# Patient Record
Sex: Male | Born: 1980 | Race: White | Hispanic: No | Marital: Married | Smoking: Current every day smoker
Health system: Southern US, Community
[De-identification: ages and names within clinical notes are randomized; demographics above are authoritative.]

## PROBLEM LIST (undated history)

## (undated) DIAGNOSIS — F1410 Cocaine abuse, uncomplicated: Secondary | ICD-10-CM

## (undated) DIAGNOSIS — Z8614 Personal history of Methicillin resistant Staphylococcus aureus infection: Secondary | ICD-10-CM

## (undated) DIAGNOSIS — K219 Gastro-esophageal reflux disease without esophagitis: Secondary | ICD-10-CM

## (undated) DIAGNOSIS — I10 Essential (primary) hypertension: Secondary | ICD-10-CM

## (undated) DIAGNOSIS — F1010 Alcohol abuse, uncomplicated: Secondary | ICD-10-CM

## (undated) DIAGNOSIS — Z7189 Other specified counseling: Secondary | ICD-10-CM

## (undated) HISTORY — DX: Gastro-esophageal reflux disease without esophagitis: K21.9

## (undated) HISTORY — PX: BAND HEMORRHOIDECTOMY: SHX1213

## (undated) HISTORY — PX: APPENDECTOMY: SHX54

## (undated) HISTORY — PX: OTHER SURGICAL HISTORY: SHX169

## (undated) HISTORY — PX: CARPAL TUNNEL RELEASE: SHX101

---

## 2014-07-20 ENCOUNTER — Emergency Department: Payer: Self-pay | Admitting: Emergency Medicine

## 2014-07-20 LAB — CBC WITH DIFFERENTIAL/PLATELET
Basophil #: 0.1 10*3/uL (ref 0.0–0.1)
Basophil %: 0.5 %
EOS PCT: 2.1 %
Eosinophil #: 0.2 10*3/uL (ref 0.0–0.7)
HCT: 42.8 % (ref 40.0–52.0)
HGB: 13.7 g/dL (ref 13.0–18.0)
LYMPHS PCT: 14.8 %
Lymphocyte #: 1.7 10*3/uL (ref 1.0–3.6)
MCH: 27.2 pg (ref 26.0–34.0)
MCHC: 32 g/dL (ref 32.0–36.0)
MCV: 85 fL (ref 80–100)
Monocyte #: 0.5 x10 3/mm (ref 0.2–1.0)
Monocyte %: 4.7 %
Neutrophil #: 8.8 10*3/uL — ABNORMAL HIGH (ref 1.4–6.5)
Neutrophil %: 77.9 %
Platelet: 184 10*3/uL (ref 150–440)
RBC: 5.03 10*6/uL (ref 4.40–5.90)
RDW: 15.1 % — ABNORMAL HIGH (ref 11.5–14.5)
WBC: 11.2 10*3/uL — ABNORMAL HIGH (ref 3.8–10.6)

## 2014-07-20 LAB — COMPREHENSIVE METABOLIC PANEL
ALBUMIN: 3.5 g/dL (ref 3.4–5.0)
ANION GAP: 10 (ref 7–16)
Alkaline Phosphatase: 88 U/L
BUN: 16 mg/dL (ref 7–18)
Bilirubin,Total: 1 mg/dL (ref 0.2–1.0)
Calcium, Total: 8.6 mg/dL (ref 8.5–10.1)
Chloride: 104 mmol/L (ref 98–107)
Co2: 24 mmol/L (ref 21–32)
Creatinine: 1 mg/dL (ref 0.60–1.30)
EGFR (Non-African Amer.): >60
Glucose: 96 mg/dL (ref 65–99)
OSMOLALITY: 277 (ref 275–301)
Potassium: 3.5 mmol/L (ref 3.5–5.1)
SGOT(AST): 38 U/L — ABNORMAL HIGH (ref 15–37)
SGPT (ALT): 40 U/L
Sodium: 138 mmol/L (ref 136–145)
Total Protein: 6.9 g/dL (ref 6.4–8.2)

## 2014-07-20 LAB — LIPASE, BLOOD: Lipase: 87 U/L (ref 73–393)

## 2014-07-20 LAB — URINALYSIS, COMPLETE
BILIRUBIN, UR: NEGATIVE
BLOOD: NEGATIVE
Bacteria: NONE SEEN
Glucose,UR: NEGATIVE mg/dL (ref 0–75)
Leukocyte Esterase: NEGATIVE
Nitrite: NEGATIVE
PH: 7 (ref 4.5–8.0)
Protein: 30
RBC,UR: 3 /HPF (ref 0–5)
Specific Gravity: 1.023 (ref 1.003–1.030)
WBC UR: <1 /HPF (ref 0–5)

## 2015-03-25 ENCOUNTER — Encounter: Payer: Self-pay | Admitting: Emergency Medicine

## 2015-03-25 ENCOUNTER — Emergency Department
Admission: EM | Admit: 2015-03-25 | Discharge: 2015-03-25 | Disposition: A | Discharge status: Home / Self Care | Payer: Medicaid Other | Attending: Emergency Medicine | Admitting: Emergency Medicine

## 2015-03-25 DIAGNOSIS — F10930 Alcohol use, unspecified with withdrawal, uncomplicated: Secondary | ICD-10-CM

## 2015-03-25 DIAGNOSIS — F10230 Alcohol dependence with withdrawal, uncomplicated: Secondary | ICD-10-CM | POA: Diagnosis present

## 2015-03-25 DIAGNOSIS — F1210 Cannabis abuse, uncomplicated: Secondary | ICD-10-CM | POA: Diagnosis not present

## 2015-03-25 DIAGNOSIS — F419 Anxiety disorder, unspecified: Secondary | ICD-10-CM | POA: Insufficient documentation

## 2015-03-25 DIAGNOSIS — Z720 Tobacco use: Secondary | ICD-10-CM | POA: Insufficient documentation

## 2015-03-25 HISTORY — DX: Alcohol abuse, uncomplicated: F10.10

## 2015-03-25 LAB — CBC WITH DIFFERENTIAL/PLATELET
Basophils Absolute: 0.1 10*3/uL (ref 0–0.1)
Basophils Relative: 1 %
Eosinophils Absolute: 0 10*3/uL (ref 0–0.7)
Eosinophils Relative: 0 %
HCT: 43.9 % (ref 40.0–52.0)
Hemoglobin: 15 g/dL (ref 13.0–18.0)
LYMPHS ABS: 1.8 10*3/uL (ref 1.0–3.6)
LYMPHS PCT: 15 %
MCH: 29.9 pg (ref 26.0–34.0)
MCHC: 34.3 g/dL (ref 32.0–36.0)
MCV: 87.2 fL (ref 80.0–100.0)
Monocytes Absolute: 0.7 10*3/uL (ref 0.2–1.0)
Monocytes Relative: 6 %
NEUTROS PCT: 78 %
Neutro Abs: 9.5 10*3/uL — ABNORMAL HIGH (ref 1.4–6.5)
Platelets: 232 10*3/uL (ref 150–440)
RBC: 5.03 MIL/uL (ref 4.40–5.90)
RDW: 16.1 % — ABNORMAL HIGH (ref 11.5–14.5)
WBC: 12.1 10*3/uL — AB (ref 3.8–10.6)

## 2015-03-25 LAB — COMPREHENSIVE METABOLIC PANEL
ALT: 39 U/L (ref 17–63)
ANION GAP: 9 (ref 5–15)
AST: 29 U/L (ref 15–41)
Albumin: 4.1 g/dL (ref 3.5–5.0)
Alkaline Phosphatase: 58 U/L (ref 38–126)
BUN: 32 mg/dL — ABNORMAL HIGH (ref 6–20)
CHLORIDE: 104 mmol/L (ref 101–111)
CO2: 26 mmol/L (ref 22–32)
Calcium: 8.9 mg/dL (ref 8.9–10.3)
Creatinine, Ser: 0.92 mg/dL (ref 0.61–1.24)
GFR calc non Af Amer: >60 mL/min (ref >60)
Glucose, Bld: 99 mg/dL (ref 65–99)
Potassium: 3.8 mmol/L (ref 3.5–5.1)
SODIUM: 139 mmol/L (ref 135–145)
Total Bilirubin: 1.2 mg/dL (ref 0.3–1.2)
Total Protein: 6.8 g/dL (ref 6.5–8.1)

## 2015-03-25 LAB — URINE DRUG SCREEN, QUALITATIVE (ARMC ONLY)
Amphetamines, Ur Screen: NOT DETECTED
Barbiturates, Ur Screen: NOT DETECTED
Benzodiazepine, Ur Scrn: NOT DETECTED
CANNABINOID 50 NG, UR ~~LOC~~: POSITIVE — AB
COCAINE METABOLITE, UR ~~LOC~~: NOT DETECTED
MDMA (ECSTASY) UR SCREEN: NOT DETECTED
Methadone Scn, Ur: NOT DETECTED
OPIATE, UR SCREEN: NOT DETECTED
PHENCYCLIDINE (PCP) UR S: NOT DETECTED
Tricyclic, Ur Screen: NOT DETECTED

## 2015-03-25 LAB — ETHANOL: Alcohol, Ethyl (B): <5 mg/dL (ref <5)

## 2015-03-25 MED ORDER — LORAZEPAM 2 MG/ML IJ SOLN: 0.0000 mg | Freq: Two times a day (BID) | INTRAMUSCULAR | Status: DC

## 2015-03-25 MED ORDER — LORAZEPAM 2 MG PO TABS
0.0000 mg | ORAL_TABLET | Freq: Four times a day (QID) | ORAL | Status: DC
  Filled 2015-03-25: qty 1

## 2015-03-25 MED ORDER — LORAZEPAM 2 MG/ML IJ SOLN: 0.0000 mg | Freq: Four times a day (QID) | INTRAMUSCULAR | Status: DC

## 2015-03-25 MED ORDER — VITAMIN B-1 100 MG PO TABS
100.0000 mg | ORAL_TABLET | Freq: Every day | ORAL | Status: DC
  Administered 2015-03-25: 100 mg via ORAL
  Filled 2015-03-25 (×2): qty 1

## 2015-03-25 MED ORDER — LORAZEPAM 2 MG PO TABS: 0.0000 mg | ORAL_TABLET | Freq: Two times a day (BID) | ORAL | Status: DC

## 2015-03-25 MED ORDER — ONDANSETRON 4 MG PO TBDP
4.0000 mg | ORAL_TABLET | Freq: Once | ORAL | Status: AC
  Administered 2015-03-25: 4 mg via ORAL
  Filled 2015-03-25: qty 1

## 2015-03-25 MED ORDER — THIAMINE HCL 100 MG/ML IJ SOLN: 100.0000 mg | Freq: Every day | INTRAMUSCULAR | Status: DC

## 2015-03-25 MED ORDER — LORAZEPAM 2 MG/ML IJ SOLN
1.0000 mg | Freq: Once | INTRAMUSCULAR | Status: AC
  Administered 2015-03-25: 1 mg via INTRAMUSCULAR
  Filled 2015-03-25: qty 1

## 2015-03-25 NOTE — ED Notes (Signed)
States history of ETOH abuse, states feels like he is ETOH withdrawel, states last drink 3 hours ago

## 2015-03-25 NOTE — Discharge Instructions (Signed)
Alcohol Withdrawal °Alcohol withdrawal happens when you normally drink alcohol a lot and suddenly stop drinking. Alcohol withdrawal symptoms can be mild to very bad. Mild withdrawal symptoms can include feeling sick to your stomach (nauseous), headache, or feeling irritable. Bad withdrawal symptoms can include shakiness, being very nervous (anxious), and not thinking clearly.  °HOME CARE °· Join an alcohol support group. °· Stay away from people or situations that make you want to drink. °· Eat a healthy diet. Eat a lot of fresh fruits, vegetables, and lean meats. °GET HELP RIGHT AWAY IF:  °· You become confused. You start to see and hear things that are not really there. °· You feel your heart beating very fast. °· You throw up (vomit) blood or cannot stop throwing up. This may be bright red or look like black coffee grounds. °· You have blood in your poop (stool). This may be bright red, maroon colored, or black and tarry. °· You are lightheaded or pass out (faint). °· You develop a fever. °MAKE SURE YOU:  °· Understand these instructions. °· Will watch your condition. °· Will get help right away if you are not doing well or get worse. °Document Released: 12/11/2007 Document Revised: 09/16/2011 Document Reviewed: 12/11/2007 °ExitCare® Patient Information ©2015 [employer]. This information is not intended to replace advice given to you by your health care provider. Make sure you discuss any questions you have with your health care provider. ° °

## 2015-03-25 NOTE — ED Provider Notes (Signed)
[facility] Emergency Department Provider Note  Time seen: 3:53 PM  I have reviewed the triage vital signs and the nursing notes.   HISTORY  Chief Complaint Anxiety    HPI Guy Rodriguez is a 34 y.o. male with a past medical history of alcohol abuse who presents the emergency room department requesting alcohol detox. According to the patient he drinks one fifth of vodka per day. His last drink was earlier this morning. Patient states he feels very shaky, tremulous, anxious, nauseated. Denies any history of seizures or complicated withdrawal. Patient states he has never tried inpatient detox but he states his alcoholism has put strains on his family relationships and he is hoping to detox off alcohol. He admits cannabinoids use, but denies any other substances. No medical complaints currently besides nausea. Denies abdominal or chest pain.    Past Medical History  Diagnosis Date  . ETOH abuse     There are no active problems to display for this patient.   Past Surgical History  Procedure Laterality Date  . Appendectomy      No current outpatient prescriptions on file.  Allergies Review of patient's allergies indicates no known allergies.  No family history on file.  Social History Social History  Substance Use Topics  . Smoking status: Current Every Day Smoker -- 0.50 packs/day    Types: Cigarettes  . Smokeless tobacco: None  . Alcohol Use: Yes    Review of Systems Constitutional: Negative for fever. Cardiovascular: Negative for chest pain. Respiratory: Negative for shortness of breath. Gastrointestinal: Negative for abdominal pain. Positive for nausea. Genitourinary: Negative for dysuria. Neurological: Negative for headache 10-point ROS otherwise negative.  ____________________________________________   PHYSICAL EXAM:  VITAL SIGNS: ED Triage Vitals  Enc Vitals Group     BP 03/25/15 1404 139/107 mmHg     Pulse Rate 03/25/15 1404  86     Resp 03/25/15 1404 20     Temp 03/25/15 1404 97.8 F (36.6 C)     Temp Source 03/25/15 1404 Oral     SpO2 03/25/15 1404 99 %     Weight 03/25/15 1404 195 lb (88.451 kg)     Height 03/25/15 1404  (1.854 m)     Head Cir --      Peak Flow --      Pain Score 03/25/15 1405 0     Pain Loc --      Pain Edu? --      Excl. in GC? --     Constitutional: Alert and oriented. Does appear anxious. Eyes: Normal exam ENT   Head: Normocephalic and atraumatic Cardiovascular: Normal rate, regular rhythm. No murmur Respiratory: Normal respiratory effort without tachypnea nor retractions. Breath sounds are clear  Gastrointestinal: Soft and nontender. No distention.   Musculoskeletal: Nontender with normal range of motion in all extremities. Neurologic:  Normal speech and language. No gross focal neurologic deficits  Skin:  Skin is warm, dry and intact.  Psychiatric: Moderately anxious appearing.     INITIAL IMPRESSION / ASSESSMENT AND PLAN / ED COURSE  Pertinent labs & imaging results that were available during my care of the patient were reviewed by me and considered in my medical decision making (see chart for details).  Patient presents for alcohol detox last withdrawal. States nausea but denies any other medical complaints. We will treat nausea, place on CIWA protocol and monitor closely in the emergency department. I have consult did behavioral health to evaluate for possible detox. Labs are  largely within normal limits.  Behavioral health has arranged for the patient to go to [facility] for detox. Patient is agreeable. Labs are largely within normal limits. Alcohol is negative. We'll discharge the patient to [facility] continued detox and treatment.  ____________________________________________   FINAL CLINICAL IMPRESSION(S) / ED DIAGNOSES  Alcohol withdrawal   Minna Antis, MD 03/25/15 2030

## 2015-03-25 NOTE — BH Assessment (Signed)
Assessment Note  Guy Rodriguez is an 34 y.o. male. Patient reports coming into the ED requesting alcohol detox.  Patient currently denies suicidal/homicidal ideations, hallucinations, and other self-injurious behaviors.  Patient reports drinking alcohol daily, fifth to a liter of vodka or 3-4 Locos, started at age 71 but this daily use for the past 3 years, last used 9/16. Patient reports smoking marijuana, couple times a month, 1 blunt, since the age of 75, and last used 2 days ago.  The current withdrawal symptoms are shakes, sweats, nausea, vomiting, dizzie, numbness, anxious, diarrhea, and restlessness.  Patient denies history of dt's and seizures.    This Clinical research associate consulted with Dr. Lenard Lance it was recommended to refer for inpatient detox.  This Clinical research associate faxed referral to [facility] for review and spoke with Alinda Money, Charity fundraiser.    Axis I: Alcohol use, severe Axis II: Deferred Axis III:  Past Medical History  Diagnosis Date  . ETOH abuse    Axis IV: economic problems, housing problems, occupational problems, other psychosocial or environmental problems, problems related to social environment, problems with access to health care services and problems with primary support group Axis V: 51-60 moderate symptoms  Past Medical History:  Past Medical History  Diagnosis Date  . ETOH abuse     Past Surgical History  Procedure Laterality Date  . Appendectomy      Family History: No family history on file.  Social History:  reports that he has been smoking Cigarettes.  He has been smoking about 0.50 packs per day. He does not have any smokeless tobacco history on file. He reports that he drinks alcohol. His drug history is not on file.  Additional Social History:  Alcohol / Drug Use Pain Medications: See MARs Prescriptions: See MARs Over the Counter: See MARs History of alcohol / drug use?: Yes Longest period of sobriety (when/how long): couple weeks Negative Consequences of Use: Financial, Legal,  Personal relationships, Work / School Withdrawal Symptoms: Diarrhea, Sweats, Fever / Chills, Tingling, Tremors, Irritability, Nausea / Vomiting, Weakness, Patient aware of relationship between substance abuse and physical/medical complications Substance #1 Name of Substance 1: Alcohol 1 - Age of First Use: 12 1 - Amount (size/oz): fifth or liter 1 - Frequency: daily 1 - Duration: 2-3 years 1 - Last Use / Amount: 9/16 Substance #2 Name of Substance 2: Marijuana 2 - Age of First Use: 12 2 - Amount (size/oz): blunt 2 - Frequency: 2 or more a month 2 - Duration: on/off since 12 2 - Last Use / Amount: 2 days ago  CIWA: CIWA-Ar BP: 128/87 mmHg Pulse Rate: 80 Nausea and Vomiting: 3 Tactile Disturbances: moderate itching, pins and needles, burning or numbness Tremor: three Auditory Disturbances: not present Paroxysmal Sweats: barely perceptible sweating, palms moist Visual Disturbances: very mild sensitivity Anxiety: mildly anxious Headache, Fullness in Head: very mild Agitation: normal activity Orientation and Clouding of Sensorium: cannot do serial additions or is uncertain about date CIWA-Ar Total: 14 COWS:    Allergies: No Known Allergies  Home Medications:  (Not in a hospital admission)  OB/GYN Status:  No LMP for male patient.  General Assessment Data Location of Assessment: [facility] ED [facility] Assessment: In system Is this a Tele or Face-to-Face Assessment?: Face-to-Face Is this an Initial Assessment or a Re-assessment for this encounter?: Initial Assessment Marital status: Married [place] name: na Is patient pregnant?: No Pregnancy Status: No Living Arrangements: Other (Comment) (homeless) Can pt return to current living arrangement?: Yes Admission Status: Voluntary Is patient capable of signing  voluntary admission?: Yes Referral Source: Self/Family/Friend Insurance type: medicaid  Medical Screening Exam [facility] Walk-in ONLY) Medical Exam completed: Yes  Crisis Care  Plan Living Arrangements: Other (Comment) (homeless) Name of Psychiatrist: none Name of Therapist: none  Education Status Is patient currently in school?: No Current Grade: na Highest grade of school patient has completed: na Name of school: na Contact person: na  Risk to self with the past 6 months Suicidal Ideation: No-Not Currently/Within Last 6 Months Has patient been a risk to self within the past 6 months prior to admission? : No Suicidal Intent: No-Not Currently/Within Last 6 Months Has patient had any suicidal intent within the past 6 months prior to admission? : No Is patient at risk for suicide?: No Suicidal Plan?: No-Not Currently/Within Last 6 Months Has patient had any suicidal plan within the past 6 months prior to admission? : No Access to Means: No What has been your use of drugs/alcohol within the last 12 months?: alcohol, marijuana Previous Attempts/Gestures: No How many times?: 0 Intentional Self Injurious Behavior: None Family Suicide History: No Recent stressful life event(s): Conflict (Comment), Loss (Comment), Job Loss, Financial Problems, Legal Issues, Other (Comment) Persecutory voices/beliefs?: No Depression Symptoms: Loss of interest in usual pleasures, Guilt, Feeling worthless/self pity Substance abuse history and/or treatment for substance abuse?: Yes  Risk to Others within the past 6 months Homicidal Ideation: No-Not Currently/Within Last 6 Months Does patient have any lifetime risk of violence toward others beyond the six months prior to admission? : No Thoughts of Harm to Others: No-Not Currently Present/Within Last 6 Months Current Homicidal Intent: No-Not Currently/Within Last 6 Months Current Homicidal Plan: No-Not Currently/Within Last 6 Months Access to Homicidal Means: No Identified Victim: na History of harm to others?: No Assessment of Violence: None Noted Violent Behavior Description: na Does patient have access to weapons?:  No Criminal Charges Pending?: No Does patient have a court date: No Is patient on probation?: No  Psychosis Hallucinations: None noted Delusions: None noted  Mental Status Report Appearance/Hygiene: In hospital gown Eye Contact: Fair Motor Activity: Freedom of movement Speech: Slow, Slurred Level of Consciousness: Alert Mood: Pleasant Affect: Appropriate to circumstance Anxiety Level: Minimal Thought Processes: Relevant Judgement: Unimpaired Orientation: Person, Place, Time, Situation Obsessive Compulsive Thoughts/Behaviors: None  Cognitive Functioning Concentration: Fair Memory: Recent Intact, Remote Intact IQ: Average Insight: Fair Impulse Control: Fair Appetite: Poor Weight Loss: 0 Weight Gain: 0 Sleep: No Change Vegetative Symptoms: None  ADLScreening [facility] Assessment Services) Patient's cognitive ability adequate to safely complete daily activities?: Yes Patient able to express need for assistance with ADLs?: Yes Independently performs ADLs?: Yes (appropriate for developmental age)  Prior Inpatient Therapy Prior Inpatient Therapy: No Prior Therapy Dates: na  Prior Outpatient Therapy Prior Outpatient Therapy: No Does patient have an ACCT team?: No Does patient have Intensive In-House Services?  : No Does patient have [facility] services? : No Does patient have P4CC services?: No  ADL Screening (condition at time of admission) Patient's cognitive ability adequate to safely complete daily activities?: Yes Patient able to express need for assistance with ADLs?: Yes Independently performs ADLs?: Yes (appropriate for developmental age)       Abuse/Neglect Assessment (Assessment to be complete while patient is alone) Physical Abuse: Denies Verbal Abuse: Denies Sexual Abuse: Denies Exploitation of patient/patient's resources: Denies Self-Neglect: Denies Values / Beliefs Cultural Requests During Hospitalization: None Spiritual Requests During  Hospitalization: None Consults Spiritual Care Consult Needed: No Social Work Consult Needed: No Merchant navy officer (For Healthcare) Does patient  have an advance directive?: No Would patient like information on creating an advanced directive?: No - patient declined information    Additional Information 1:1 In Past 12 Months?: No CIRT Risk: No Elopement Risk: No Does patient have medical clearance?: Yes     Disposition:  Disposition Initial Assessment Completed for this Encounter: Yes Disposition of Patient: Inpatient treatment program Type of inpatient treatment program: Adult  On Site Evaluation by:   Reviewed with Physician:    Maryelizabeth Rowan A 03/25/2015 6:41 PM

## 2015-03-25 NOTE — Progress Notes (Signed)
This Clinical research associate spoke with Alinda Money, RN who has confirmed the patient has been accepted and will be transported as soon as possible.  The nursing staff was informed that [facility] will contact when transportation is confirmed.     Maryelizabeth Rowan, MSW, [facility] Triage Specialist [phone]

## 2015-08-05 ENCOUNTER — Encounter: Payer: Self-pay | Admitting: Emergency Medicine

## 2015-08-05 ENCOUNTER — Ambulatory Visit
Admission: EM | Admit: 2015-08-05 | Discharge: 2015-08-05 | Disposition: A | Discharge status: Home / Self Care | Payer: Medicaid Other | Attending: Family Medicine | Admitting: Family Medicine

## 2015-08-05 ENCOUNTER — Ambulatory Visit: Payer: Medicaid Other

## 2015-08-05 DIAGNOSIS — Z7982 Long term (current) use of aspirin: Secondary | ICD-10-CM | POA: Insufficient documentation

## 2015-08-05 DIAGNOSIS — F17210 Nicotine dependence, cigarettes, uncomplicated: Secondary | ICD-10-CM | POA: Insufficient documentation

## 2015-08-05 DIAGNOSIS — M940 Chondrocostal junction syndrome [Tietze]: Secondary | ICD-10-CM | POA: Insufficient documentation

## 2015-08-05 DIAGNOSIS — R079 Chest pain, unspecified: Secondary | ICD-10-CM | POA: Insufficient documentation

## 2015-08-05 DIAGNOSIS — I10 Essential (primary) hypertension: Secondary | ICD-10-CM | POA: Diagnosis not present

## 2015-08-05 DIAGNOSIS — R0789 Other chest pain: Secondary | ICD-10-CM | POA: Diagnosis not present

## 2015-08-05 HISTORY — DX: Other specified counseling: Z71.89

## 2015-08-05 HISTORY — DX: Essential (primary) hypertension: I10

## 2015-08-05 LAB — TROPONIN I: Troponin I: <0.03 ng/mL (ref <0.031)

## 2015-08-05 LAB — COMPREHENSIVE METABOLIC PANEL
ALK PHOS: 69 U/L (ref 38–126)
ALT: 35 U/L (ref 17–63)
ANION GAP: 11 (ref 5–15)
AST: 30 U/L (ref 15–41)
Albumin: 4.6 g/dL (ref 3.5–5.0)
BILIRUBIN TOTAL: 1.9 mg/dL — AB (ref 0.3–1.2)
BUN: 20 mg/dL (ref 6–20)
CALCIUM: 9.2 mg/dL (ref 8.9–10.3)
CO2: 24 mmol/L (ref 22–32)
CREATININE: 0.99 mg/dL (ref 0.61–1.24)
Chloride: 99 mmol/L — ABNORMAL LOW (ref 101–111)
GFR calc non Af Amer: >60 mL/min (ref >60)
Glucose, Bld: 112 mg/dL — ABNORMAL HIGH (ref 65–99)
Potassium: 4.1 mmol/L (ref 3.5–5.1)
Sodium: 134 mmol/L — ABNORMAL LOW (ref 135–145)
TOTAL PROTEIN: 7.9 g/dL (ref 6.5–8.1)

## 2015-08-05 LAB — CBC WITH DIFFERENTIAL/PLATELET
Basophils Absolute: 0.1 10*3/uL (ref 0–0.1)
Basophils Relative: 1 %
EOS ABS: 0.2 10*3/uL (ref 0–0.7)
Eosinophils Relative: 3 %
HEMATOCRIT: 46.7 % (ref 40.0–52.0)
HEMOGLOBIN: 16.3 g/dL (ref 13.0–18.0)
LYMPHS ABS: 1.9 10*3/uL (ref 1.0–3.6)
LYMPHS PCT: 31 %
MCH: 31.5 pg (ref 26.0–34.0)
MCHC: 34.9 g/dL (ref 32.0–36.0)
MCV: 90.4 fL (ref 80.0–100.0)
MONOS PCT: 10 %
Monocytes Absolute: 0.6 10*3/uL (ref 0.2–1.0)
NEUTROS ABS: 3.3 10*3/uL (ref 1.4–6.5)
NEUTROS PCT: 55 %
Platelets: 208 10*3/uL (ref 150–440)
RBC: 5.17 MIL/uL (ref 4.40–5.90)
RDW: 13.9 % (ref 11.5–14.5)
WBC: 6.1 10*3/uL (ref 3.8–10.6)

## 2015-08-05 LAB — CK: Total CK: 102 U/L (ref 49–397)

## 2015-08-05 LAB — CKMB (ARMC ONLY): CK, MB: 1.3 ng/mL (ref 0.5–5.0)

## 2015-08-05 MED ORDER — MELOXICAM 15 MG PO TABS: 15.0000 mg | ORAL_TABLET | Freq: Every day | ORAL | Status: DC

## 2015-08-05 MED ORDER — KETOROLAC TROMETHAMINE 60 MG/2ML IM SOLN
60.0000 mg | Freq: Once | INTRAMUSCULAR | Status: AC
  Administered 2015-08-05: 60 mg via INTRAMUSCULAR

## 2015-08-05 NOTE — ED Notes (Signed)
Rib pain, shoulder pain, back pain all on left side for 7 days. Worst in pass 3 days

## 2015-08-05 NOTE — ED Provider Notes (Signed)
CSN: [id number]     Arrival date & time 08/05/15  1152 History   First MD Initiated Contact with Patient 08/05/15 1256     Nurses notes were reviewed. Chief Complaint  Patient presents with  . Chest Pain  . Shoulder Pain   Patient reports having extreme chest pain. Chest pain but on the left side left lower ribs. He reports pain becomes worse when he takes a deep breath. It was the pain has been there since Monday is taking better fitting things getting worse. He denies any injury to his left side of the left ribs but reports the pain is more intense. He is diet any type of injury. He is aware of and denies any type of extreme activity because he is out of work at this time. He does smoke and was warned he needs to stop smoking. He is receiving started Chantix and his wife was concerned that that may have been causing his pain on the left side. I have explained to them this in usual for the Chantix only affect a slight amount of area and not the lungs chest. He denies any type of URI. No history of asthma or previous lung problem.  He does have strong family history of heart disease but denies any history of hypertension. One of his parents had her first MI in the late 28s.        (Consider location/radiation/quality/duration/timing/severity/associated sxs/prior Treatment) Patient is a 35 y.o. male presenting with chest pain and shoulder pain. The history is provided by the patient and the spouse. No language interpreter was used.  Chest Pain Pain location:  L chest and L lateral chest Pain quality: sharp, shooting and stabbing   Pain quality: not radiating   Pain radiates to:  Does not radiate Pain radiates to the back: no   Pain severity:  Severe Duration:  6 days Timing:  Constant Progression:  Worsening Chronicity:  New Context: breathing, movement and raising an arm   Relieved by:  Nothing Worsened by:  Certain positions Ineffective treatments:  Certain positions Associated  symptoms: back pain   Associated symptoms: no cough, no diaphoresis, no dizziness, no dysphagia, no fatigue, no fever, no heartburn, no lower extremity edema and no shortness of breath   Risk factors: male sex and smoking   Risk factors: no prior DVT/PE   Shoulder Pain Associated symptoms: back pain   Associated symptoms: no fatigue and no fever     Past Medical History  Diagnosis Date  . ETOH abuse   . Hypertension   . History of participation in smoking cessation counseling    Past Surgical History  Procedure Laterality Date  . Appendectomy     No family history on file. Social History  Substance Use Topics  . Smoking status: Current Every Day Smoker -- 0.50 packs/day    Types: Cigarettes  . Smokeless tobacco: None  . Alcohol Use: Yes    Review of Systems  Constitutional: Negative for fever, diaphoresis and fatigue.  HENT: Negative for trouble swallowing.   Respiratory: Negative for cough and shortness of breath.   Cardiovascular: Positive for chest pain.  Gastrointestinal: Negative for heartburn.  Musculoskeletal: Positive for back pain.  Neurological: Negative for dizziness.    Allergies  Review of patient's allergies indicates no known allergies.  Home Medications   Prior to Admission medications   Medication Sig Start Date End Date Taking? Authorizing Provider  amoxicillin (AMOXIL) 500 MG tablet Take 500 mg by mouth 2 (two)  times daily.   Yes Historical Provider, MD  gabapentin (NEURONTIN) 600 MG tablet Take 600 mg by mouth 3 (three) times daily.   Yes Historical Provider, MD  lisinopril (PRINIVIL,ZESTRIL) 10 MG tablet Take 10 mg by mouth daily.   Yes Historical Provider, MD  varenicline (CHANTIX PAK) 0.5 MG X 11 & 1 MG X 42 tablet Take by mouth 2 (two) times daily. Take one 0.5 mg tablet by mouth once daily for 3 days, then increase to one 0.5 mg tablet twice daily for 4 days, then increase to one 1 mg tablet twice daily.   Yes Historical Provider, MD  aspirin  EC 81 MG tablet Take 81 mg by mouth daily.    Historical Provider, MD   Meds Ordered and Administered this Visit  Medications - No data to display  BP 145/92 mmHg  Pulse 80  Temp(Src) 98.6 F (37 C) (Tympanic)  Resp 18  Ht  (1.854 m)  Wt 210 lb (95.255 kg)  BMI 27.71 kg/m2  SpO2 97% No data found.   Physical Exam  Constitutional: He is oriented to person, place, and time. He appears well-developed and well-nourished.  HENT:  Head: Normocephalic and atraumatic.  Eyes: Conjunctivae are normal. Pupils are equal, round, and reactive to light.  Neck: Normal range of motion. Neck supple.  Cardiovascular: Normal rate, regular rhythm and normal heart sounds.   Pulmonary/Chest: Effort normal and breath sounds normal. No respiratory distress. He exhibits tenderness and bony tenderness.    Patient with marked tenderness over the left chest wall especially lower left ribs and increase tenderness as we approach the axillary area  Abdominal: Soft. Normal appearance and bowel sounds are normal. There is no hepatosplenomegaly. There is tenderness. There is no CVA tenderness. No hernia.    Tenderness over the abdominal area is really over the left lower ribs.  Musculoskeletal: Normal range of motion. He exhibits tenderness. He exhibits no edema.  Neurological: He is alert and oriented to person, place, and time.  Skin: Skin is warm and dry.  Psychiatric: He has a normal mood and affect.  Vitals reviewed.   ED Course  Procedures (including critical care time)  Labs Review Labs Reviewed  COMPREHENSIVE METABOLIC PANEL - Abnormal; Notable for the following:    Sodium 134 (*)    Chloride 99 (*)    Glucose, Bld 112 (*)    Total Bilirubin 1.9 (*)    All other components within normal limits  TROPONIN I  CBC WITH DIFFERENTIAL/PLATELET  CK  CKMB([facility] ONLY)    Imaging Review Dg Ribs Unilateral W/chest Left  08/05/2015  CLINICAL DATA:  Left lower rib pain for 1 week. No known  trauma. Current smoker. EXAM: LEFT RIBS AND CHEST - 3+ VIEW COMPARISON:  None. FINDINGS: Normal cardiac silhouette and mediastinal contours. No focal parenchymal opacities. No pleural effusion or pneumothorax. No evidence of edema. No acute osseous abnormalities with special attention paid to the area demarcated by the radiopaque BB. IMPRESSION: 1.  No acute cardiopulmonary disease. 2. No displaced rib fractures with special attention paid to the area demarcated by the radiopaque BB. Electronically Signed   By: Simonne Come M.D.   On: 08/05/2015 13:56     Visual Acuity Review  Right Eye Distance:   Left Eye Distance:   Bilateral Distance:    Right Eye Near:   Left Eye Near:    Bilateral Near:         MDM   1. Costochondritis, acute  2. Acute chest wall pain      Results for orders placed or performed during the hospital encounter of 08/05/15  Troponin I  Result Value Ref Range   Troponin I <0.03 <0.031 ng/mL  CBC with Differential  Result Value Ref Range   WBC 6.1 3.8 - 10.6 K/uL   RBC 5.17 4.40 - 5.90 MIL/uL   Hemoglobin 16.3 13.0 - 18.0 g/dL   HCT 16.1 09.6 - 04.5 %   MCV 90.4 80.0 - 100.0 fL   MCH 31.5 26.0 - 34.0 pg   MCHC 34.9 32.0 - 36.0 g/dL   RDW 40.9 81.1 - 91.4 %   Platelets 208 150 - 440 K/uL   Neutrophils Relative % 55 %   Neutro Abs 3.3 1.4 - 6.5 K/uL   Lymphocytes Relative 31 %   Lymphs Abs 1.9 1.0 - 3.6 K/uL   Monocytes Relative 10 %   Monocytes Absolute 0.6 0.2 - 1.0 K/uL   Eosinophils Relative 3 %   Eosinophils Absolute 0.2 0 - 0.7 K/uL   Basophils Relative 1 %   Basophils Absolute 0.1 0 - 0.1 K/uL  Comprehensive metabolic panel  Result Value Ref Range   Sodium 134 (L) 135 - 145 mmol/L   Potassium 4.1 3.5 - 5.1 mmol/L   Chloride 99 (L) 101 - 111 mmol/L   CO2 24 22 - 32 mmol/L   Glucose, Bld 112 (H) 65 - 99 mg/dL   BUN 20 6 - 20 mg/dL   Creatinine, Ser 7.82 0.61 - 1.24 mg/dL   Calcium 9.2 8.9 - 95.6 mg/dL   Total Protein 7.9 6.5 - 8.1 g/dL    Albumin 4.6 3.5 - 5.0 g/dL   AST 30 15 - 41 U/L   ALT 35 17 - 63 U/L   Alkaline Phosphatase 69 38 - 126 U/L   Total Bilirubin 1.9 (H) 0.3 - 1.2 mg/dL   GFR calc non Af Amer >60 >60 mL/min   GFR calc Af Amer >60 >60 mL/min   Anion gap 11 5 - 15  CK  Result Value Ref Range   Total CK 102 49 - 397 U/L  CKMB([facility] only)  Result Value Ref Range   CK, MB 1.3 0.5 - 5.0 ng/mL   Patient given 6 mg of Toradol IM. Chest pain appears to be chest wall pain with tenderness of the left ribs. At this time place him on Mobic 15 mg. We'll also obtain cardiac enzymes on him. Cardiac enzymes chest x-ray negative Him to go home on the Mobic 15 mg daily. PCP of choice in 2 weeks not better. May need to get stress test in the near future chest pain continues     Note: This dictation was prepared with Dragon dictation along with smaller phrase technology. Any transcriptional errors that result from this process are unintentional.  [name], MD 08/05/15 1459

## 2015-08-05 NOTE — Discharge Instructions (Signed)
Chest Wall Pain °Chest wall pain is pain in or around the bones and muscles of your chest. Sometimes, an injury causes this pain. Sometimes, the cause may not be known. This pain may take several weeks or longer to get better. °HOME CARE °Pay attention to any changes in your symptoms. Take these actions to help with your pain: °· Rest as told by your doctor. °· Avoid activities that cause pain. Try not to use your chest, belly (abdominal), or side muscles to lift heavy things. °· If directed, apply ice to the painful area: °¨ Put ice in a plastic bag. °¨ Place a towel between your skin and the bag. °¨ Leave the ice on for 20 minutes, 2-3 times per day. °· Take over-the-counter and prescription medicines only as told by your doctor. °· Do not use tobacco products, including cigarettes, chewing tobacco, and e-cigarettes. If you need help quitting, ask your doctor. °· Keep all follow-up visits as told by your doctor. This is important. °GET HELP IF: °· You have a fever. °· Your chest pain gets worse. °· You have new symptoms. °GET HELP RIGHT AWAY IF: °· You feel sick to your stomach (nauseous) or you throw up (vomit). °· You feel sweaty or light-headed. °· You have a cough with phlegm (sputum) or you cough up blood. °· You are short of breath. °  °This information is not intended to replace advice given to you by your health care provider. Make sure you discuss any questions you have with your health care provider. °  °Document Released: 12/11/2007 Document Revised: 03/15/2015 Document Reviewed: 09/19/2014 °Elsevier Interactive Patient Education ©2016 [employer] ° °Costochondritis °Costochondritis is a condition in which the tissue (cartilage) that connects your ribs with your breastbone (sternum) becomes irritated. It causes pain in the chest and rib area. It usually goes away on its own over time. °HOME CARE °· Avoid activities that wear you out. °· Do not strain your ribs. Avoid activities that use  your: °¨ Chest. °¨ Belly. °¨ Side muscles. °· Put ice on the area for the first 2 days after the pain starts. °¨ Put ice in a plastic bag. °¨ Place a towel between your skin and the bag. °¨ Leave the ice on for 20 minutes, 2-3 times a day. °· Only take medicine as told by your doctor. °GET HELP IF: °· You have redness or puffiness (swelling) in the rib area. °· Your pain does not go away with rest or medicine. °GET HELP RIGHT AWAY IF:  °· Your pain gets worse. °· You are very uncomfortable. °· You have trouble breathing. °· You cough up blood. °· You start sweating or throwing up (vomiting). °· You have a fever or lasting symptoms for more than 2-3 days. °· You have a fever and your symptoms suddenly get worse. °MAKE SURE YOU:  °· Understand these instructions. °· Will watch your condition. °· Will get help right away if you are not doing well or get worse. °  °This information is not intended to replace advice given to you by your health care provider. Make sure you discuss any questions you have with your health care provider. °  °Document Released: 12/11/2007 Document Revised: 02/24/2013 Document Reviewed: 01/26/2013 °Elsevier Interactive Patient Education ©2016 [employer] ° °

## 2015-10-12 ENCOUNTER — Ambulatory Visit: Payer: Medicaid Other

## 2015-10-12 ENCOUNTER — Encounter: Payer: Self-pay | Admitting: Emergency Medicine

## 2015-10-12 ENCOUNTER — Ambulatory Visit
Admission: EM | Admit: 2015-10-12 | Discharge: 2015-10-12 | Disposition: A | Discharge status: Home / Self Care | Payer: Medicaid Other | Attending: Family Medicine | Admitting: Family Medicine

## 2015-10-12 DIAGNOSIS — R0781 Pleurodynia: Secondary | ICD-10-CM | POA: Diagnosis not present

## 2015-10-12 DIAGNOSIS — F17210 Nicotine dependence, cigarettes, uncomplicated: Secondary | ICD-10-CM | POA: Insufficient documentation

## 2015-10-12 DIAGNOSIS — M940 Chondrocostal junction syndrome [Tietze]: Secondary | ICD-10-CM | POA: Diagnosis not present

## 2015-10-12 DIAGNOSIS — Z79899 Other long term (current) drug therapy: Secondary | ICD-10-CM | POA: Insufficient documentation

## 2015-10-12 DIAGNOSIS — W010XXA Fall on same level from slipping, tripping and stumbling without subsequent striking against object, initial encounter: Secondary | ICD-10-CM | POA: Diagnosis not present

## 2015-10-12 DIAGNOSIS — Z7982 Long term (current) use of aspirin: Secondary | ICD-10-CM | POA: Diagnosis not present

## 2015-10-12 DIAGNOSIS — F1010 Alcohol abuse, uncomplicated: Secondary | ICD-10-CM | POA: Diagnosis not present

## 2015-10-12 DIAGNOSIS — Z9889 Other specified postprocedural states: Secondary | ICD-10-CM | POA: Insufficient documentation

## 2015-10-12 DIAGNOSIS — I10 Essential (primary) hypertension: Secondary | ICD-10-CM | POA: Insufficient documentation

## 2015-10-12 DIAGNOSIS — S20212A Contusion of left front wall of thorax, initial encounter: Secondary | ICD-10-CM | POA: Insufficient documentation

## 2015-10-12 MED ORDER — KETOROLAC TROMETHAMINE 60 MG/2ML IM SOLN
60.0000 mg | Freq: Once | INTRAMUSCULAR | Status: AC
  Administered 2015-10-12: 60 mg via INTRAMUSCULAR

## 2015-10-12 MED ORDER — HYDROCODONE-ACETAMINOPHEN 5-325 MG PO TABS: 1.0000 | ORAL_TABLET | Freq: Four times a day (QID) | ORAL | Status: DC | PRN

## 2015-10-12 NOTE — Discharge Instructions (Signed)
Rib Contusion A rib contusion is a deep bruise on your rib area. Contusions are the result of a blunt trauma that causes bleeding and injury to the tissues under the skin. A rib contusion may involve bruising of the ribs and of the skin and muscles in the area. The skin overlying the contusion may turn blue, purple, or yellow. Minor injuries will give you a painless contusion, but more severe contusions may stay painful and swollen for a few weeks. CAUSES  A contusion is usually caused by a blow, trauma, or direct force to an area of the body. This often occurs while playing contact sports. SYMPTOMS  Swelling and redness of the injured area.  Discoloration of the injured area.  Tenderness and soreness of the injured area.  Pain with or without movement. DIAGNOSIS  The diagnosis can be made by taking a medical history and performing a physical exam. An X-ray, CT scan, or MRI may be needed to determine if there were any associated injuries, such as broken bones (fractures) or internal injuries. TREATMENT  Often, the best treatment for a rib contusion is rest. Icing or applying cold compresses to the injured area may help reduce swelling and inflammation. Deep breathing exercises may be recommended to reduce the risk of partial lung collapse and pneumonia. Over-the-counter or prescription medicines may also be recommended for pain control. HOME CARE INSTRUCTIONS   Apply ice to the injured area:  Put ice in a plastic bag.  Place a towel between your skin and the bag.  Leave the ice on for 20 minutes, 2-3 times per day.  Take medicines only as directed by your health care provider.  Rest the injured area. Avoid strenuous activity and any activities or movements that cause pain. Be careful during activities and avoid bumping the injured area.  Perform deep-breathing exercises as directed by your health care provider.  Do not lift anything that is heavier than 5 lb (2.3 kg) until your  health care provider approves.  Do not use any tobacco products, including cigarettes, chewing tobacco, or electronic cigarettes. If you need help quitting, ask your health care provider. SEEK MEDICAL CARE IF:   You have increased bruising or swelling.  You have pain that is not controlled with treatment.  You have a fever. SEEK IMMEDIATE MEDICAL CARE IF:   You have difficulty breathing or shortness of breath.  You develop a continual cough, or you cough up thick or bloody sputum.  You feel sick to your stomach (nauseous), you throw up (vomit), or you have abdominal pain.   This information is not intended to replace advice given to you by your health care provider. Make sure you discuss any questions you have with your health care provider.   Document Released: 03/19/2001 Document Revised: 07/15/2014 Document Reviewed: 04/05/2014 Elsevier Interactive Patient Education 2016 [employer] Inc[employer]  

## 2015-10-12 NOTE — ED Provider Notes (Signed)
CSN: [id number]     Arrival date & time 10/12/15  1015 History   First MD Initiated Contact with Patient 10/12/15 1214     Chief Complaint  Patient presents with  . Rib Pain   . Fall   (Consider location/radiation/quality/duration/timing/severity/associated sxs/prior Treatment) HPI  This is a 35 year old male who presents with bilateral rib pain after slipping on his wet deck yesterday. He was ascending the stairs when he slipped and fell against the railing . He has a large bruise extending on his lower ribs on the left side over into the right anterior ribs. He denies any shortness of breath or coughing blood or sputum. States any motion is very painful and deep breathing is also painful. He was last seen here at the clinic with a costochondritis on the left was nontraumatic.      Past Medical History  Diagnosis Date  . ETOH abuse   . Hypertension   . History of participation in smoking cessation counseling    Past Surgical History  Procedure Laterality Date  . Appendectomy    . Clavicale surgery Left    History reviewed. No pertinent family history. Social History  Substance Use Topics  . Smoking status: Current Every Day Smoker -- 0.50 packs/day    Types: Cigarettes  . Smokeless tobacco: None  . Alcohol Use: Yes    Review of Systems  Constitutional: Positive for activity change. Negative for fever, chills and fatigue.  Respiratory: Negative for cough and shortness of breath.   Cardiovascular: Positive for chest pain.  Skin: Positive for color change.  All other systems reviewed and are negative.   Allergies  Review of patient's allergies indicates no known allergies.  Home Medications   Prior to Admission medications   Medication Sig Start Date End Date Taking? Authorizing Provider  escitalopram (LEXAPRO) 10 MG tablet Take 10 mg by mouth daily.   Yes Historical Provider, MD  ranitidine (ZANTAC) 300 MG tablet Take 300 mg by mouth 2 (two) times daily.   Yes  Historical Provider, MD  amoxicillin (AMOXIL) 500 MG tablet Take 500 mg by mouth 2 (two) times daily.    Historical Provider, MD  aspirin EC 81 MG tablet Take 81 mg by mouth daily.    Historical Provider, MD  gabapentin (NEURONTIN) 600 MG tablet Take 600 mg by mouth 3 (three) times daily.    Historical Provider, MD  HYDROcodone-acetaminophen (NORCO/VICODIN) 5-325 MG tablet Take 1-2 tablets by mouth every 6 (six) hours as needed. 10/12/15   Lutricia Feil, PA-C  lisinopril (PRINIVIL,ZESTRIL) 10 MG tablet Take 10 mg by mouth daily.    Historical Provider, MD  meloxicam (MOBIC) 15 MG tablet Take 1 tablet (15 mg total) by mouth daily. 08/05/15   [name], MD  varenicline (CHANTIX PAK) 0.5 MG X 11 & 1 MG X 42 tablet Take by mouth 2 (two) times daily. Take one 0.5 mg tablet by mouth once daily for 3 days, then increase to one 0.5 mg tablet twice daily for 4 days, then increase to one 1 mg tablet twice daily.    Historical Provider, MD   Meds Ordered and Administered this Visit   Medications  ketorolac (TORADOL) injection 60 mg (60 mg Intramuscular Given 10/12/15 1233)    BP 141/98 mmHg  Pulse 84  Temp(Src) 98 F (36.7 C) (Tympanic)  Resp 16  Ht  (1.854 m)  Wt 215 lb (97.523 kg)  BMI 28.37 kg/m2  SpO2 97% No data found.  Physical Exam  Constitutional: He is oriented to person, place, and time. He appears well-developed and well-nourished. No distress.  Is a distinct smell of alcohol as I enter the room.  HENT:  Head: Normocephalic and atraumatic.  Eyes: Conjunctivae are normal. Pupils are equal, round, and reactive to light.  Neck: Normal range of motion. Neck supple.  Pulmonary/Chest: Effort normal and breath sounds normal. No respiratory distress. He has no wheezes. He has no rales.  Abdominal: Soft. Bowel sounds are normal. He exhibits no distension and no mass. There is no tenderness. There is no rebound and no guarding.  Musculoskeletal: He exhibits tenderness.  Admission of  the anterior ribs shows a large bruise/ecchymotic area over the lower ribs on the left extending over to the right side. There is tenderness over this area. There is no palpable skin spleen or liver. Is no tenderness to palpation of the spleen or liver negative McMurray sign is present. Abdomen is soft and nontender no guarding or rebound present.  Neurological: He is alert and oriented to person, place, and time.  Skin: Skin is warm and dry. He is not diaphoretic.  Psychiatric: He has a normal mood and affect. His behavior is normal. Judgment and thought content normal.  Nursing note and vitals reviewed.   ED Course  Procedures (including critical care time)  Labs Review Labs Reviewed - No data to display  Imaging Review Dg Ribs Bilateral W/chest  10/12/2015  CLINICAL DATA:  Rib pain after fall yesterday.  Initial encounter. EXAM: BILATERAL RIBS AND CHEST - 4+ VIEW COMPARISON:  08/05/2015 FINDINGS: No fracture or other bone lesions are seen involving the ribs. There is no evidence of pneumothorax or pleural effusion. Both lungs are clear. Heart size and mediastinal contours are within normal limits. IMPRESSION: Negative. Electronically Signed   By: Marnee SpringJonathon  Watts M.D.   On: 10/12/2015 12:13     Visual Acuity Review  Right Eye Distance:   Left Eye Distance:   Bilateral Distance:    Right Eye Near:   Left Eye Near:    Bilateral Near:         MDM   1. Rib contusion, left, initial encounter   2. Rib pain   3. Contusion of rib on left side, initial encounter    New Prescriptions   HYDROCODONE-ACETAMINOPHEN (NORCO/VICODIN) 5-325 MG TABLET    Take 1-2 tablets by mouth every 6 (six) hours as needed.  Plan: 1. Test/x-ray results and diagnosis reviewed with patient 2. rx as per orders; risks, benefits, potential side effects reviewed with patient 3. Recommend supportive treatment with Deep breathing and frequent coughing hourly while awake. I've asked him to use ibuprofen on a  regular basis and reserve the Vicodin for more severe pain or at nighttime so he can get more rest. From that sitting in a recliner may allow him to sleep more comfortably for the first few days. He may use ice on the area as necessary I usually 20 minutes out of every 2 hours. If he has a difficulty breathing or if he notices any abdominal tenderness or tightness or any changes he should be seen in the emergency department. He follow-up with his primary care physician in the next week or 2 . 4. F/u prn if symptoms worsen or don't improve      Lutricia FeilWilliam P Trevion Hoben, PA-C 10/12/15 1251

## 2015-10-12 NOTE — ED Notes (Signed)
Patient c/o bilateral rib pain after slipping on his wet deck yesterday.  Patient states that he hit the left side of his rib cage.  Patient states that the left side of his rib cage hurts worse than the right.  Patient denies any other pain or injuries.

## 2015-11-11 ENCOUNTER — Ambulatory Visit
Admission: EM | Admit: 2015-11-11 | Discharge: 2015-11-11 | Disposition: A | Discharge status: Home / Self Care | Payer: Medicaid Other | Attending: Emergency Medicine | Admitting: Emergency Medicine

## 2015-11-11 ENCOUNTER — Encounter: Payer: Self-pay | Admitting: Gynecology

## 2015-11-11 DIAGNOSIS — F1010 Alcohol abuse, uncomplicated: Secondary | ICD-10-CM | POA: Insufficient documentation

## 2015-11-11 DIAGNOSIS — A09 Infectious gastroenteritis and colitis, unspecified: Secondary | ICD-10-CM | POA: Diagnosis not present

## 2015-11-11 DIAGNOSIS — Z7982 Long term (current) use of aspirin: Secondary | ICD-10-CM | POA: Diagnosis not present

## 2015-11-11 DIAGNOSIS — Z79899 Other long term (current) drug therapy: Secondary | ICD-10-CM | POA: Diagnosis not present

## 2015-11-11 DIAGNOSIS — F17210 Nicotine dependence, cigarettes, uncomplicated: Secondary | ICD-10-CM | POA: Diagnosis not present

## 2015-11-11 DIAGNOSIS — K529 Noninfective gastroenteritis and colitis, unspecified: Secondary | ICD-10-CM | POA: Diagnosis not present

## 2015-11-11 DIAGNOSIS — I10 Essential (primary) hypertension: Secondary | ICD-10-CM | POA: Diagnosis not present

## 2015-11-11 DIAGNOSIS — R197 Diarrhea, unspecified: Secondary | ICD-10-CM | POA: Diagnosis present

## 2015-11-11 LAB — CBC WITH DIFFERENTIAL/PLATELET
Basophils Absolute: 0.1 10*3/uL (ref 0–0.1)
Basophils Relative: 1 %
Eosinophils Absolute: 0.2 10*3/uL (ref 0–0.7)
Eosinophils Relative: 1 %
HCT: 51.1 % (ref 40.0–52.0)
Hemoglobin: 17.2 g/dL (ref 13.0–18.0)
Lymphocytes Relative: 23 %
Lymphs Abs: 2.5 10*3/uL (ref 1.0–3.6)
MCH: 31.3 pg (ref 26.0–34.0)
MCHC: 33.6 g/dL (ref 32.0–36.0)
MCV: 93.1 fL (ref 80.0–100.0)
Monocytes Absolute: 0.7 10*3/uL (ref 0.2–1.0)
Monocytes Relative: 6 %
Neutro Abs: 7.6 10*3/uL — ABNORMAL HIGH (ref 1.4–6.5)
Neutrophils Relative %: 69 %
Platelets: 239 10*3/uL (ref 150–440)
RBC: 5.49 MIL/uL (ref 4.40–5.90)
RDW: 14 % (ref 11.5–14.5)
WBC: 11.1 10*3/uL — ABNORMAL HIGH (ref 3.8–10.6)

## 2015-11-11 LAB — COMPREHENSIVE METABOLIC PANEL
ALT: 33 U/L (ref 17–63)
AST: 31 U/L (ref 15–41)
Albumin: 3.9 g/dL (ref 3.5–5.0)
Alkaline Phosphatase: 56 U/L (ref 38–126)
Anion gap: 7 (ref 5–15)
BUN: 19 mg/dL (ref 6–20)
CO2: 24 mmol/L (ref 22–32)
Calcium: 8.5 mg/dL — ABNORMAL LOW (ref 8.9–10.3)
Chloride: 106 mmol/L (ref 101–111)
Creatinine, Ser: 1.02 mg/dL (ref 0.61–1.24)
GFR calc Af Amer: >60 mL/min (ref >60)
GFR calc non Af Amer: >60 mL/min (ref >60)
Glucose, Bld: 99 mg/dL (ref 65–99)
Potassium: 3.8 mmol/L (ref 3.5–5.1)
Sodium: 137 mmol/L (ref 135–145)
Total Bilirubin: 1.4 mg/dL — ABNORMAL HIGH (ref 0.3–1.2)
Total Protein: 6 g/dL — ABNORMAL LOW (ref 6.5–8.1)

## 2015-11-11 LAB — RAPID INFLUENZA A&B ANTIGENS: Influenza A (ARMC): NEGATIVE

## 2015-11-11 LAB — RAPID INFLUENZA A&B ANTIGENS (ARMC ONLY): INFLUENZA B (ARMC): NEGATIVE

## 2015-11-11 MED ORDER — ONDANSETRON 8 MG PO TBDP
8.0000 mg | ORAL_TABLET | Freq: Once | ORAL | Status: AC
  Administered 2015-11-11: 8 mg via ORAL

## 2015-11-11 MED ORDER — ONDANSETRON 8 MG PO TBDP: 8.0000 mg | ORAL_TABLET | Freq: Two times a day (BID) | ORAL | Status: DC

## 2015-11-11 NOTE — ED Provider Notes (Signed)
CSN: [id number]     Arrival date & time 11/11/15  1134 History   First MD Initiated Contact with Patient 11/11/15 1311     Chief Complaint  Patient presents with  . Generalized Body Aches  . Diarrhea   (Consider location/radiation/quality/duration/timing/severity/associated sxs/prior Treatment) HPI  35 year old male who presents with onset of diarrhea which is watery with no blood or mucus vomiting. He states that his had for 5 episodes of vomitus. He denies any abdominal pain. He had some [employer] yesterday as well as a chicken breast that he had at work. He works at [employer]. He states that he has felt clammy and has had achiness all over. He has had no fever or chills and is afebrile today at 98.1. Also is 91, respirations 18, blood pressure 141/89, 98%  S02 on room air.     Past Medical History  Diagnosis Date  . ETOH abuse   . Hypertension   . History of participation in smoking cessation counseling    Past Surgical History  Procedure Laterality Date  . Appendectomy    . Clavicale surgery Left    No family history on file. Social History  Substance Use Topics  . Smoking status: Current Every Day Smoker -- 0.50 packs/day    Types: Cigarettes  . Smokeless tobacco: None  . Alcohol Use: Yes    Review of Systems  Constitutional: Positive for activity change, appetite change and fatigue. Negative for fever and chills.  Gastrointestinal: Positive for nausea, vomiting and diarrhea. Negative for abdominal pain, constipation, blood in stool and abdominal distention.  All other systems reviewed and are negative.   Allergies  Review of patient's allergies indicates no known allergies.  Home Medications   Prior to Admission medications   Medication Sig Start Date End Date Taking? Authorizing Provider  aspirin EC 81 MG tablet Take 81 mg by mouth daily.   Yes Historical Provider, MD  escitalopram (LEXAPRO) 10 MG tablet Take 10 mg by mouth daily.   Yes Historical Provider,  MD  gabapentin (NEURONTIN) 600 MG tablet Take 600 mg by mouth 3 (three) times daily.   Yes Historical Provider, MD  lisinopril (PRINIVIL,ZESTRIL) 10 MG tablet Take 10 mg by mouth daily.   Yes Historical Provider, MD  ranitidine (ZANTAC) 300 MG tablet Take 300 mg by mouth 2 (two) times daily.   Yes Historical Provider, MD  amoxicillin (AMOXIL) 500 MG tablet Take 500 mg by mouth 2 (two) times daily.    Historical Provider, MD  HYDROcodone-acetaminophen (NORCO/VICODIN) 5-325 MG tablet Take 1-2 tablets by mouth every 6 (six) hours as needed. 10/12/15   Lutricia Feil, PA-C  meloxicam (MOBIC) 15 MG tablet Take 1 tablet (15 mg total) by mouth daily. 08/05/15   [name], MD  ondansetron (ZOFRAN ODT) 8 MG disintegrating tablet Take 1 tablet (8 mg total) by mouth 2 (two) times daily. 11/11/15   Lutricia Feil, PA-C  varenicline (CHANTIX PAK) 0.5 MG X 11 & 1 MG X 42 tablet Take by mouth 2 (two) times daily. Take one 0.5 mg tablet by mouth once daily for 3 days, then increase to one 0.5 mg tablet twice daily for 4 days, then increase to one 1 mg tablet twice daily.    Historical Provider, MD   Meds Ordered and Administered this Visit   Medications  ondansetron (ZOFRAN-ODT) disintegrating tablet 8 mg (8 mg Oral Given 11/11/15 1332)    BP 141/89 mmHg  Pulse 91  Temp(Src) 98.1 F (36.7  C) (Oral)  Resp 18  Ht 6\' 1"  (1.854 m)  Wt 215 lb (97.523 kg)  BMI 28.37 kg/m2  SpO2 98% No data found.   Physical Exam  Constitutional: He is oriented to person, place, and time. He appears well-developed and well-nourished. No distress.  HENT:  Head: Normocephalic and atraumatic.  Eyes: Conjunctivae are normal. Pupils are equal, round, and reactive to light.  Neck: Normal range of motion. Neck supple.  Pulmonary/Chest: Effort normal and breath sounds normal. No respiratory distress. He has no wheezes. He has no rales.  Abdominal: Soft. Bowel sounds are normal. He exhibits no distension. There is no tenderness.  There is no rebound and no guarding.  Musculoskeletal: Normal range of motion. He exhibits no edema or tenderness.  Lymphadenopathy:    He has no cervical adenopathy.  Neurological: He is alert and oriented to person, place, and time.  Skin: Skin is warm. He is not diaphoretic.  Patient is slightly clammy.  Psychiatric: He has a normal mood and affect. His behavior is normal. Judgment and thought content normal.  Nursing note and vitals reviewed.   ED Course  Procedures (including critical care time)  Labs Review Labs Reviewed  CBC WITH DIFFERENTIAL/PLATELET - Abnormal; Notable for the following:    WBC 11.1 (*)    Neutro Abs 7.6 (*)    All other components within normal limits  COMPREHENSIVE METABOLIC PANEL - Abnormal; Notable for the following:    Calcium 8.5 (*)    Total Protein 6.0 (*)    Total Bilirubin 1.4 (*)    All other components within normal limits  RAPID INFLUENZA A&B ANTIGENS ([facility] ONLY)    Imaging Review No results found.   Visual Acuity Review  Right Eye Distance:   Left Eye Distance:   Bilateral Distance:    Right Eye Near:   Left Eye Near:    Bilateral Near:     Medications  ondansetron (ZOFRAN-ODT) disintegrating tablet 8 mg (8 mg Oral Given 11/11/15 1332)      MDM   1. Gastroenteritis, infectious, presumed    Discharge Medication List as of 11/11/2015  2:10 PM    START taking these medications   Details  ondansetron (ZOFRAN ODT) 8 MG disintegrating tablet Take 1 tablet (8 mg total) by mouth 2 (two) times daily., Starting 11/11/2015, Until Discontinued, Normal      Plan: 1. Test/x-ray results and diagnosis reviewed with patient 2. rx as per orders; risks, benefits, potential side effects reviewed with patient 3. Recommend supportive treatment with Rest/ fluids. When he does resume eating he should start with a brat diet and progress as tolerated. He was given Zofran here which improved his nausea enabling him to tolerate fluids after fluid  challenge.  If he has any further problems he should return here if he is not improving in several days. 4. F/u prn if symptoms worsen or don't improve      Lutricia FeilWilliam P [place] Cookson, PA-C 11/11/15 1815  Lutricia FeilWilliam P [place] Tandy, PA-C 11/11/15 1815

## 2015-11-11 NOTE — Discharge Instructions (Signed)
Diarrhea Diarrhea is frequent loose and watery bowel movements. It can cause you to feel weak and dehydrated. Dehydration can cause you to become tired and thirsty, have a dry mouth, and have decreased urination that often is dark yellow. Diarrhea is a sign of another problem, most often an infection that will not last long. In most cases, diarrhea typically lasts 2-3 days. However, it can last longer if it is a sign of something more serious. It is important to treat your diarrhea as directed by your caregiver to lessen or prevent future episodes of diarrhea. CAUSES  Some common causes include:  Gastrointestinal infections caused by viruses, bacteria, or parasites.  Food poisoning or food allergies.  Certain medicines, such as antibiotics, chemotherapy, and laxatives.  Artificial sweeteners and fructose.  Digestive disorders. HOME CARE INSTRUCTIONS  Ensure adequate fluid intake (hydration): Have 1 cup (8 oz) of fluid for each diarrhea episode. Avoid fluids that contain simple sugars or sports drinks, fruit juices, whole milk products, and sodas. Your urine should be clear or pale yellow if you are drinking enough fluids. Hydrate with an oral rehydration solution that you can purchase at pharmacies, retail stores, and online. You can prepare an oral rehydration solution at home by mixing the following ingredients together:   - tsp table salt.   tsp baking soda.   tsp salt substitute containing potassium chloride.  1  tablespoons sugar.  1 L (34 oz) of water.  Certain foods and beverages may increase the speed at which food moves through the gastrointestinal (GI) tract. These foods and beverages should be avoided and include:  Caffeinated and alcoholic beverages.  High-fiber foods, such as raw fruits and vegetables, nuts, seeds, and whole grain breads and cereals.  Foods and beverages sweetened with sugar alcohols, such as xylitol, sorbitol, and mannitol.  Some foods may be well  tolerated and may help thicken stool including:  Starchy foods, such as rice, toast, pasta, low-sugar cereal, oatmeal, grits, baked potatoes, crackers, and bagels.  Bananas.  Applesauce.  Add probiotic-rich foods to help increase healthy bacteria in the GI tract, such as yogurt and fermented milk products.  Wash your hands well after each diarrhea episode.  Only take over-the-counter or prescription medicines as directed by your caregiver.  Take a warm bath to relieve any burning or pain from frequent diarrhea episodes. SEEK IMMEDIATE MEDICAL CARE IF:   You are unable to keep fluids down.  You have persistent vomiting.  You have blood in your stool, or your stools are black and tarry.  You do not urinate in 6-8 hours, or there is only a small amount of very dark urine.  You have abdominal pain that increases or localizes.  You have weakness, dizziness, confusion, or light-headedness.  You have a severe headache.  Your diarrhea gets worse or does not get better.  You have a fever or persistent symptoms for more than 2-3 days.  You have a fever and your symptoms suddenly get worse. MAKE SURE YOU:   Understand these instructions.  Will watch your condition.  Will get help right away if you are not doing well or get worse.   This information is not intended to replace advice given to you by your health care provider. Make sure you discuss any questions you have with your health care provider.   Document Released: 06/14/2002 Document Revised: 07/15/2014 Document Reviewed: 03/01/2012 Elsevier Interactive Patient Education 2016 Elsevier Inc.   Food Choices to Help Relieve Diarrhea, Adult When you have   diarrhea, the foods you eat and your eating habits are very important. Choosing the right foods and drinks can help relieve diarrhea. Also, because diarrhea can last up to 7 days, you need to replace lost fluids and electrolytes (such as sodium, potassium, and chloride) in  order to help prevent dehydration.  WHAT GENERAL GUIDELINES DO I NEED TO FOLLOW?  Slowly drink 1 cup (8 oz) of fluid for each episode of diarrhea. If you are getting enough fluid, your urine will be clear or pale yellow.  Eat starchy foods. Some good choices include white rice, white toast, pasta, low-fiber cereal, baked potatoes (without the skin), saltine crackers, and bagels.  Avoid large servings of any cooked vegetables.  Limit fruit to two servings per day. A serving is  cup or 1 small piece.  Choose foods with less than 2 g of fiber per serving.  Limit fats to less than 8 tsp (38 g) per day.  Avoid fried foods.  Eat foods that have probiotics in them. Probiotics can be found in certain dairy products.  Avoid foods and beverages that may increase the speed at which food moves through the stomach and intestines (gastrointestinal tract). Things to avoid include:  High-fiber foods, such as dried fruit, raw fruits and vegetables, nuts, seeds, and whole grain foods.  Spicy foods and high-fat foods.  Foods and beverages sweetened with high-fructose corn syrup, honey, or sugar alcohols such as xylitol, sorbitol, and mannitol. WHAT FOODS ARE RECOMMENDED? Grains White rice. White, French, or pita breads (fresh or toasted), including plain rolls, buns, or bagels. White pasta. Saltine, soda, or graham crackers. Pretzels. Low-fiber cereal. Cooked cereals made with water (such as cornmeal, farina, or cream cereals). Plain muffins. Matzo. Melba toast. Zwieback.  Vegetables Potatoes (without the skin). Strained tomato and vegetable juices. Most well-cooked and canned vegetables without seeds. Tender lettuce. Fruits Cooked or canned applesauce, apricots, cherries, fruit cocktail, grapefruit, peaches, pears, or plums. Fresh bananas, apples without skin, cherries, grapes, cantaloupe, grapefruit, peaches, oranges, or plums.  Meat and Other Protein Products Baked or boiled chicken. Eggs. Tofu.  Fish. Seafood. Smooth peanut butter. Ground or well-cooked tender beef, ham, veal, lamb, pork, or poultry.  Dairy Plain yogurt, kefir, and unsweetened liquid yogurt. Lactose-free milk, buttermilk, or soy milk. Plain hard cheese. Beverages Sport drinks. Clear broths. Diluted fruit juices (except prune). Regular, caffeine-free sodas such as ginger ale. Water. Decaffeinated teas. Oral rehydration solutions. Sugar-free beverages not sweetened with sugar alcohols. Other Bouillon, broth, or soups made from recommended foods.  The items listed above may not be a complete list of recommended foods or beverages. Contact your dietitian for more options. WHAT FOODS ARE NOT RECOMMENDED? Grains Whole grain, whole wheat, bran, or rye breads, rolls, pastas, crackers, and cereals. Wild or brown rice. Cereals that contain more than 2 g of fiber per serving. Corn tortillas or taco shells. Cooked or dry oatmeal. Granola. Popcorn. Vegetables Raw vegetables. Cabbage, broccoli, Brussels sprouts, artichokes, baked beans, beet greens, corn, kale, legumes, peas, sweet potatoes, and yams. Potato skins. Cooked spinach and cabbage. Fruits Dried fruit, including raisins and dates. Raw fruits. Stewed or dried prunes. Fresh apples with skin, apricots, mangoes, pears, raspberries, and strawberries.  Meat and Other Protein Products Chunky peanut butter. Nuts and seeds. Beans and lentils. Bacon.  Dairy High-fat cheeses. Milk, chocolate milk, and beverages made with milk, such as milk shakes. Cream. Ice cream. Sweets and Desserts Sweet rolls, doughnuts, and sweet breads. Pancakes and waffles. Fats and Oils Butter. Cream sauces.   Margarine. Salad oils. Plain salad dressings. Olives. Avocados.  Beverages Caffeinated beverages (such as coffee, tea, soda, or energy drinks). Alcoholic beverages. Fruit juices with pulp. Prune juice. Soft drinks sweetened with high-fructose corn syrup or sugar alcohols. Other Coconut. Hot sauce.  Chili powder. Mayonnaise. Gravy. Cream-based or milk-based soups.  The items listed above may not be a complete list of foods and beverages to avoid. Contact your dietitian for more information. WHAT SHOULD I DO IF I BECOME DEHYDRATED? Diarrhea can sometimes lead to dehydration. Signs of dehydration include dark urine and dry mouth and skin. If you think you are dehydrated, you should rehydrate with an oral rehydration solution. These solutions can be purchased at pharmacies, retail stores, or online.  Drink -1 cup (120-240 mL) of oral rehydration solution each time you have an episode of diarrhea. If drinking this amount makes your diarrhea worse, try drinking smaller amounts more often. For example, drink 1-3 tsp (5-15 mL) every 5-10 minutes.  A general rule for staying hydrated is to drink 1-2 L of fluid per day. Talk to your health care provider about the specific amount you should be drinking each day. Drink enough fluids to keep your urine clear or pale yellow.   This information is not intended to replace advice given to you by your health care provider. Make sure you discuss any questions you have with your health care provider.   Document Released: 09/14/2003 Document Revised: 07/15/2014 Document Reviewed: 05/17/2013 Elsevier Interactive Patient Education 2016 Elsevier Inc.   

## 2015-11-11 NOTE — ED Notes (Signed)
Per patient body ache / diarrhea x last pm.

## 2016-01-22 ENCOUNTER — Emergency Department
Admission: EM | Admit: 2016-01-22 | Discharge: 2016-01-22 | Disposition: A | Discharge status: Home / Self Care | Payer: Medicaid Other | Attending: Emergency Medicine | Admitting: Emergency Medicine

## 2016-01-22 ENCOUNTER — Emergency Department: Payer: Medicaid Other

## 2016-01-22 ENCOUNTER — Encounter: Payer: Self-pay | Admitting: Emergency Medicine

## 2016-01-22 DIAGNOSIS — F17210 Nicotine dependence, cigarettes, uncomplicated: Secondary | ICD-10-CM | POA: Insufficient documentation

## 2016-01-22 DIAGNOSIS — S39012A Strain of muscle, fascia and tendon of lower back, initial encounter: Secondary | ICD-10-CM | POA: Diagnosis not present

## 2016-01-22 DIAGNOSIS — Z792 Long term (current) use of antibiotics: Secondary | ICD-10-CM | POA: Insufficient documentation

## 2016-01-22 DIAGNOSIS — M545 Low back pain: Secondary | ICD-10-CM | POA: Diagnosis present

## 2016-01-22 DIAGNOSIS — Y929 Unspecified place or not applicable: Secondary | ICD-10-CM | POA: Diagnosis not present

## 2016-01-22 DIAGNOSIS — I10 Essential (primary) hypertension: Secondary | ICD-10-CM | POA: Diagnosis not present

## 2016-01-22 DIAGNOSIS — Z79899 Other long term (current) drug therapy: Secondary | ICD-10-CM | POA: Insufficient documentation

## 2016-01-22 DIAGNOSIS — X500XXA Overexertion from strenuous movement or load, initial encounter: Secondary | ICD-10-CM | POA: Diagnosis not present

## 2016-01-22 DIAGNOSIS — Y9389 Activity, other specified: Secondary | ICD-10-CM | POA: Diagnosis not present

## 2016-01-22 DIAGNOSIS — Z7982 Long term (current) use of aspirin: Secondary | ICD-10-CM | POA: Diagnosis not present

## 2016-01-22 DIAGNOSIS — Y990 Civilian activity done for income or pay: Secondary | ICD-10-CM | POA: Insufficient documentation

## 2016-01-22 MED ORDER — METHOCARBAMOL 500 MG PO TABS: 500.0000 mg | ORAL_TABLET | Freq: Four times a day (QID) | ORAL | Status: DC | PRN

## 2016-01-22 MED ORDER — MELOXICAM 15 MG PO TABS: 15.0000 mg | ORAL_TABLET | Freq: Every day | ORAL | Status: DC

## 2016-01-22 MED ORDER — KETOROLAC TROMETHAMINE 60 MG/2ML IM SOLN
60.0000 mg | Freq: Once | INTRAMUSCULAR | Status: AC
  Administered 2016-01-22: 60 mg via INTRAMUSCULAR
  Filled 2016-01-22: qty 2

## 2016-01-22 NOTE — Discharge Instructions (Signed)
Back Injury Prevention Back injuries can be very painful. They can also be difficult to heal. After having one back injury, you are more likely to injure your back again. It is important to learn how to avoid injuring or re-injuring your back. The following tips can help you to prevent a back injury. WHAT SHOULD I KNOW ABOUT PHYSICAL FITNESS?  Exercise for 30 minutes per day on most days of the week or as directed by your health care provider. Make sure to:  Do aerobic exercises, such as walking, jogging, biking, or swimming.  Do exercises that increase balance and strength, such as tai chi and yoga. These can decrease your risk of falling and injuring your back.  Do stretching exercises to help with flexibility.  Try to develop strong abdominal muscles. Your abdominal muscles provide a lot of the support that is needed by your back.  Maintain a healthy weight. This helps to decrease your risk of a back injury. WHAT SHOULD I KNOW ABOUT MY DIET?  Talk with your health care provider about your overall diet. Take supplements and vitamins only as directed by your health care provider.  Talk with your health care provider about how much calcium and vitamin D you need each day. These nutrients help to prevent weakening of the bones (osteoporosis). Osteoporosis can cause broken (fractured) bones, which lead to back pain.  Include good sources of calcium in your diet, such as dairy products, green leafy vegetables, and products that have had calcium added to them (fortified).  Include good sources of vitamin D in your diet, such as milk and foods that are fortified with vitamin D. WHAT SHOULD I KNOW ABOUT MY POSTURE?  Sit up straight and stand up straight. Avoid leaning forward when you sit or hunching over when you stand.  Choose chairs that have good low-back (lumbar) support.  If you work at a desk, sit close to it so you do not need to lean over. Keep your chin tucked in. Keep your neck  drawn back, and keep your elbows bent at a right angle. Your arms should look like the letter "L."  Sit high and close to the steering wheel when you drive. Add a lumbar support to your car seat, if needed.  Avoid sitting or standing in one position for very long. Take breaks to get up, stretch, and walk around at least one time every hour. Take breaks every hour if you are driving for long periods of time.  Sleep on your side with your knees slightly bent, or sleep on your back with a pillow under your knees. Do not lie on the front of your body to sleep. WHAT SHOULD I KNOW ABOUT LIFTING, TWISTING, AND REACHING? Lifting and Heavy Lifting  Avoid heavy lifting, especially repetitive heavy lifting. If you must do heavy lifting:  Stretch before lifting.  Work slowly.  Rest between lifts.  Use a tool such as a cart or a dolly to move objects if one is available.  Make several small trips instead of carrying one heavy load.  Ask for help when you need it, especially when moving big objects.  Follow these steps when lifting:  Stand with your feet shoulder-width apart.  Get as close to the object as you can. Do not try to pick up a heavy object that is far from your body.  Use handles or lifting straps if they are available.  Bend at your knees. Squat down, but keep your heels off the floor.  Keep your shoulders pulled back, your chin tucked in, and your back straight.  Lift the object slowly while you tighten the muscles in your legs, abdomen, and buttocks. Keep the object as close to the center of your body as possible.  Follow these steps when putting down a heavy load:  Stand with your feet shoulder-width apart.  Lower the object slowly while you tighten the muscles in your legs, abdomen, and buttocks. Keep the object as close to the center of your body as possible.  Keep your shoulders pulled back, your chin tucked in, and your back straight.  Bend at your knees. Squat  down, but keep your heels off the floor.  Use handles or lifting straps if they are available. Twisting and Reaching  Avoid lifting heavy objects above your waist.  Do not twist at your waist while you are lifting or carrying a load. If you need to turn, move your feet.  Do not bend over without bending at your knees.  Avoid reaching over your head, across a table, or for an object on a high surface. WHAT ARE SOME OTHER TIPS?  Avoid wet floors and icy ground. Keep sidewalks clear of ice to prevent falls.  Do not sleep on a mattress that is too soft or too hard.  Keep items that are used frequently within easy reach.  Put heavier objects on shelves at waist level, and put lighter objects on lower or higher shelves.  Find ways to decrease your stress, such as exercise, massage, or relaxation techniques. Stress can build up in your muscles. Tense muscles are more vulnerable to injury.  Talk with your health care provider if you feel anxious or depressed. These conditions can make back pain worse.  Wear flat heel shoes with cushioned soles.  Avoid sudden movements.  Use both shoulder straps when carrying a backpack.  Do not use any tobacco products, including cigarettes, chewing tobacco, or electronic cigarettes. If you need help quitting, ask your health care provider.   This information is not intended to replace advice given to you by your health care provider. Make sure you discuss any questions you have with your health care provider.   Document Released: 08/01/2004 Document Revised: 11/08/2014 Document Reviewed: 06/28/2014 Elsevier Interactive Patient Education [employer].

## 2016-01-22 NOTE — ED Notes (Signed)
See triage note   States he developed mid to lower back pain about 2 weeks ago. States pain is radiating into right leg  Ambulates well to treatment room

## 2016-01-22 NOTE — ED Notes (Signed)
Pt presents with low back pain for two weeks. States he lifts heavy things at work.

## 2016-01-22 NOTE — ED Provider Notes (Signed)
[facility] Emergency Department Provider Note   ____________________________________________  Time seen: Approximately 2:24 PM  I have reviewed the triage vital signs and the nursing notes.   HISTORY  Chief Complaint Back Pain    HPI Guy Rodriguez is a 35 y.o. male who presents to the ED for evaluation of back pain x2 weeks. Patient states that he originally hurt his back approx. Two weeks ago. 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 Patient states that the pain is constant and does not get better or worse with standing, sitting, or lying down. Pt states that his gait at time seems unsteady because of the amount of pain he is in while walking. He is having moderate numbness/tingling into his right lower extremity. He denies saddle anesthesia. He has tried anit-inflammatory medications and a slew of over the counter remedies without relief. Pain is currently 9/10.   Past Medical History  Diagnosis Date  . ETOH abuse   . Hypertension   . History of participation in smoking cessation counseling     There are no active problems to display for this patient.   Past Surgical History  Procedure Laterality Date  . Appendectomy    . Clavicale surgery Left     Current Outpatient Rx  Name  Route  Sig  Dispense  Refill  . amoxicillin (AMOXIL) 500 MG tablet   Oral   Take 500 mg by mouth 2 (two) times daily.         Marland Kitchen aspirin EC 81 MG tablet   Oral   Take 81 mg by mouth daily.         Marland Kitchen escitalopram (LEXAPRO) 10 MG tablet   Oral   Take 10 mg by mouth daily.         Marland Kitchen gabapentin (NEURONTIN) 600 MG tablet   Oral   Take 600 mg by mouth 3 (three) times daily.         Marland Kitchen HYDROcodone-acetaminophen (NORCO/VICODIN) 5-325 MG tablet   Oral   Take 1-2 tablets by mouth every 6  (six) hours as needed.   15 tablet   0   . lisinopril (PRINIVIL,ZESTRIL) 10 MG tablet   Oral   Take 10 mg by mouth daily.         . meloxicam (MOBIC) 15 MG tablet   Oral   Take 1 tablet (15 mg total) by mouth daily.   30 tablet   0   . methocarbamol (ROBAXIN) 500 MG tablet   Oral   Take 1 tablet (500 mg total) by mouth every 6 (six) hours as needed for muscle spasms.   30 tablet   0   . ondansetron (ZOFRAN ODT) 8 MG disintegrating tablet   Oral   Take 1 tablet (8 mg total) by mouth 2 (two) times daily.   6 tablet   0   . ranitidine (ZANTAC) 300 MG tablet   Oral   Take 300 mg by mouth 2 (two) times daily.         . varenicline (CHANTIX PAK) 0.5 MG X 11 & 1 MG X 42 tablet   Oral   Take by mouth 2 (two) times daily. Take one 0.5 mg tablet by mouth once daily for 3 days, then increase to one 0.5 mg tablet  twice daily for 4 days, then increase to one 1 mg tablet twice daily.           Allergies Review of patient's allergies indicates no known allergies.  No family history on file.  Social History Social History  Substance Use Topics  . Smoking status: Current Every Day Smoker -- 0.50 packs/day    Types: Cigarettes  . Smokeless tobacco: None  . Alcohol Use: Yes    Review of Systems Constitutional: No fever/chills  Cardiovascular: Denies chest pain. Respiratory: Denies shortness of breath. Gastrointestinal: No abdominal pain.  No nausea, no vomiting.  No diarrhea.  No constipation. Genitourinary: Negative for dysuria. Musculoskeletal: Back pain throughout the lumbar and thoracic areas. He notes numbness to right leg. No bruising or redness Skin: Negative for rash. Neurological: Negative for headaches, focal weakness or numbness.  10-point ROS otherwise negative.  ____________________________________________   PHYSICAL EXAM:  VITAL SIGNS: ED Triage Vitals  Enc Vitals Group     BP 01/22/16 1254 142/71 mmHg     Pulse Rate 01/22/16 1254 79     Resp  --      Temp 01/22/16 1254 98.3 F (36.8 C)     Temp Source 01/22/16 1254 Oral     SpO2 01/22/16 1254 95 %     Weight 01/22/16 1254 207 lb (93.895 kg)     Height 01/22/16 1254 6\' 1"  (1.854 m)     Head Cir --      Peak Flow --      Pain Score 01/22/16 1259 9     Pain Loc --      Pain Edu? --      Excl. in GC? --     Constitutional: Alert and oriented. Well appearing and in no acute distress. Head: Atraumatic. Nose: No congestion/rhinnorhea. Mouth/Throat: Mucous membranes are moist.  Oropharynx non-erythematous. Neck: No stridor.   Cardiovascular: Normal rate, regular rhythm. Grossly normal heart sounds.  Good peripheral circulation. Respiratory: Normal respiratory effort.  No retractions. Lungs CTAB. Gastrointestinal: Soft and nontender. No distention. No abdominal bruits. No CVA tenderness. Musculoskeletal: No lower extremity tenderness nor edema.  No joint effusions. Patient has moderate pain upon palpation to lumbar and thoracic vertebrae. Ambulation triggered significant pain Neurologic:  Normal speech and language. No gross focal neurologic deficits are appreciated. No gait instability. Skin:  Skin is warm, dry and intact. No rash noted. Psychiatric: Mood and affect are normal. Speech and behavior are normal.  ____________________________________________   LABS (all labs ordered are listed, but only abnormal results are displayed)  Labs Reviewed - No data to display ____________________________________________  EKG   ____________________________________________  RADIOLOGY  No fracture seen. Mild degenerative changes at L5-S1. ____________________________________________   PROCEDURES  Procedure(s) performed: None  Procedures  Critical Care performed: No  ____________________________________________   INITIAL IMPRESSION / ASSESSMENT AND PLAN / ED COURSE  Pertinent labs & imaging results that were available during my care of the patient were reviewed by me  and considered in my medical decision making (see chart for details).  Patient does not have any osseous changes seen on lumbar and thoracic vertebrae x-ray. Pt diagnosed with strain to lumbar Paraspinous muscles. He is prescribed Meloxicam and Methocarbamol to help with pain and muscle spasms. Patient has been instructed to follow up with orthopedics if symptoms worsen.  ____________________________________________   FINAL CLINICAL IMPRESSION(S) / ED DIAGNOSES  Final diagnoses:  Strain of lumbar paraspinal muscle, initial encounter      NEW MEDICATIONS STARTED DURING THIS  VISIT:  Discharge Medication List as of 01/22/2016  2:37 PM    START taking these medications   Details  methocarbamol (ROBAXIN) 500 MG tablet Take 1 tablet (500 mg total) by mouth every 6 (six) hours as needed for muscle spasms., Starting 01/22/2016, Until Discontinued, Print         Note:  This document was prepared using Dragon voice recognition software and may include unintentional dictation errors.   Evangeline DakinCharles M Rabecka Brendel, PA-C 01/22/16 1605  Sharman CheekPhillip Stafford, MD 01/23/16 2010

## 2016-02-13 ENCOUNTER — Emergency Department
Admission: EM | Admit: 2016-02-13 | Discharge: 2016-02-14 | Disposition: A | Discharge status: Home / Self Care | Payer: Medicaid Other | Source: Home / Self Care | Attending: Emergency Medicine | Admitting: Emergency Medicine

## 2016-02-13 ENCOUNTER — Encounter: Payer: Self-pay | Admitting: Emergency Medicine

## 2016-02-13 DIAGNOSIS — F329 Major depressive disorder, single episode, unspecified: Secondary | ICD-10-CM | POA: Diagnosis not present

## 2016-02-13 DIAGNOSIS — R45851 Suicidal ideations: Secondary | ICD-10-CM | POA: Diagnosis present

## 2016-02-13 DIAGNOSIS — Z7982 Long term (current) use of aspirin: Secondary | ICD-10-CM

## 2016-02-13 DIAGNOSIS — Z79899 Other long term (current) drug therapy: Secondary | ICD-10-CM | POA: Diagnosis not present

## 2016-02-13 DIAGNOSIS — F1410 Cocaine abuse, uncomplicated: Secondary | ICD-10-CM | POA: Insufficient documentation

## 2016-02-13 DIAGNOSIS — F1910 Other psychoactive substance abuse, uncomplicated: Secondary | ICD-10-CM

## 2016-02-13 DIAGNOSIS — I10 Essential (primary) hypertension: Secondary | ICD-10-CM | POA: Insufficient documentation

## 2016-02-13 DIAGNOSIS — F1010 Alcohol abuse, uncomplicated: Secondary | ICD-10-CM | POA: Insufficient documentation

## 2016-02-13 DIAGNOSIS — F1994 Other psychoactive substance use, unspecified with psychoactive substance-induced mood disorder: Secondary | ICD-10-CM | POA: Diagnosis not present

## 2016-02-13 DIAGNOSIS — F17210 Nicotine dependence, cigarettes, uncomplicated: Secondary | ICD-10-CM | POA: Diagnosis not present

## 2016-02-13 MED ORDER — LORAZEPAM 1 MG PO TABS
1.0000 mg | ORAL_TABLET | Freq: Once | ORAL | Status: AC
  Administered 2016-02-13: 1 mg via ORAL
  Filled 2016-02-13: qty 1

## 2016-02-13 MED ORDER — CHLORDIAZEPOXIDE HCL 10 MG PO CAPS: 10.0000 mg | ORAL_CAPSULE | Freq: Three times a day (TID) | ORAL | 0 refills | Status: DC | PRN

## 2016-02-13 NOTE — ED Notes (Signed)
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

## 2016-02-13 NOTE — ED Triage Notes (Signed)
Pt presents to ED for medical clearance for a rehab center in [place].

## 2016-02-13 NOTE — BH Assessment (Signed)
Per request of the ER MD (Dr. Scotty CourtStafford), writer talked with the patient about his options for detox and SA Treatment. Patient states he was previously at [facility] and was discharged from their services within two days due to having his cell phone with him. He further states, he was using alcohol and cocaine and wanted to stop. Last time of use was today (02/13/2016) at 5:00pm. Writer explained to him he will need to follow up with [facility] and he Hydrographic surveyor(writer) was unable to speak for them about their admissions, due to being another agency outside of the [facility]. Patient voiced he cannot return to his current living situation due to the drug use in the home.   Patient denies SI/HI and AV/H. This was asked by the writer three times prior to telling him, he would have to coordinate with [facility] himself for treatment.   Writer provided the patient with contact information for [facility].  Writer updated ER MD (Dr. Scotty CourtStafford)

## 2016-02-13 NOTE — ED Provider Notes (Signed)
[facility] Emergency Department Provider Note  ____________________________________________  Time seen: Approximately 9:40 PM  I have reviewed the triage vital signs and the nursing notes.   HISTORY  Chief Complaint Medical Clearance    HPI Guy SauerMatthew Rodriguez is a 35 y.o. male who complains of anxiousness and requests help with detox from cocaine and alcohol. He reports that he's been smoking cocaine daily for many months. Also drinks every day. He was recently at [facility] for a detox and rehabilitation program within the last week, but he states probably 2 days and then went home to his transitional housing where he began using drugs again. Last cocaine was 5 PM today. Last alcohol was 6 PM today. He drinks about 3 40s of beer daily.Denies any chest pain shortness of breath or headache whatsoever.     Past Medical History:  Diagnosis Date  . ETOH abuse   . History of participation in smoking cessation counseling   . Hypertension      There are no active problems to display for this patient.    Past Surgical History:  Procedure Laterality Date  . APPENDECTOMY    . clavicale surgery Left      Prior to Admission medications   Medication Sig Start Date End Date Taking? Authorizing Provider  amoxicillin (AMOXIL) 500 MG tablet Take 500 mg by mouth 2 (two) times daily.    Historical Provider, MD  aspirin EC 81 MG tablet Take 81 mg by mouth daily.    Historical Provider, MD  chlordiazePOXIDE (LIBRIUM) 10 MG capsule Take 1 capsule (10 mg total) by mouth 3 (three) times daily as needed for anxiety. 02/13/16   Sharman CheekPhillip Osualdo Hansell, MD  escitalopram (LEXAPRO) 10 MG tablet Take 10 mg by mouth daily.    Historical Provider, MD  gabapentin (NEURONTIN) 600 MG tablet Take 600 mg by mouth 3 (three) times daily.    Historical Provider, MD  HYDROcodone-acetaminophen (NORCO/VICODIN) 5-325 MG tablet Take 1-2 tablets by mouth every 6 (six) hours as needed. 10/12/15   Lutricia FeilWilliam P  Roemer, PA-C  lisinopril (PRINIVIL,ZESTRIL) 10 MG tablet Take 10 mg by mouth daily.    Historical Provider, MD  meloxicam (MOBIC) 15 MG tablet Take 1 tablet (15 mg total) by mouth daily. 01/22/16   Charmayne Sheerharles M Beers, PA-C  methocarbamol (ROBAXIN) 500 MG tablet Take 1 tablet (500 mg total) by mouth every 6 (six) hours as needed for muscle spasms. 01/22/16   Charmayne Sheerharles M Beers, PA-C  ondansetron (ZOFRAN ODT) 8 MG disintegrating tablet Take 1 tablet (8 mg total) by mouth 2 (two) times daily. 11/11/15   Lutricia FeilWilliam P Roemer, PA-C  ranitidine (ZANTAC) 300 MG tablet Take 300 mg by mouth 2 (two) times daily.    Historical Provider, MD  varenicline (CHANTIX PAK) 0.5 MG X 11 & 1 MG X 42 tablet Take by mouth 2 (two) times daily. Take one 0.5 mg tablet by mouth once daily for 3 days, then increase to one 0.5 mg tablet twice daily for 4 days, then increase to one 1 mg tablet twice daily.    Historical Provider, MD     Allergies Review of patient's allergies indicates no known allergies.   History reviewed. No pertinent family history.  Social History Social History  Substance Use Topics  . Smoking status: Current Every Day Smoker    Packs/day: 0.50    Types: Cigarettes  . Smokeless tobacco: Not on file  . Alcohol use Yes    Review of Systems  Constitutional:  No fever or chills.  ENT:   No sore throat. No rhinorrhea. Cardiovascular:   No chest pain. Respiratory:   No dyspnea or cough. Gastrointestinal:   Negative for abdominal pain, vomiting and diarrhea.  Musculoskeletal:   Negative for focal pain or swelling Neurological:   Negative for headaches 10-point ROS otherwise negative.  ____________________________________________   PHYSICAL EXAM:  VITAL SIGNS: ED Triage Vitals  Enc Vitals Group     BP 02/13/16 1828 (!) 156/105     Pulse Rate 02/13/16 1828 99     Resp 02/13/16 1828 18     Temp 02/13/16 1828 98.3 F (36.8 C)     Temp Source 02/13/16 1828 Oral     SpO2 02/13/16 1828 99 %      Weight 02/13/16 1828 204 lb (92.5 kg)     Height 02/13/16 1828 6\' 1"  (1.854 m)     Head Circumference --      Peak Flow --      Pain Score 02/13/16 1829 0     Pain Loc --      Pain Edu? --      Excl. in GC? --     Vital signs reviewed, nursing assessments reviewed.   Constitutional:   Alert and oriented. Well appearing and in no distress.Somewhat anxious appearing.  Eyes:   No scleral icterus. No conjunctival pallor. PERRL. EOMI.  No nystagmus. ENT   Head:   Normocephalic and atraumatic.   Nose:   No congestion/rhinnorhea. No septal hematoma   Mouth/Throat:   MMM, no pharyngeal erythema. No peritonsillar mass.    Neck:   No stridor. No SubQ emphysema. No meningismus. Hematological/Lymphatic/Immunilogical:   No cervical lymphadenopathy. Cardiovascular:   RRR. Symmetric bilateral radial and DP pulses.  No murmurs.  Respiratory:   Normal respiratory effort without tachypnea nor retractions. Breath sounds are clear and equal bilaterally. No wheezes/rales/rhonchi. Gastrointestinal:   Soft and nontender. Non distended. There is no CVA tenderness.  No rebound, rigidity, or guarding. Genitourinary:   deferred Musculoskeletal:   Nontender with normal range of motion in all extremities. No joint effusions.  No lower extremity tenderness.  No edema. Neurologic:   Normal speech and language.  CN 2-10 normal. Motor grossly intact. No gross focal neurologic deficits are appreciated.  Skin:    Skin is warm, dry and intact. No rash noted.  No petechiae, purpura, or bullae.  ____________________________________________    LABS (pertinent positives/negatives) (all labs ordered are listed, but only abnormal results are displayed) Labs Reviewed - No data to display ____________________________________________   EKG    ____________________________________________     RADIOLOGY    ____________________________________________   PROCEDURES Procedures  ____________________________________________   INITIAL IMPRESSION / ASSESSMENT AND PLAN / ED COURSE  Pertinent labs & imaging results that were available during my care of the patient were reviewed by me and considered in my medical decision making (see chart for details).  Patient presents requesting detox for cocaine and alcohol use. No acute symptoms other than some anxiousness. He was given oral Ativan. No SI HI or hallucinations. Calm and cooperative. Thought, intact reason and insight. Not a danger to himself or others. Medically stable. No further workup is needed. I'll prescribe him a short course of oral Librium to manage withdrawal symptoms, and he was given outpatient resources for [facility] follow-up. On discussion with [facility], it is apparent that the patient was discharged from [facility] because he was not following the rules there. We are unfortunately unable to resolve his  noncompliance issues for him.     Clinical Course   ____________________________________________   FINAL CLINICAL IMPRESSION(S) / ED DIAGNOSES  Final diagnoses:  Polysubstance abuse  Cocaine abuse  Alcohol abuse       Portions of this note were generated with dragon dictation software. Dictation errors may occur despite best attempts at proofreading.    Sharman Cheek, MD 02/13/16 2252

## 2016-02-13 NOTE — Discharge Instructions (Signed)
You were prescribed a medication that is potentially sedating. Do not drink alcohol, drive or participate in any other potentially dangerous activities while taking this medication as it may make you sleepy. Do not take this medication with any other sedating medications, either prescription or over-the-counter.

## 2016-02-14 ENCOUNTER — Emergency Department
Admission: EM | Admit: 2016-02-14 | Discharge: 2016-02-14 | Disposition: A | Discharge status: Home / Self Care | Payer: Medicaid Other | Attending: Student in an Organized Health Care Education/Training Program | Admitting: Student in an Organized Health Care Education/Training Program

## 2016-02-14 ENCOUNTER — Encounter: Payer: Self-pay | Admitting: *Deleted

## 2016-02-14 DIAGNOSIS — F17210 Nicotine dependence, cigarettes, uncomplicated: Secondary | ICD-10-CM | POA: Insufficient documentation

## 2016-02-14 DIAGNOSIS — Z79899 Other long term (current) drug therapy: Secondary | ICD-10-CM | POA: Insufficient documentation

## 2016-02-14 DIAGNOSIS — F321 Major depressive disorder, single episode, moderate: Secondary | ICD-10-CM

## 2016-02-14 DIAGNOSIS — F1994 Other psychoactive substance use, unspecified with psychoactive substance-induced mood disorder: Secondary | ICD-10-CM | POA: Diagnosis not present

## 2016-02-14 DIAGNOSIS — Z7982 Long term (current) use of aspirin: Secondary | ICD-10-CM | POA: Insufficient documentation

## 2016-02-14 DIAGNOSIS — I10 Essential (primary) hypertension: Secondary | ICD-10-CM | POA: Insufficient documentation

## 2016-02-14 DIAGNOSIS — F329 Major depressive disorder, single episode, unspecified: Secondary | ICD-10-CM | POA: Insufficient documentation

## 2016-02-14 DIAGNOSIS — F32A Depression, unspecified: Secondary | ICD-10-CM

## 2016-02-14 DIAGNOSIS — F1410 Cocaine abuse, uncomplicated: Secondary | ICD-10-CM

## 2016-02-14 DIAGNOSIS — F1010 Alcohol abuse, uncomplicated: Secondary | ICD-10-CM

## 2016-02-14 LAB — ACETAMINOPHEN LEVEL

## 2016-02-14 LAB — URINE DRUG SCREEN, QUALITATIVE (ARMC ONLY)
AMPHETAMINES, UR SCREEN: NOT DETECTED
BENZODIAZEPINE, UR SCRN: NOT DETECTED
Barbiturates, Ur Screen: NOT DETECTED
Cannabinoid 50 Ng, Ur ~~LOC~~: NOT DETECTED
Cocaine Metabolite,Ur ~~LOC~~: POSITIVE — AB
MDMA (Ecstasy)Ur Screen: NOT DETECTED
METHADONE SCREEN, URINE: NOT DETECTED
Opiate, Ur Screen: NOT DETECTED
Phencyclidine (PCP) Ur S: NOT DETECTED
Tricyclic, Ur Screen: NOT DETECTED

## 2016-02-14 LAB — CBC
HCT: 47.9 % (ref 40.0–52.0)
Hemoglobin: 16.5 g/dL (ref 13.0–18.0)
MCH: 32.1 pg (ref 26.0–34.0)
MCHC: 34.5 g/dL (ref 32.0–36.0)
MCV: 93 fL (ref 80.0–100.0)
PLATELETS: 212 10*3/uL (ref 150–440)
RBC: 5.15 MIL/uL (ref 4.40–5.90)
RDW: 14 % (ref 11.5–14.5)
WBC: 9.1 10*3/uL (ref 3.8–10.6)

## 2016-02-14 LAB — COMPREHENSIVE METABOLIC PANEL
ALBUMIN: 3.2 g/dL — AB (ref 3.5–5.0)
ALT: 21 U/L (ref 17–63)
ANION GAP: 6 (ref 5–15)
AST: 22 U/L (ref 15–41)
Alkaline Phosphatase: 57 U/L (ref 38–126)
BILIRUBIN TOTAL: 0.4 mg/dL (ref 0.3–1.2)
BUN: 14 mg/dL (ref 6–20)
CO2: 21 mmol/L — ABNORMAL LOW (ref 22–32)
Calcium: 8.4 mg/dL — ABNORMAL LOW (ref 8.9–10.3)
Chloride: 111 mmol/L (ref 101–111)
Creatinine, Ser: 0.95 mg/dL (ref 0.61–1.24)
GFR calc Af Amer: >60 mL/min (ref >60)
Glucose, Bld: 119 mg/dL — ABNORMAL HIGH (ref 65–99)
POTASSIUM: 4.2 mmol/L (ref 3.5–5.1)
Sodium: 138 mmol/L (ref 135–145)
TOTAL PROTEIN: 5.6 g/dL — AB (ref 6.5–8.1)

## 2016-02-14 LAB — ETHANOL

## 2016-02-14 LAB — SALICYLATE LEVEL

## 2016-02-14 MED ORDER — MIRTAZAPINE 15 MG PO TABS: 15.0000 mg | ORAL_TABLET | Freq: Every day | ORAL | 0 refills | Status: DC

## 2016-02-14 NOTE — ED Notes (Signed)
Patient cooperative with nursing assessment. He was in the ED yesterday and was given referrals for outpatient treatment. Patient said that he came back today because he was having suicidal thoughts. Patient waiting for assessment by psychiatrist. Maintained on 15 minute checks and observation by security camera for safety.

## 2016-02-14 NOTE — ED Notes (Signed)
Pt denies SI/HI/AVH. Pt given discharge instructions including prescriptions and f/u appointments. Pt states understanding. Pt states receipt of all belongings.   

## 2016-02-14 NOTE — ED Notes (Signed)
Pt given sandwich tray and sprite.  

## 2016-02-14 NOTE — ED Notes (Addendum)
Pt verbalizes discharge instructions and followup care before leaving ED; ambulatory with steady gait; signature obtained before leaving ED, with paper chart

## 2016-02-14 NOTE — ED Notes (Signed)
Pt returning to hallway bed after ambulating outside unbeknownst to this nurse; calm and cooperative;

## 2016-02-14 NOTE — Consult Note (Signed)
[facility] Face-to-Face Psychiatry Consult   Reason for Consult:  Consult for this 35 year old man with a history of substance abuse and mood disorder. Referring Physician:  Quentin Cornwall Patient Identification: Guy Rodriguez MRN:  [id number] Principal Diagnosis: Substance induced mood disorder [facility]) Diagnosis:   Patient Active Problem List   Diagnosis Date Noted  . Substance induced mood disorder ([place]) [F19.94] 02/14/2016  . Alcohol abuse [F10.10] 02/14/2016  . Cocaine abuse [F14.10] 02/14/2016  . 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 male patient admitted with "I've just not been feeling good".  HPI:  Patient interviewed. Chart reviewed. Labs and vitals reviewed. Case discussed with emergency room staff and physician. This 35 year old man came into the emergency room today saying that his mood was bad and he had suicidal thoughts. He was just here last night for similar circumstances and was discharged with referral to substance abuse treatment. He says that in the interim he talked to his probation officer and made some suicidal comments and so was referred back to the ER. Patient says his mood is been bad for the last couple months. That is the period of time during which she has been escalating his use of alcohol and cocaine. He sleeps very poorly at night. Feels down on himself a lot of the time. He says while he has had passing suicidal thoughts he has no intention or plan of acting on it. He really wants to get some help and get to feeling better. He is not currently taking any medicine for depression although he does go to a outpatient substance abuse treatment program twice a week. Reunite with his wife and to advance his living situation.  Medical history: No significant medical problems  Substance abuse history: Patient says he's been drinking for years but the cocaine use just started up about  3 months ago. Around that same period of time he and his wife have not been living together he's going to an outpatient substance abuse program but prior to that had never seriously stopped drinking or using drugs. No history of seizures or delirium tremens.  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 He and his wife are not currently living together because of finances. He has several children by 2 different women. Feels some sense of connection to his family.  Past Psychiatric History: No history of psychiatric hospitalization. No history of suicide attempts. He was prescribed Lexapro by his primary care doctor but did not find it helpful.  Risk to Self: Is patient at risk for suicide?: Yes Risk to Others:   Prior Inpatient Therapy:   Prior Outpatient Therapy:    Past Medical History:  Past Medical History:  Diagnosis Date  . ETOH abuse   . History of participation in smoking cessation counseling   . Hypertension     Past Surgical History:  Procedure Laterality Date  . APPENDECTOMY    . clavicale surgery Left    Family History: History reviewed. No pertinent family history. Family Psychiatric  History: Patient denies any family history of mental health or substance abuse problems Social History:  History  Alcohol Use  . Yes     History  Drug use: Unknown    Social History   Social History  . Marital status: Married    Spouse name: N/A  . Number of children: N/A  . Years of education: N/A  Social History Main Topics  . Smoking status: Current Every Day Smoker    Packs/day: 0.50    Types: Cigarettes  . Smokeless tobacco: None  . Alcohol use Yes  . Drug use: Unknown  . Sexual activity: Not Asked   Other Topics Concern  . None   Social History Narrative  . None   Additional Social History:    Allergies:  No Known Allergies  Labs:  Results for orders placed or performed during the hospital encounter of 02/14/16 (from the past 48 hour(s))   Comprehensive metabolic panel     Status: Abnormal   Collection Time: 02/14/16  2:08 PM  Result Value Ref Range   Sodium 138 135 - 145 mmol/L   Potassium 4.2 3.5 - 5.1 mmol/L   Chloride 111 101 - 111 mmol/L   CO2 21 (L) 22 - 32 mmol/L   Glucose, Bld 119 (H) 65 - 99 mg/dL   BUN 14 6 - 20 mg/dL   Creatinine, Ser 4.96 0.61 - 1.24 mg/dL   Calcium 8.4 (L) 8.9 - 10.3 mg/dL   Total Protein 5.6 (L) 6.5 - 8.1 g/dL   Albumin 3.2 (L) 3.5 - 5.0 g/dL   AST 22 15 - 41 U/L   ALT 21 17 - 63 U/L   Alkaline Phosphatase 57 38 - 126 U/L   Total Bilirubin 0.4 0.3 - 1.2 mg/dL   GFR calc non Af Amer >60 >60 mL/min   GFR calc Af Amer >60 >60 mL/min    Comment: (NOTE) The eGFR has been calculated using the CKD EPI equation. This calculation has not been validated in all clinical situations. eGFR's persistently <60 mL/min signify possible Chronic Kidney Disease.    Anion gap 6 5 - 15  Ethanol     Status: None   Collection Time: 02/14/16  2:08 PM  Result Value Ref Range   Alcohol, Ethyl (B) <5 <5 mg/dL    Comment:        LOWEST DETECTABLE LIMIT FOR SERUM ALCOHOL IS 5 mg/dL FOR MEDICAL PURPOSES ONLY   Salicylate level     Status: None   Collection Time: 02/14/16  2:08 PM  Result Value Ref Range   Salicylate Lvl <4.0 2.8 - 30.0 mg/dL  Acetaminophen level     Status: Abnormal   Collection Time: 02/14/16  2:08 PM  Result Value Ref Range   Acetaminophen (Tylenol), Serum <10 (L) 10 - 30 ug/mL    Comment:        THERAPEUTIC CONCENTRATIONS VARY SIGNIFICANTLY. A RANGE OF 10-30 ug/mL MAY BE AN EFFECTIVE CONCENTRATION FOR MANY PATIENTS. HOWEVER, SOME ARE BEST TREATED AT CONCENTRATIONS OUTSIDE THIS RANGE. ACETAMINOPHEN CONCENTRATIONS >150 ug/mL AT 4 HOURS AFTER INGESTION AND >50 ug/mL AT 12 HOURS AFTER INGESTION ARE OFTEN ASSOCIATED WITH TOXIC REACTIONS.   cbc     Status: None   Collection Time: 02/14/16  2:08 PM  Result Value Ref Range   WBC 9.1 3.8 - 10.6 K/uL   RBC 5.15 4.40 - 5.90 MIL/uL    Hemoglobin 16.5 13.0 - 18.0 g/dL   HCT 11.6 43.5 - 39.1 %   MCV 93.0 80.0 - 100.0 fL   MCH 32.1 26.0 - 34.0 pg   MCHC 34.5 32.0 - 36.0 g/dL   RDW 22.5 83.4 - 62.1 %   Platelets 212 150 - 440 K/uL  Urine Drug Screen, Qualitative ([facility] only)     Status: Abnormal   Collection Time: 02/14/16  2:08 PM  Result Value Ref Range  Tricyclic, Ur Screen NONE DETECTED NONE DETECTED   Amphetamines, Ur Screen NONE DETECTED NONE DETECTED   MDMA (Ecstasy)Ur Screen NONE DETECTED NONE DETECTED   Cocaine Metabolite,Ur [place] POSITIVE (A) NONE DETECTED   Opiate, Ur Screen NONE DETECTED NONE DETECTED   Phencyclidine (PCP) Ur S NONE DETECTED NONE DETECTED   Cannabinoid 50 Ng, Ur [place] NONE DETECTED NONE DETECTED   Barbiturates, Ur Screen NONE DETECTED NONE DETECTED   Benzodiazepine, Ur Scrn NONE DETECTED NONE DETECTED   Methadone Scn, Ur NONE DETECTED NONE DETECTED    Comment: (NOTE) 195  Tricyclics, urine               Cutoff 1000 ng/mL 200  Amphetamines, urine             Cutoff 1000 ng/mL 300  MDMA (Ecstasy), urine           Cutoff 500 ng/mL 400  Cocaine Metabolite, urine       Cutoff 300 ng/mL 500  Opiate, urine                   Cutoff 300 ng/mL 600  Phencyclidine (PCP), urine      Cutoff 25 ng/mL 700  Cannabinoid, urine              Cutoff 50 ng/mL 800  Barbiturates, urine             Cutoff 200 ng/mL 900  Benzodiazepine, urine           Cutoff 200 ng/mL 1000 Methadone, urine                Cutoff 300 ng/mL 1100 1200 The urine drug screen provides only a preliminary, unconfirmed 1300 analytical test result and should not be used for non-medical 1400 purposes. Clinical consideration and professional judgment should 1500 be applied to any positive drug screen result due to possible 1600 interfering substances. A more specific alternate chemical method 1700 must be used in order to obtain a confirmed analytical result.  1800 Gas chromato graphy / mass spectrometry (GC/MS) is the preferred 1900  confirmatory method.     No current facility-administered medications for this encounter.    Current Outpatient Prescriptions  Medication Sig Dispense Refill  . ranitidine (ZANTAC) 300 MG tablet Take 150 mg by mouth 2 (two) times daily.     Marland Kitchen aspirin EC 81 MG tablet Take 81 mg by mouth daily.    . chlordiazePOXIDE (LIBRIUM) 10 MG capsule Take 1 capsule (10 mg total) by mouth 3 (three) times daily as needed for anxiety. (Patient not taking: Reported on 02/14/2016) 10 capsule 0  . escitalopram (LEXAPRO) 10 MG tablet Take 10 mg by mouth daily.    Marland Kitchen gabapentin (NEURONTIN) 600 MG tablet Take by mouth 3 (three) times daily.     Marland Kitchen lisinopril (PRINIVIL,ZESTRIL) 10 MG tablet Take 10 mg by mouth daily.    . methocarbamol (ROBAXIN) 500 MG tablet Take 1 tablet (500 mg total) by mouth every 6 (six) hours as needed for muscle spasms. (Patient not taking: Reported on 02/14/2016) 30 tablet 0  . mirtazapine (REMERON) 15 MG tablet Take 1 tablet (15 mg total) by mouth at bedtime. 30 tablet 0  . varenicline (CHANTIX PAK) 0.5 MG X 11 & 1 MG X 42 tablet Take by mouth 2 (two) times daily. Take one 0.5 mg tablet by mouth once daily for 3 days, then increase to one 0.5 mg tablet twice daily for 4 days, then increase to one  1 mg tablet twice daily.      Musculoskeletal: Strength & Muscle Tone: within normal limits Gait & Station: normal Patient leans: N/A  Psychiatric Specialty Exam: Physical Exam  Nursing note and vitals reviewed. Constitutional: He appears well-developed and well-nourished.  HENT:  Head: Normocephalic and atraumatic.  Eyes: Conjunctivae are normal. Pupils are equal, round, and reactive to light.  Neck: Normal range of motion.  Cardiovascular: Regular rhythm and normal heart sounds.   Respiratory: Effort normal. No respiratory distress.  GI: Soft.  Musculoskeletal: Normal range of motion.  Neurological: He is alert.  Skin: Skin is warm and dry.  Psychiatric: His speech is normal and  behavior is normal. Judgment normal. Cognition and memory are normal. He exhibits a depressed mood. He expresses suicidal ideation. He expresses no suicidal plans.    Review of Systems  Constitutional: Negative.   HENT: Negative.   Eyes: Negative.   Respiratory: Negative.   Cardiovascular: Negative.   Gastrointestinal: Negative.   Musculoskeletal: Negative.   Skin: Negative.   Neurological: Negative.   Psychiatric/Behavioral: Positive for depression and substance abuse. Negative for hallucinations, memory loss and suicidal ideas. The patient is nervous/anxious and has insomnia.     Blood pressure (!) 141/92, pulse 78, temperature 98.1 F (36.7 C), temperature source Oral, resp. rate 18, height '6\' 1"'$  (1.854 m), weight 96.2 kg (212 lb), SpO2 98 %.Body mass index is 27.97 kg/m.  General Appearance: Casual  Eye Contact:  Good  Speech:  Clear and Coherent  Volume:  Normal  Mood:  Dysphoric  Affect:  Congruent  Thought Process:  Coherent  Orientation:  Full (Time, Place, and Person)  Thought Content:  Logical  Suicidal Thoughts:  Yes.  without intent/plan  Homicidal Thoughts:  No  Memory:  Immediate;   Good Recent;   Good Remote;   Fair  Judgement:  Fair  Insight:  Fair  Psychomotor Activity:  Normal  Concentration:  Concentration: Fair  Recall:  [employer] of Knowledge:  Fair  Language:  Fair  Akathisia:  No  Handed:  Right  AIMS (if indicated):     Assets:  Communication Skills Desire for Improvement Housing Physical Health Resilience  ADL's:  Intact  Cognition:  WNL  Sleep:        Treatment Plan Summary: Medication management and Plan 35 year old man with depression symptoms and active substance abuse problems. On further conversation it is clear that he has no intention or plan of acting on harming himself. His main goal is to try and get his substance abuse problem under control so that he can reunite with his wife. There is no sign of psychosis or agitated  behavior. He reports being cooperative with outpatient treatment. I don't think he meets commitment criteria nor does he require inpatient treatment. Encouragement and review of treatment plan including going back to [facility] and following up closely as well as getting in to see a doctor. I propose giving him a prescription for mirtazapine 15 mg at night to help with sleep and mood. Patient is agreeable to that. Case reviewed with the ER doctor. IVC discontinued.  Disposition: Patient does not meet criteria for psychiatric inpatient admission. Supportive therapy provided about ongoing stressors.  Alethia Berthold, MD 02/14/2016 7:46 PM

## 2016-02-14 NOTE — ED Notes (Signed)

## 2016-02-14 NOTE — ED Provider Notes (Signed)
[facility] Emergency Department Provider Note    First MD Initiated Contact with Patient 02/14/16 1530     (approximate)  I have reviewed the triage vital signs and the nursing notes.   HISTORY  Chief Complaint Suicidal    HPI Guy Rodriguez is a 35 y.o. male with a history of alcohol abuse and recent incarceration roughly 5 months ago presents to the ER with complaint of suicidal ideations. Patient presented to the ER yesterday seeking help with detox. Since he was discharged for outpatient management states that he's been feeling more depressed and started having increased frequency of thoughts of overdosing on his gabapentin.  Patient denies any previous history of suicidal ideation. He denies any hallucinations. States that he does have a history of depression but is not currently on any medications and he states that his PCP started him on a medication that was not helping. Denies any family history of psychiatric illness. Denies any homicidal ideation.   Past Medical History:  Diagnosis Date  . ETOH abuse   . History of participation in smoking cessation counseling   . Hypertension     There are no active problems to display for this patient.   Past Surgical History:  Procedure Laterality Date  . APPENDECTOMY    . clavicale surgery Left     Prior to Admission medications   Medication Sig Start Date End Date Taking? Authorizing Provider  amoxicillin (AMOXIL) 500 MG tablet Take 500 mg by mouth 2 (two) times daily.    Historical Provider, MD  aspirin EC 81 MG tablet Take 81 mg by mouth daily.    Historical Provider, MD  chlordiazePOXIDE (LIBRIUM) 10 MG capsule Take 1 capsule (10 mg total) by mouth 3 (three) times daily as needed for anxiety. 02/13/16   Sharman CheekPhillip Stafford, MD  escitalopram (LEXAPRO) 10 MG tablet Take 10 mg by mouth daily.    Historical Provider, MD  gabapentin (NEURONTIN) 600 MG tablet Take 600 mg by mouth 3 (three) times daily.     Historical Provider, MD  HYDROcodone-acetaminophen (NORCO/VICODIN) 5-325 MG tablet Take 1-2 tablets by mouth every 6 (six) hours as needed. 10/12/15   Lutricia FeilWilliam P Roemer, PA-C  lisinopril (PRINIVIL,ZESTRIL) 10 MG tablet Take 10 mg by mouth daily.    Historical Provider, MD  meloxicam (MOBIC) 15 MG tablet Take 1 tablet (15 mg total) by mouth daily. 01/22/16   Charmayne Sheerharles M Beers, PA-C  methocarbamol (ROBAXIN) 500 MG tablet Take 1 tablet (500 mg total) by mouth every 6 (six) hours as needed for muscle spasms. 01/22/16   Charmayne Sheerharles M Beers, PA-C  ondansetron (ZOFRAN ODT) 8 MG disintegrating tablet Take 1 tablet (8 mg total) by mouth 2 (two) times daily. 11/11/15   Lutricia FeilWilliam P Roemer, PA-C  ranitidine (ZANTAC) 300 MG tablet Take 300 mg by mouth 2 (two) times daily.    Historical Provider, MD  varenicline (CHANTIX PAK) 0.5 MG X 11 & 1 MG X 42 tablet Take by mouth 2 (two) times daily. Take one 0.5 mg tablet by mouth once daily for 3 days, then increase to one 0.5 mg tablet twice daily for 4 days, then increase to one 1 mg tablet twice daily.    Historical Provider, MD    Allergies Review of patient's allergies indicates no known allergies.  History reviewed. No pertinent family history.  Social History Social History  Substance Use Topics  . Smoking status: Current Every Day Smoker    Packs/day: 0.50    Types:  Cigarettes  . Smokeless tobacco: Not on file  . Alcohol use Yes    Review of Systems Patient denies headaches, rhinorrhea, blurry vision, numbness, shortness of breath, chest pain, edema, cough, abdominal pain, nausea, vomiting, diarrhea, dysuria, fevers, rashes or hallucinations unless otherwise stated above in HPI. ____________________________________________   PHYSICAL EXAM:  VITAL SIGNS: Vitals:   02/14/16 1405  BP: (!) 141/92  Pulse: 78  Resp: 18  Temp: 98.1 F (36.7 C)    Constitutional: Alert and oriented. Well appearing and in no acute distress. Eyes: Conjunctivae are normal. PERRL.  EOMI. Head: Atraumatic. Nose: No congestion/rhinnorhea. Mouth/Throat: Mucous membranes are moist.  Oropharynx non-erythematous. Neck: No stridor. Painless ROM. No cervical spine tenderness to palpation Hematological/Lymphatic/Immunilogical: No cervical lymphadenopathy. Cardiovascular: Normal rate, regular rhythm. Grossly normal heart sounds.  Good peripheral circulation. Respiratory: Normal respiratory effort.  No retractions. Lungs CTAB. Gastrointestinal: Soft and nontender. No distention. No abdominal bruits. No CVA tenderness. Genitourinary:  Musculoskeletal: No lower extremity tenderness nor edema.  No joint effusions. Neurologic:  Normal speech and language. No gross focal neurologic deficits are appreciated. No gait instability. Skin:  Skin is warm, dry and intact. No rash noted. Psychiatric: Mood depressed and affect is depressed. Speech and behavior are normal.  ____________________________________________   LABS (all labs ordered are listed, but only abnormal results are displayed)  Results for orders placed or performed during the hospital encounter of 02/14/16 (from the past 24 hour(s))  Comprehensive metabolic panel     Status: Abnormal   Collection Time: 02/14/16  2:08 PM  Result Value Ref Range   Sodium 138 135 - 145 mmol/L   Potassium 4.2 3.5 - 5.1 mmol/L   Chloride 111 101 - 111 mmol/L   CO2 21 (L) 22 - 32 mmol/L   Glucose, Bld 119 (H) 65 - 99 mg/dL   BUN 14 6 - 20 mg/dL   Creatinine, Ser 7.82 0.61 - 1.24 mg/dL   Calcium 8.4 (L) 8.9 - 10.3 mg/dL   Total Protein 5.6 (L) 6.5 - 8.1 g/dL   Albumin 3.2 (L) 3.5 - 5.0 g/dL   AST 22 15 - 41 U/L   ALT 21 17 - 63 U/L   Alkaline Phosphatase 57 38 - 126 U/L   Total Bilirubin 0.4 0.3 - 1.2 mg/dL   GFR calc non Af Amer >60 >60 mL/min   GFR calc Af Amer >60 >60 mL/min   Anion gap 6 5 - 15  Ethanol     Status: None   Collection Time: 02/14/16  2:08 PM  Result Value Ref Range   Alcohol, Ethyl (B) <5 <5 mg/dL  Salicylate  level     Status: None   Collection Time: 02/14/16  2:08 PM  Result Value Ref Range   Salicylate Lvl <4.0 2.8 - 30.0 mg/dL  Acetaminophen level     Status: Abnormal   Collection Time: 02/14/16  2:08 PM  Result Value Ref Range   Acetaminophen (Tylenol), Serum <10 (L) 10 - 30 ug/mL  cbc     Status: None   Collection Time: 02/14/16  2:08 PM  Result Value Ref Range   WBC 9.1 3.8 - 10.6 K/uL   RBC 5.15 4.40 - 5.90 MIL/uL   Hemoglobin 16.5 13.0 - 18.0 g/dL   HCT 95.6 21.3 - 08.6 %   MCV 93.0 80.0 - 100.0 fL   MCH 32.1 26.0 - 34.0 pg   MCHC 34.5 32.0 - 36.0 g/dL   RDW 57.8 46.9 - 62.9 %  Platelets 212 150 - 440 K/uL   ____________________________________________  EKG  ___________________________________  RADIOLOGY   ____________________________________________   PROCEDURES  Procedure(s) performed: none    Critical Care performed: no ____________________________________________   INITIAL IMPRESSION / ASSESSMENT AND PLAN / ED COURSE  Pertinent labs & imaging results that were available during my care of the patient were reviewed by me and considered in my medical decision making (see chart for details).  DDX depression, anxiety, psychosis, alcohol withdrawal, suicidal ideation  Diago Haik is a 35 y.o. who presents to the ED with history of alcohol abuse and suicidal ideation with plan. Patient has undergone multiple stressors at home including unstable home I've. No evidence of other acute medical needs at this time. Have consulted psychiatry as well as TTS for evaluation. Patient arrives as an IVC from [employer] police.  Clinical Course  Comment By Time  Patient is medically clear for psychiatric evaluation. Willy Eddy, MD 08/09 1556   Patient was evaluated by psychiatry and person. Patient wanted another facility. At this time he has clear sensorium states that he had been undergoing multiple stressors but denies true plan for suicide. He reiterates that  he was having these thoughts during a time of stress.  Patient for resources for follow-up with psychiatry as an outpatient. Patient requesting discharge home. After discussion with Dr.Clapacs feel patient is appropriate for discharge home.  Have discussed with the patient and available family all diagnostics and treatments performed thus far and all questions were answered to the best of my ability. The patient demonstrates understanding and agreement with plan.   ____________________________________________   FINAL CLINICAL IMPRESSION(S) / ED DIAGNOSES  Final diagnoses:  Depression      NEW MEDICATIONS STARTED DURING THIS VISIT:  New Prescriptions   No medications on file     Note:  This document was prepared using Dragon voice recognition software and may include unintentional dictation errors.     Willy Eddy, MD 02/14/16 [phone]

## 2016-02-14 NOTE — BH Assessment (Signed)
Assessment Note  Guy Rodriguez is an 35 y.o. male presenting to the ED stating that his mood was bad and endorsing suicidal thoughts.  Patient was previously seen in the ED last night for the same and was discharged with a referral for substance abuse treatment.  He says that he went to report to his probation officer and made some suicidal comments and referred back to the ED for evaluation.  Pt denies any active suicidal plans.  Pt has no prior history of mental health hospitalization.  He denies any auditory/visual hallucinations.  Diagnosis: Substance induced mood disorder  Past Medical History:  Past Medical History:  Diagnosis Date  . ETOH abuse   . History of participation in smoking cessation counseling   . Hypertension     Past Surgical History:  Procedure Laterality Date  . APPENDECTOMY    . clavicale surgery Left     Family History: History reviewed. No pertinent family history.  Social History:  reports that he has been smoking Cigarettes.  He has been smoking about 0.50 packs per day. He does not have any smokeless tobacco history on file. He reports that he drinks alcohol. His drug history is not on file.  Additional Social History:  Alcohol / Drug Use History of alcohol / drug use?: Yes Longest period of sobriety (when/how long): can't remember Negative Consequences of Use: Personal relationships, Work / Programmer, multimedia, Armed forces operational officer Substance #1 Name of Substance 1: Alcohol 1 - Age of First Use: can't remember 1 - Amount (size/oz): varies 1 - Frequency: years 1 - Duration: daily 1 - Last Use / Amount: today Substance #2 Name of Substance 2: cocaine 2 - Age of First Use: 35 yo 2 - Amount (size/oz): varies 2 - Frequency: daily 2 - Duration: 3 months 2 - Last Use / Amount: today  CIWA: CIWA-Ar BP: (!) 141/92 Pulse Rate: 78 COWS:    Allergies: No Known Allergies  Home Medications:  (Not in a hospital admission)  OB/GYN Status:  No LMP for male patient.  General  Assessment Data Location of Assessment: [facility] ED TTS Assessment: In system Is this a Tele or Face-to-Face Assessment?: Face-to-Face Is this an Initial Assessment or a Re-assessment for this encounter?: Initial Assessment Marital status: Married [place] name: n/a Is patient pregnant?: No Pregnancy Status: No Living Arrangements: Other (Comment) Can pt return to current living arrangement?: Yes Admission Status: Involuntary Is patient capable of signing voluntary admission?: Yes Referral Source: Other Insurance type: self pay  Medical Screening Exam [facility] Walk-in ONLY) Medical Exam completed: Yes  Crisis Care Plan Living Arrangements: Other (Comment) Legal Guardian: Other: (self) Name of Psychiatrist: [facility] Name of Therapist: [facility]  Education Status Is patient currently in school?: No Current Grade: n/a Highest grade of school patient has completed: n/a Name of school: n/a Contact person: n/a  Risk to self with the past 6 months Suicidal Ideation: Yes-Currently Present Has patient been a risk to self within the past 6 months prior to admission? : No Suicidal Intent: No Has patient had any suicidal intent within the past 6 months prior to admission? : No Is patient at risk for suicide?: No Suicidal Plan?: No Has patient had any suicidal plan within the past 6 months prior to admission? : No Access to Means: No What has been your use of drugs/alcohol within the last 12 months?: cociane, alcohol Previous Attempts/Gestures: No How many times?: 0 Other Self Harm Risks: none identified Triggers for Past Attempts: None known Intentional Self Injurious Behavior: None  Family Suicide History: No Recent stressful life event(s): Conflict (Comment) Persecutory voices/beliefs?: No Depression: Yes Depression Symptoms: Loss of interest in usual pleasures, Feeling worthless/self pity Substance abuse history and/or treatment for substance abuse?: Yes Suicide prevention information given to  non-admitted patients: Not applicable  Risk to Others within the past 6 months Homicidal Ideation: No Does patient have any lifetime risk of violence toward others beyond the six months prior to admission? : No Thoughts of Harm to Others: No Current Homicidal Intent: No Current Homicidal Plan: No Access to Homicidal Means: No Identified Victim: none identified History of harm to others?: No Assessment of Violence: None Noted Violent Behavior Description: none identified Does patient have access to weapons?: No Criminal Charges Pending?: No Does patient have a court date: No Is patient on probation?: Yes  Psychosis Hallucinations: None noted Delusions: None noted  Mental Status Report Appearance/Hygiene: In scrubs Eye Contact: Good Motor Activity: Freedom of movement Speech: Logical/coherent Level of Consciousness: Alert Mood: Anxious Affect: Appropriate to circumstance Anxiety Level: Minimal Thought Processes: Coherent, Relevant Judgement: Partial Orientation: Person, Place, Time, Situation Obsessive Compulsive Thoughts/Behaviors: None  Cognitive Functioning Concentration: Normal Memory: Recent Intact, Remote Intact IQ: Average Insight: Fair Impulse Control: Fair Appetite: Good Weight Loss: 0 Weight Gain: 0 Sleep: No Change Total Hours of Sleep: 8 Vegetative Symptoms: None  ADLScreening [facility] Assessment Services) Patient's cognitive ability adequate to safely complete daily activities?: Yes Patient able to express need for assistance with ADLs?: Yes Independently performs ADLs?: Yes (appropriate for developmental age)  Prior Inpatient Therapy Prior Inpatient Therapy: No Prior Therapy Dates: n/a Prior Therapy Facilty/Provider(s): n/a Reason for Treatment: n/a  Prior Outpatient Therapy Prior Outpatient Therapy: No Prior Therapy Dates: n/a Prior Therapy Facilty/Provider(s): n/a Reason for Treatment: n/a Does patient have an ACCT team?: No Does patient  have Intensive In-House Services?  : No Does patient have [facility] services? : No Does patient have P4CC services?: No  ADL Screening (condition at time of admission) Patient's cognitive ability adequate to safely complete daily activities?: Yes Patient able to express need for assistance with ADLs?: Yes Independently performs ADLs?: Yes (appropriate for developmental age)       Abuse/Neglect Assessment (Assessment to be complete while patient is alone) Physical Abuse: Denies Verbal Abuse: Denies Sexual Abuse: Denies Exploitation of patient/patient's resources: Denies Self-Neglect: Denies Values / Beliefs Cultural Requests During Hospitalization: None Spiritual Requests During Hospitalization: None Consults Spiritual Care Consult Needed: No Social Work Consult Needed: No Merchant navy officerAdvance Directives (For Healthcare) Does patient have an advance directive?: No Would patient like information on creating an advanced directive?: No - patient declined information    Additional Information 1:1 In Past 12 Months?: No CIRT Risk: No Elopement Risk: No Does patient have medical clearance?: Yes     Disposition:  Disposition Initial Assessment Completed for this Encounter: Yes Disposition of Patient: Outpatient treatment Type of outpatient treatment: Adult (Pt provided resources for substance abuse outpatient treatme)  On Site Evaluation by:   Reviewed with Physician:    Artist Beachoxana C Jazzmine Kleiman 02/14/2016 8:45 PM

## 2016-02-14 NOTE — ED Triage Notes (Signed)
States depression and thoughts of SI by overdose, denies any HI, states hx of depression, states cocaine addiction, last used yesterday and drank ETOH yesterday, states he was seen here yesterday for detox

## 2016-02-14 NOTE — ED Notes (Signed)
Downtime documentation on nurses notes from 2345 on 02/13/2016 until pt discharged at 0037 on 02/14/2016

## 2016-03-06 ENCOUNTER — Encounter: Payer: Self-pay | Admitting: Emergency Medicine

## 2016-03-06 ENCOUNTER — Emergency Department
Admission: EM | Admit: 2016-03-06 | Discharge: 2016-03-06 | Disposition: A | Discharge status: Home / Self Care | Payer: Medicaid Other | Attending: Emergency Medicine | Admitting: Emergency Medicine

## 2016-03-06 DIAGNOSIS — L03116 Cellulitis of left lower limb: Secondary | ICD-10-CM | POA: Diagnosis not present

## 2016-03-06 DIAGNOSIS — I10 Essential (primary) hypertension: Secondary | ICD-10-CM | POA: Diagnosis not present

## 2016-03-06 DIAGNOSIS — F17210 Nicotine dependence, cigarettes, uncomplicated: Secondary | ICD-10-CM | POA: Insufficient documentation

## 2016-03-06 DIAGNOSIS — B880 Other acariasis: Secondary | ICD-10-CM | POA: Diagnosis not present

## 2016-03-06 DIAGNOSIS — R21 Rash and other nonspecific skin eruption: Secondary | ICD-10-CM | POA: Diagnosis present

## 2016-03-06 DIAGNOSIS — Z79899 Other long term (current) drug therapy: Secondary | ICD-10-CM | POA: Diagnosis not present

## 2016-03-06 DIAGNOSIS — Z7982 Long term (current) use of aspirin: Secondary | ICD-10-CM | POA: Insufficient documentation

## 2016-03-06 MED ORDER — DOXYCYCLINE HYCLATE 50 MG PO CAPS: 50.0000 mg | ORAL_CAPSULE | Freq: Two times a day (BID) | ORAL | 0 refills | Status: AC

## 2016-03-06 MED ORDER — IBUPROFEN 800 MG PO TABS
800.0000 mg | ORAL_TABLET | Freq: Once | ORAL | Status: AC
  Administered 2016-03-06: 800 mg via ORAL

## 2016-03-06 MED ORDER — IBUPROFEN 800 MG PO TABS: 800.0000 mg | ORAL_TABLET | Freq: Three times a day (TID) | ORAL | 0 refills | Status: DC | PRN

## 2016-03-06 MED ORDER — DOXYCYCLINE HYCLATE 100 MG PO TABS
100.0000 mg | ORAL_TABLET | Freq: Once | ORAL | Status: AC
  Administered 2016-03-06: 100 mg via ORAL

## 2016-03-06 MED ORDER — IBUPROFEN 800 MG PO TABS
ORAL_TABLET | ORAL | Status: AC
  Administered 2016-03-06: 800 mg via ORAL
  Filled 2016-03-06: qty 1

## 2016-03-06 MED ORDER — DOXYCYCLINE HYCLATE 100 MG PO TABS
ORAL_TABLET | ORAL | Status: AC
  Administered 2016-03-06: 100 mg via ORAL
  Filled 2016-03-06: qty 1

## 2016-03-06 NOTE — Discharge Instructions (Signed)
You may apply ice to your rash for 10 minutes every two hours to decrease swelling and pain.  Keep your leg elevated as much as possible when you are not working.  Return to the emergency department for worsening redness, increased swelling, drainage of pus, fever, inability to keep down fluids, or any other symptoms concerning to you.

## 2016-03-06 NOTE — ED Notes (Signed)
Pt. Going home with wife. 

## 2016-03-06 NOTE — ED Notes (Signed)
Case discussed with Dr. Scotty CourtStafford, no orders given at this time.

## 2016-03-06 NOTE — ED Provider Notes (Signed)
[facility] Emergency Department Provider Note  ____________________________________________  Time seen: Approximately 10:01 PM  I have reviewed the triage vital signs and the nursing notes.   HISTORY  Chief Complaint Leg Pain    HPI Guy Rodriguez is a 35 y.o. male presenting with multiple chigger bites and an area of erythema, warmth, and tenderness on the left medial thigh. The patient reports that several days ago he went out into the woods, and had multiple chigger bites. Since then, he has noticed an area overlying a varicose vein on the left medial thigh that has become progressively more tender, warm, and erythematous. He has not had any systemic symptoms including fever, chills, nausea vomiting. No known tick bites. No headache.   Past Medical History:  Diagnosis Date  . ETOH abuse   . History of participation in smoking cessation counseling   . Hypertension     Patient Active Problem List   Diagnosis Date Noted  . Substance induced mood disorder (HCC) 02/14/2016  . Alcohol abuse 02/14/2016  . Cocaine abuse 02/14/2016  . 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 APPENDECTOMY    . clavicale surgery Left     Current Outpatient Rx  . Order #: [id number] Class: Historical Med  . Order #: [id number] Class: Print  . Order #: [id number] Class: Print  . Order #: [id number] Class: Historical Med  . Order #: [id number] Class: Historical Med  . Order #: [id number] Class: Print  . Order #: [id number] Class: Historical Med  . Order #: [id number] Class: Print  . Order #: [id number] Class: Print  . Order #: [id number] Class: Historical Med  . Order #: [id number] Class: Historical Med    Allergies Review of patient's allergies indicates no known allergies.  History reviewed. No pertinent family history.  Social History Social History  Substance Use Topics  . Smoking status: Current Every Day Smoker     Packs/day: 0.50    Types: Cigarettes  . Smokeless tobacco: Never Used  . Alcohol use Yes    Review of Systems Constitutional: No fever/chills. No lightheadedness or syncope. Eyes: No visual changes. ENT: No sore throat. No congestion or rhinorrhea. Cardiovascular: Denies chest pain.  Respiratory: Denies shortness of breath.  No cough. Gastrointestinal: No abdominal pain.  No nausea, no vomiting.  No diarrhea.  No constipation. Genitourinary: Negative for dysuria. Musculoskeletal: Negative for back pain. Skin: Positive for rash. Neurological: Negative for headaches. No focal numbness, tingling or weakness.   10-point ROS otherwise negative.  ____________________________________________   PHYSICAL EXAM:  VITAL SIGNS: ED Triage Vitals  Enc Vitals Group     BP 03/06/16 1913 (!) 156/103     Pulse Rate 03/06/16 1913 (!) 103     Resp 03/06/16 1913 18     Temp 03/06/16 1913 98.2 F (36.8 C)     Temp Source 03/06/16 1913 Oral     SpO2 03/06/16 1913 97 %     Weight 03/06/16 1913 198 lb (89.8 kg)     Height 03/06/16 1913 6\' 1"  (1.854 m)     Head Circumference --      Peak Flow --      Pain Score 03/06/16 1914 8     Pain Loc --      Pain Edu? --      Excl. in GC? --     Constitutional: Alert and oriented. Well appearing and in no acute distress. Answers questions appropriately. Eyes: Conjunctivae are normal.  EOMI. No scleral icterus. Head:  Atraumatic. Nose: No congestion/rhinnorhea. Mouth/Throat: Mucous membranes are moist.  Neck: No stridor.  Supple.  No meningismus. Cardiovascular: Normal rate,  Respiratory: Normal respiratory effort.  Musculoskeletal: No LE edema.  Neurologic:  A&Ox3.  Speech is clear.  Face and smile are symmetric.  EOMI.  Moves all extremities well. Normal gait. Skin:  Patient has a 15 x 15 cm circular confluent area of erythema on the left medial thigh not involving the inguinal crease that is warm to touch, tender to palpation, but without drainage  or fluctuance. It does overlie a varicosity. He also has multiple punctate scabbed wounds diffusely on the lower extremities bilaterally. Psychiatric: Mood and affect are normal. Speech and behavior are normal.  Normal judgement.  ____________________________________________   LABS (all labs ordered are listed, but only abnormal results are displayed)  Labs Reviewed - No data to display ____________________________________________  EKG  Not indicated ____________________________________________  RADIOLOGY  No results found.  ____________________________________________   PROCEDURES  Procedure(s) performed: None  Procedures  Critical Care performed: No ____________________________________________   INITIAL IMPRESSION / ASSESSMENT AND PLAN / ED COURSE  Pertinent labs & imaging results that were available during my care of the patient were reviewed by me and considered in my medical decision making (see chart for details).  35 y.o. male with multiple bites to the lower extremities, now with a left medial thigh cellulitis but no evidence of abscess or systemic illness. I'll plan to treat him with doxycycline given that he was the woods, even though he did not see a tick bite. He will follow-up with his primary care physician for reevaluation. Return precautions were discussed.  ____________________________________________  FINAL CLINICAL IMPRESSION(S) / ED DIAGNOSES  Final diagnoses:  Cellulitis of left thigh  Chigger bites    Clinical Course      NEW MEDICATIONS STARTED DURING THIS VISIT:  New Prescriptions   DOXYCYCLINE (VIBRAMYCIN) 50 MG CAPSULE    Take 1 capsule (50 mg total) by mouth 2 (two) times daily.   IBUPROFEN (ADVIL,MOTRIN) 800 MG TABLET    Take 1 tablet (800 mg total) by mouth every 8 (eight) hours as needed.      Rockne MenghiniAnne-Caroline Kassaundra Hair, MD 03/06/16 2205

## 2016-03-06 NOTE — ED Triage Notes (Signed)
PT to triage via WC, reports pain to left upper leg, area of redness/swelling noted to inner thigh.  Pt reports pain, increased with standing/walking.  Pt reports redness started several days ago but has increased in past 1.5 days.  PT NAD at this time.

## 2016-03-06 NOTE — ED Notes (Signed)
Pt reports varicose veins in lower ext - the varicose vein on upper/inner aspect of left leg appears to be bruised/red/swollen - pt reports area started as a large bruise 1-2 weeks ago and has progressed into this area - area is warm to touch and painful - pt reports 2 weeks ago being bitten by multiple chiggers

## 2016-04-09 ENCOUNTER — Emergency Department: Payer: Medicaid Other

## 2016-04-09 ENCOUNTER — Emergency Department
Admission: EM | Admit: 2016-04-09 | Discharge: 2016-04-09 | Disposition: A | Discharge status: Home / Self Care | Payer: Medicaid Other | Attending: Emergency Medicine | Admitting: Emergency Medicine

## 2016-04-09 ENCOUNTER — Encounter: Payer: Self-pay | Admitting: Emergency Medicine

## 2016-04-09 DIAGNOSIS — R071 Chest pain on breathing: Secondary | ICD-10-CM | POA: Diagnosis not present

## 2016-04-09 DIAGNOSIS — Z79899 Other long term (current) drug therapy: Secondary | ICD-10-CM | POA: Insufficient documentation

## 2016-04-09 DIAGNOSIS — Z791 Long term (current) use of non-steroidal anti-inflammatories (NSAID): Secondary | ICD-10-CM | POA: Insufficient documentation

## 2016-04-09 DIAGNOSIS — Z7982 Long term (current) use of aspirin: Secondary | ICD-10-CM | POA: Diagnosis not present

## 2016-04-09 DIAGNOSIS — I10 Essential (primary) hypertension: Secondary | ICD-10-CM | POA: Insufficient documentation

## 2016-04-09 DIAGNOSIS — K228 Other specified diseases of esophagus: Secondary | ICD-10-CM

## 2016-04-09 DIAGNOSIS — Z716 Tobacco abuse counseling: Secondary | ICD-10-CM | POA: Insufficient documentation

## 2016-04-09 DIAGNOSIS — F17210 Nicotine dependence, cigarettes, uncomplicated: Secondary | ICD-10-CM | POA: Insufficient documentation

## 2016-04-09 DIAGNOSIS — K2289 Other specified disease of esophagus: Secondary | ICD-10-CM

## 2016-04-09 DIAGNOSIS — K229 Disease of esophagus, unspecified: Secondary | ICD-10-CM | POA: Diagnosis not present

## 2016-04-09 DIAGNOSIS — F17200 Nicotine dependence, unspecified, uncomplicated: Secondary | ICD-10-CM

## 2016-04-09 DIAGNOSIS — I82812 Embolism and thrombosis of superficial veins of left lower extremities: Secondary | ICD-10-CM | POA: Insufficient documentation

## 2016-04-09 DIAGNOSIS — R072 Precordial pain: Secondary | ICD-10-CM | POA: Diagnosis present

## 2016-04-09 DIAGNOSIS — M79605 Pain in left leg: Secondary | ICD-10-CM

## 2016-04-09 DIAGNOSIS — R079 Chest pain, unspecified: Secondary | ICD-10-CM

## 2016-04-09 LAB — BASIC METABOLIC PANEL
Anion gap: 4 — ABNORMAL LOW (ref 5–15)
BUN: 21 mg/dL — ABNORMAL HIGH (ref 6–20)
CALCIUM: 8.4 mg/dL — AB (ref 8.9–10.3)
CO2: 26 mmol/L (ref 22–32)
CREATININE: 1.06 mg/dL (ref 0.61–1.24)
Chloride: 111 mmol/L (ref 101–111)
GFR calc Af Amer: >60 mL/min (ref >60)
GFR calc non Af Amer: >60 mL/min (ref >60)
GLUCOSE: 125 mg/dL — AB (ref 65–99)
Potassium: 3.4 mmol/L — ABNORMAL LOW (ref 3.5–5.1)
Sodium: 141 mmol/L (ref 135–145)

## 2016-04-09 LAB — TROPONIN I: Troponin I: <0.03 ng/mL (ref <0.03)

## 2016-04-09 LAB — CBC
HCT: 42 % (ref 40.0–52.0)
HEMOGLOBIN: 14.5 g/dL (ref 13.0–18.0)
MCH: 31.1 pg (ref 26.0–34.0)
MCHC: 34.5 g/dL (ref 32.0–36.0)
MCV: 90.1 fL (ref 80.0–100.0)
PLATELETS: 177 10*3/uL (ref 150–440)
RBC: 4.66 MIL/uL (ref 4.40–5.90)
RDW: 13.4 % (ref 11.5–14.5)
WBC: 6.2 10*3/uL (ref 3.8–10.6)

## 2016-04-09 MED ORDER — ACETAMINOPHEN 500 MG PO TABS
1000.0000 mg | ORAL_TABLET | Freq: Once | ORAL | Status: AC
  Administered 2016-04-09: 1000 mg via ORAL
  Filled 2016-04-09: qty 2

## 2016-04-09 MED ORDER — IOPAMIDOL (ISOVUE-370) INJECTION 76%
75.0000 mL | Freq: Once | INTRAVENOUS | Status: AC | PRN
  Administered 2016-04-09: 75 mL via INTRAVENOUS

## 2016-04-09 MED ORDER — SODIUM CHLORIDE 0.9 % IV BOLUS (SEPSIS)
1000.0000 mL | Freq: Once | INTRAVENOUS | Status: AC
  Administered 2016-04-09: 1000 mL via INTRAVENOUS

## 2016-04-09 NOTE — Discharge Instructions (Signed)
Please make an appointment with the GI doctors to follow up the abnormal finding in your esophagus that was seen on your CT scan.    Please make an appointment with the cardiologists for evaluation and stress test.  Return to the emergency department for severe pain, palpitations, lightheadedness or fainting, fever, or any other symptoms concerning to you.  Stop smoking.

## 2016-04-09 NOTE — ED Provider Notes (Signed)
[facility] Emergency Department Provider Note  ____________________________________________  Time seen: Approximately 4:38 PM  I have reviewed the triage vital signs and the nursing notes.   HISTORY  Chief Complaint Chest Pain    HPI Guy Rodriguez is a 35 y.o. male with HTN, ongoing tobacco abuse, no family history positive for CAD and DVT/PE, presenting with pleuritic chest pain, and a bump in the left medial thigh. The patient reports that for the past 2 days, he has had a constant sharp chest pain just left of the sternum that is worse with deep breaths. It is not associated with palpitations, lightheadedness, diaphoresis, nausea or vomiting or shortness of breath. He also has noticed a bump that is not red, fluctuant or draining in the left medial proximal thigh.  SH: Positive tobacco. Denies cocaine. The patient has recently spent 30 days in jail. FH: Positive DVT and PE as well as early CAD and stroke in his father.   Past Medical History:  Diagnosis Date  . ETOH abuse   . History of participation in smoking cessation counseling   . Hypertension     Patient Active Problem List   Diagnosis Date Noted  . Substance induced mood disorder (HCC) 02/14/2016  . Alcohol abuse 02/14/2016  . Cocaine abuse 02/14/2016  . 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 APPENDECTOMY    . clavicale surgery Left     Current Outpatient Rx  . Order #: [id number] Class: Historical Med  . Order #: [id number] Class: Print  . Order #: [id number] Class: Historical Med  . Order #: [id number] Class: Historical Med  . Order #: [id number] Class: Print  . Order #: [id number] Class: Historical Med  . Order #: [id number] Class: Print  . Order #: [id number] Class: Print  . Order #: [id number] Class: Historical Med  . Order #: [id number] Class: Historical Med    Allergies Review of patient's allergies indicates no known  allergies.  No family history on file.  Social History Social History  Substance Use Topics  . Smoking status: Current Every Day Smoker    Packs/day: 0.50    Types: Cigarettes  . Smokeless tobacco: Never Used  . Alcohol use Yes    Review of Systems Constitutional: No fever/chills. No lightheadedness or syncope. Eyes: No visual changes. ENT: No sore throat. No congestion or rhinorrhea. Cardiovascular: Positive pleuritic chest pain. Denies palpitations. Respiratory: Denies shortness of breath.  No cough. Gastrointestinal: No abdominal pain.  No nausea, no vomiting.  No diarrhea.  No constipation. Genitourinary: Negative for dysuria. Musculoskeletal: Negative for back pain. Positive "bump" in the proximal medial left thigh. No lower extremity swelling, calf pain. Skin: Negative for rash. Neurological: Negative for headaches. No focal numbness, tingling or weakness.   10-point ROS otherwise negative.  ____________________________________________   PHYSICAL EXAM:  VITAL SIGNS: ED Triage Vitals  Enc Vitals Group     BP 04/09/16 1406 128/72     Pulse Rate 04/09/16 1406 70     Resp 04/09/16 1406 18     Temp 04/09/16 1406 98 F (36.7 C)     Temp Source 04/09/16 1406 Oral     SpO2 04/09/16 1406 97 %     Weight 04/09/16 1406 200 lb (90.7 kg)     Height 04/09/16 1406 6\' 1"  (1.854 m)     Head Circumference --      Peak Flow --      Pain Score 04/09/16 1411 5     Pain Loc --  Pain Edu? --      Excl. in GC? --     Constitutional: Alert and oriented. Well appearing and in no acute distress. Answers questions appropriately. Eyes: Conjunctivae are normal.  EOMI. No scleral icterus. Head: Atraumatic. Nose: No congestion/rhinnorhea. Mouth/Throat: Mucous membranes are moist.  Neck: No stridor.  Supple.  No JVD. Cardiovascular: Normal rate, regular rhythm. No murmurs, rubs or gallops.  Respiratory: Normal respiratory effort.  No accessory muscle use or retractions. Lungs  CTAB.  No wheezes, rales or ronchi. Gastrointestinal: Soft, nontender and nondistended.  No guarding or rebound.  No peritoneal signs. Musculoskeletal: No LE edema. No ttp in the calves or palpable cords.  Negative Homan's sign. 2 inches below the left inguinal crease on the medial aspect of the proximal left thigh, the patient has a 1 x 2 cm palpable mass that is not fluctuant, and has no overlying erythema or drainage, and is nontender but does have some bluish discoloration. Neurologic:  A&Ox3.  Speech is clear.  Face and smile are symmetric.  EOMI.  Moves all extremities well. Skin:  Skin is warm, dry and intact. No rash noted. Psychiatric: Mood and affect are normal. Speech and behavior are normal.  Normal judgement.  ____________________________________________   LABS (all labs ordered are listed, but only abnormal results are displayed)  Labs Reviewed  BASIC METABOLIC PANEL - Abnormal; Notable for the following:       Result Value   Potassium 3.4 (*)    Glucose, Bld 125 (*)    BUN 21 (*)    Calcium 8.4 (*)    Anion gap 4 (*)    All other components within normal limits  CBC  TROPONIN I  TROPONIN I   ____________________________________________  EKG  ED ECG REPORT I, Rockne MenghiniNorman, Anne-Caroline, the attending physician, personally viewed and interpreted this ECG.   Date: 04/09/2016  EKG Time: 1406  Rate: 70  Rhythm: normal sinus rhythm  Axis: normal  Intervals:none  ST&T Change: Nonspecific T-wave inversion in V1. No ST elevation. No evidence of right heart strain.  ____________________________________________  RADIOLOGY  Dg Chest 2 View  Result Date: 04/09/2016 CLINICAL DATA:  Shortness of breath, chest pain EXAM: CHEST  2 VIEW COMPARISON:  10/12/2015 FINDINGS: The heart size and mediastinal contours are within normal limits. Both lungs are clear. The visualized skeletal structures are unremarkable. IMPRESSION: No active cardiopulmonary disease. Electronically Signed    By: Elige KoHetal  Patel   On: 04/09/2016 15:26   Ct Angio Chest Pe W And/or Wo Contrast  Result Date: 04/09/2016 CLINICAL DATA:  Chest pain for 1 day EXAM: CT ANGIOGRAPHY CHEST WITH CONTRAST TECHNIQUE: Multidetector CT imaging of the chest was performed using the standard protocol during bolus administration of intravenous contrast. Multiplanar CT image reconstructions and MIPs were obtained to evaluate the vascular anatomy. CONTRAST:  75 cc Isovue 370 COMPARISON:  None. FINDINGS: Cardiovascular: There are no filling defects in the pulmonary arterial tree to suggest acute pulmonary thromboembolism. No obvious aortic dissection or aneurysm. Mediastinum/Nodes: Small mediastinal nodes are noted. There is a 13 mm short axis diameter paraesophageal node on image 75. No pericardial effusion. There is significant wall thickening involving the distal third of the esophagus. Lungs/Pleura: No pneumothorax. No pleural effusion. Minimal dependent atelectasis in the lungs. Upper Abdomen: Unremarkable upper abdominal visualized organs. Gallbladder is decompressed. Musculoskeletal: No evidence of acute rib fracture. No vertebral compression deformity. Review of the MIP images confirms the above findings. IMPRESSION: No evidence of acute pulmonary thromboembolism There is  wall thickening of the distal 30 the esophagus. There is also abnormal adenopathy adjacent to the esophagus. Malignancy or an inflammatory process are not excluded. Consider barium esophagram and endoscopic examination. Electronically Signed   By: Jolaine Click M.D.   On: 04/09/2016 17:43   US Venous Img Lower Unilateral Left  Result Date: 04/09/2016 CLINICAL DATA:  Left leg pain.  Chest pain for 1 year. EXAM: LEFT LOWER EXTREMITY VENOUS DOPPLER ULTRASOUND TECHNIQUE: Gray-scale sonography with graded compression, as well as color Doppler and duplex ultrasound, were performed to evaluate the deep venous system from the level of the common femoral vein through the  popliteal and proximal calf veins. Spectral Doppler was utilized to evaluate flow at rest and with distal augmentation maneuvers. COMPARISON:  None. FINDINGS: Right common femoral vein is patent without thrombus. Normal compressibility, augmentation and color Doppler flow in the left common femoral vein, left femoral vein and left popliteal vein. The left saphenofemoral junction is patent. Left profunda femoral vein is patent without thrombus. Visualized left deep calf veins are patent without thrombus. There is a lobulated structure in the medial left upper thigh with minimal blood flow. IMPRESSION: Negative for deep venous thrombosis in left lower extremity. Lobulated structure in the superficial medial left upper thigh. This could represent a thrombosed varicose vein and superficial venous thrombosis. No other enlarged superficial venous structures are identified. Recommend clinical correlation in this area. Electronically Signed   By: Richarda Overlie M.D.   On: 04/09/2016 18:15    ____________________________________________   PROCEDURES  Procedure(s) performed: None  Procedures  Critical Care performed: No ____________________________________________   INITIAL IMPRESSION / ASSESSMENT AND PLAN / ED COURSE  Pertinent labs & imaging results that were available during my care of the patient were reviewed by me and considered in my medical decision making (see chart for details).  35 y.o. male with a history of tobacco abuse, and family history of early CAD and DVT/PE presenting with a pleuritic chest pain for the last 2 days and a bump on his leg. Overall, the patient is well-appearing with stable vital signs and has no acute cardiopulmonary findings on my examination. The bump on his leg is likely a superficial clot or thrombophlebitis although this is less likely given that he has no pain or erythema. We'll get a ultrasound to rule out DVT, and a CT angiogram to evaluate for PE. ACS or MI would be  very unlikely but will get 2 troponins and if it is negative consider an outpatient risk stratification studies as a stress test.  ----------------------------------------- 6:53 PM on 04/09/2016 -----------------------------------------  The patient's EKG does not show any ischemic changes and his troponin is negative x 2.  I have recommended that he follow-up with cardiology for an outpatient stress test. In addition, his CT angiogram does not show any evidence of PE, but there is some distal esophageal thickening that is concerning for possible underlying malignancy. I have discussed these findings with the patient, and he understands that he will need follow-up with GI for further evaluation.  I have spoken with Dr. Nedra Hai who will let Dr. Mechele Collin patient, and I will give the patient Dr. Earnest Conroy information as well.  At this time, the patient is stable for discharge. He understands return precautions as well as follow-up instructions.  ____________________________________________  FINAL CLINICAL IMPRESSION(S) / ED DIAGNOSES  Final diagnoses:  Chest pain, unspecified type  Acute superficial venous thrombosis of lower extremity, left  Esophageal thickening  Needs smoking cessation education  Clinical Course      NEW MEDICATIONS STARTED DURING THIS VISIT:  New Prescriptions   No medications on file      Rockne Menghini, MD 04/09/16 1857

## 2016-04-09 NOTE — ED Triage Notes (Signed)
Arrives via [facility].  C/O midsternal stabbing chest pain that started today.  Also reports left sided chest and back pain with respiration x 2-3 days.  Patient states he recently had a left leg cellulitis that wast treated with antibiotics and he was recently in jail for 30 days and has noticed a "knot in varicose" vein on left leg.

## 2016-12-04 ENCOUNTER — Emergency Department
Admission: EM | Admit: 2016-12-04 | Discharge: 2016-12-04 | Disposition: A | Discharge status: Home / Self Care | Payer: Medicaid Other | Attending: Emergency Medicine | Admitting: Emergency Medicine

## 2016-12-04 ENCOUNTER — Emergency Department: Payer: Medicaid Other

## 2016-12-04 ENCOUNTER — Encounter: Payer: Self-pay | Admitting: Emergency Medicine

## 2016-12-04 DIAGNOSIS — F1410 Cocaine abuse, uncomplicated: Secondary | ICD-10-CM | POA: Insufficient documentation

## 2016-12-04 DIAGNOSIS — I10 Essential (primary) hypertension: Secondary | ICD-10-CM | POA: Insufficient documentation

## 2016-12-04 DIAGNOSIS — Z7982 Long term (current) use of aspirin: Secondary | ICD-10-CM | POA: Insufficient documentation

## 2016-12-04 DIAGNOSIS — R079 Chest pain, unspecified: Secondary | ICD-10-CM | POA: Diagnosis present

## 2016-12-04 DIAGNOSIS — R0602 Shortness of breath: Secondary | ICD-10-CM | POA: Diagnosis not present

## 2016-12-04 DIAGNOSIS — Z79899 Other long term (current) drug therapy: Secondary | ICD-10-CM | POA: Insufficient documentation

## 2016-12-04 DIAGNOSIS — F17210 Nicotine dependence, cigarettes, uncomplicated: Secondary | ICD-10-CM | POA: Insufficient documentation

## 2016-12-04 DIAGNOSIS — R61 Generalized hyperhidrosis: Secondary | ICD-10-CM | POA: Diagnosis not present

## 2016-12-04 HISTORY — DX: Cocaine abuse, uncomplicated: F14.10

## 2016-12-04 LAB — BASIC METABOLIC PANEL
ANION GAP: 8 (ref 5–15)
BUN: 16 mg/dL (ref 6–20)
CALCIUM: 8.8 mg/dL — AB (ref 8.9–10.3)
CO2: 26 mmol/L (ref 22–32)
Chloride: 106 mmol/L (ref 101–111)
Creatinine, Ser: 1.11 mg/dL (ref 0.61–1.24)
Glucose, Bld: 99 mg/dL (ref 65–99)
Potassium: 3.9 mmol/L (ref 3.5–5.1)
SODIUM: 140 mmol/L (ref 135–145)

## 2016-12-04 LAB — CBC
HCT: 49 % (ref 40.0–52.0)
HEMOGLOBIN: 16.6 g/dL (ref 13.0–18.0)
MCH: 31.2 pg (ref 26.0–34.0)
MCHC: 33.9 g/dL (ref 32.0–36.0)
MCV: 92.2 fL (ref 80.0–100.0)
Platelets: 274 10*3/uL (ref 150–440)
RBC: 5.31 MIL/uL (ref 4.40–5.90)
RDW: 13.7 % (ref 11.5–14.5)
WBC: 8 10*3/uL (ref 3.8–10.6)

## 2016-12-04 LAB — TROPONIN I

## 2016-12-04 MED ORDER — LORAZEPAM 2 MG/ML IJ SOLN
1.0000 mg | Freq: Once | INTRAMUSCULAR | Status: AC
  Administered 2016-12-04: 1 mg via INTRAVENOUS
  Filled 2016-12-04: qty 1

## 2016-12-04 MED ORDER — ASPIRIN 81 MG PO CHEW
324.0000 mg | CHEWABLE_TABLET | Freq: Once | ORAL | Status: AC
  Administered 2016-12-04: 324 mg via ORAL
  Filled 2016-12-04: qty 4

## 2016-12-04 MED ORDER — MORPHINE SULFATE (PF) 4 MG/ML IV SOLN
4.0000 mg | Freq: Once | INTRAVENOUS | Status: AC
  Administered 2016-12-04: 4 mg via INTRAVENOUS
  Filled 2016-12-04: qty 1

## 2016-12-04 NOTE — BH Assessment (Signed)
This clinician contacted [facility] @ [phone] and requested a call back to [phone]. Request to refer patient for services. Will fax labs and Facesheet.

## 2016-12-04 NOTE — ED Triage Notes (Signed)
Pt c/o severe pain to left shoulder/neck and down left arm.  Started today at 1100 and has been getting worse.  Also has had SHOB and sweating today with pain.  Appears to be in pain. Is a cocaine user

## 2016-12-04 NOTE — ED Provider Notes (Signed)
[facility] Emergency Department Provider Note  ____________________________________________   First MD Initiated Contact with Patient 12/04/16 1638     (approximate)  I have reviewed the triage vital signs and the nursing notes.   HISTORY  Chief Complaint Chest Pain    HPI Guy Rodriguez is a 36 y.o. male who reports a history of daily cocaine abuse by smoking and "varicose veins "on gabapentin as well as a history of alcohol abuse but he denies currently drinking.  He presents for evaluation of acute onset left sided chest pain with pain radiating to his left neck and shoulder.  This started at about 11:00 this morning, about 5 hours prior to arrival, and has been gradually getting worse over time.  It is been accompanied with some mild shortness of breath and sweating.  The patient reports that it is severe and nothing makes it better nor worse.  He admits to smoking crack last night between 7 and and 8:00 PM but reports that he did not smoke any this morning.  He has not had chest pain before like this after smoking crack.  He denies any personal history of heart problems, reports that he does sometimes have high blood pressure but is on no medication.  He is a cigarette smoker as well.  He is generally otherwise healthy.   Past Medical History:  Diagnosis Date  . Cocaine abuse   . ETOH abuse   . History of participation in smoking cessation counseling   . Hypertension     Patient Active Problem List   Diagnosis Date Noted  . Substance induced mood disorder (HCC) 02/14/2016  . Alcohol abuse 02/14/2016  . Cocaine abuse 02/14/2016  . 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 APPENDECTOMY    . clavicale surgery Left     Prior to Admission medications   Medication Sig Start Date End Date Taking? Authorizing Provider  aspirin EC 81 MG tablet Take 81 mg by mouth daily.    [provider]  chlordiazePOXIDE (LIBRIUM) 10 MG capsule Take 1 capsule (10 mg total) by mouth 3 (three) times daily as needed for anxiety. Patient not taking: Reported on 02/14/2016 02/13/16   Sharman Cheek, MD  escitalopram (LEXAPRO) 10 MG tablet Take 10 mg by mouth daily.    [provider]  gabapentin (NEURONTIN) 600 MG tablet Take by mouth 3 (three) times daily.     [provider]  ibuprofen (ADVIL,MOTRIN) 800 MG tablet Take 1 tablet (800 mg total) by mouth every 8 (eight) hours as needed. 03/06/16   Rockne Menghini, MD  lisinopril (PRINIVIL,ZESTRIL) 10 MG tablet Take 10 mg by mouth daily.    [provider]  methocarbamol (ROBAXIN) 500 MG tablet Take 1 tablet (500 mg total) by mouth every 6 (six) hours as needed for muscle spasms. Patient not taking: Reported on 02/14/2016 01/22/16   Evangeline Dakin, PA-C  mirtazapine (REMERON) 15 MG tablet Take 1 tablet (15 mg total) by mouth at bedtime. 02/14/16   Clapacs, Jackquline Denmark, MD  ranitidine (ZANTAC) 300 MG tablet Take 150 mg by mouth 2 (two) times daily.     [provider]  varenicline (CHANTIX PAK) 0.5 MG X 11 & 1 MG X 42 tablet Take by mouth 2 (two) times daily. Take one 0.5 mg tablet by mouth once daily for 3 days, then increase to one 0.5 mg tablet twice daily for 4 days, then  increase to one 1 mg tablet twice daily.    [provider]    Allergies Patient has no known allergies.  History reviewed. No pertinent family history.  Social History Social History  Substance Use Topics  . Smoking status: Current Every Day Smoker    Packs/day: 0.50    Types: Cigarettes  . Smokeless tobacco: Never Used  . Alcohol use Yes    Review of Systems Constitutional: No fever/chills Eyes: No visual changes. ENT: No sore throat. Cardiovascular: +chest pain, acute onset and severe and radiating to left shoulder and arm Respiratory: Mild shortness of breath accompanying the chest pain Gastrointestinal: No  abdominal pain.  No nausea, no vomiting.  No diarrhea.  No constipation. Genitourinary: Negative for dysuria. Musculoskeletal: Negative for neck pain.  Negative for back pain. Integumentary: Negative for rash. Neurological: Negative for headaches, focal weakness or numbness.   ____________________________________________   PHYSICAL EXAM:  VITAL SIGNS: ED Triage Vitals [12/04/16 1616]  Enc Vitals Group     BP (!) 147/99     Pulse Rate (!) 10     Resp (!) 22     Temp 98.5 F (36.9 C)     Temp Source Oral     SpO2 98 %     Weight 81.2 kg (179 lb)     Height 1.854 m (6\' 1" )     Head Circumference      Peak Flow      Pain Score 8     Pain Loc      Pain Edu?      Excl. in GC?     Constitutional: Alert and oriented.  Generally well-appearing but does appear to be in pain and he is holding the left side of his chest and arm. Eyes: Conjunctivae are normal.  Head: Atraumatic. Nose: No congestion/rhinnorhea. Mouth/Throat: Mucous membranes are moist. Neck: No stridor.  No meningeal signs.   Cardiovascular: Normal rate, regular rhythm. Good peripheral circulation. Grossly normal heart sounds. Respiratory: Normal respiratory effort.  No retractions. Lungs CTAB. Gastrointestinal: Soft and nontender. No distention.  Musculoskeletal: No lower extremity tenderness nor edema. No gross deformities of extremities. Neurologic:  Normal speech and language. No gross focal neurologic deficits are appreciated.  Skin:  Skin is warm, dry and intact. No rash noted. Psychiatric: Mood and affect are normal. Speech and behavior are normal.  ____________________________________________   LABS (all labs ordered are listed, but only abnormal results are displayed)  Labs Reviewed  BASIC METABOLIC PANEL - Abnormal; Notable for the following:       Result Value   Calcium 8.8 (*)    All other components within normal limits  CBC  TROPONIN I  TROPONIN I    ____________________________________________  EKG  ED ECG REPORT I, Kamarii Buren, the attending physician, personally viewed and interpreted this ECG.  Date: 12/04/2016 EKG Time: 16:14 Rate: 96 Rhythm: Borderline tachycardia QRS Axis: normal Intervals: normal ST/T Wave abnormalities: normal Conduction Disturbances: none Narrative Interpretation: unremarkable with no evidence of acute ischemia  ____________________________________________  RADIOLOGY   Dg Chest 2 View  Result Date: 12/04/2016 CLINICAL DATA:  Chest pain EXAM: CHEST  2 VIEW COMPARISON:  04/09/2016 chest radiograph. FINDINGS: Stable cardiomediastinal silhouette with normal heart size. No pneumothorax. No pleural effusion. Lungs appear clear, with no acute consolidative airspace disease and no pulmonary edema. IMPRESSION: No active cardiopulmonary disease. Electronically Signed   By: Delbert PhenixJason A Poff M.D.   On: 12/04/2016 16:48    ____________________________________________   PROCEDURES  Critical  Care performed: No   Procedure(s) performed:   Procedures   ____________________________________________   INITIAL IMPRESSION / ASSESSMENT AND PLAN / ED COURSE  Pertinent labs & imaging results that were available during my care of the patient were reviewed by me and considered in my medical decision making (see chart for details).  The patient is having chest pain currently and it is most likely is a spasm in the setting of cocaine use.  Labs are pending.  Given that benzodiazepines may be helpful in suppressing the vasospasm, I will give him a milligram of Ativan and reassess and will provide morphine if needed.  I am giving him a full dose aspirin as well.  We had a discussion about his decisions and he is tearful and remorseful and expressed a desire to quit smoking crack.  I will place him in the chest pain observation for a repeat troponin to see if we can control his symptoms.  He has no suicidal ideation  or homicidal ideation at this time and does not meet criteria for involuntary commitment nor inpatient psychiatric treatment but we will assist him in outpatient resources to help him quit abusing drugs.  ----------------------------------------- 5:14 PM on 12/04/2016 -----------------------------------------  OBSERVATION CARE: This patient is being placed under observation care for the following reasons: Chest pain with repeat testing to rule out ischemia    Clinical Course as of Dec 05 2106  Wed Dec 04, 2016  1741 Patient reports pain is down to a 3 or 4 after the Ativan.  Will continue to monitor.  First troponin negative, will schedule a redraw  [CF]  1822 The patient's chest pain is back up to a 6 out of 10.  I will give him 4 morphine and to see if it helps we will put him on 2 L of oxygen by nasal cannula; if he is having coronary artery vasospasm as this may help alleviate some of the discomfort.  [CF]  1910 The patient is now sleeping comfortably with his left arm up above his head and in no acute distress, vital signs stable.  [CF]  [id number] Patient is feeling better with some persistent pain but describes it as mild and wants to eat a meal which we will provide.  Troponin will be redrawn in about 25 minutes.  [CF]  2102 Patient's pain is mild.  Second troponin was negative.  I informed him about the results and ask him if he would like to stay in order to be placed at [facility], but he prefers to go home at this time and follow up tomorrow with [facility].  He is comfortable with the level of discomfort he is at and he feels "much better" than he did when he got here.  I suggested several times that he stay until he feels completely better or until we can place him at [facility] but he says that his church and got him a hotel room for tonight and he has a friend with whom he can stay so he will not be tempted to go back and use drugs.  I specifically ask and he denied any suicidal or homicidal ideation and he  does intend to go to [facility] tomorrow.  Our [facility] counselor has touch base with him and faxed over his records and will let them know that he may be showing up tomorrow.   I gave my usual and customary return precautions.     [CF]  2106 END OF OBSERVATION STATUS: After an appropriate period  of observation, this patient is being discharged due to the following reason(s):  negative second troponin, hemodynamically stable, improved level of discomfort, wants to go home    [CF]    Clinical Course User Index [CF] Loleta Rose, MD    ____________________________________________  FINAL CLINICAL IMPRESSION(S) / ED DIAGNOSES  Final diagnoses:  Chest pain, unspecified type  Cocaine abuse     MEDICATIONS GIVEN DURING THIS VISIT:  Medications  aspirin chewable tablet 324 mg (324 mg Oral Given 12/04/16 1719)  LORazepam (ATIVAN) injection 1 mg (1 mg Intravenous Given 12/04/16 1719)  morphine 4 MG/ML injection 4 mg (4 mg Intravenous Given 12/04/16 1829)     NEW OUTPATIENT MEDICATIONS STARTED DURING THIS VISIT:  New Prescriptions   No medications on file    Modified Medications   No medications on file    Discontinued Medications   No medications on file     Note:  This document was prepared using Dragon voice recognition software and may include unintentional dictation errors.   Loleta Rose, MD 12/04/16 2108

## 2016-12-04 NOTE — Discharge Instructions (Signed)
You have been seen in the Emergency Department (ED) today for chest pain.  As we have discussed today?s test results are normal, but you may require further testing.  It is also very important that you avoid using any cocaine or other illegal drugs or other stimulants that may stress your heart.  Please follow up with the recommended doctor as instructed above in these documents regarding today?s emergent visit and your recent symptoms to discuss further management.  Continue to take your regular medications. If you are not doing so already, please also take a daily baby aspirin (81 mg), at least until you follow up with your doctor.  Return to the Emergency Department (ED) if you experience any further chest pain/pressure/tightness, difficulty breathing, or sudden sweating, or other symptoms that concern you.

## 2016-12-04 NOTE — ED Notes (Addendum)
Pt c/o CP. RN, Butch and Dr. York CeriseForbach have been notified.

## 2016-12-05 ENCOUNTER — Other Ambulatory Visit: Payer: Medicaid Other

## 2016-12-26 ENCOUNTER — Ambulatory Visit (INDEPENDENT_AMBULATORY_CARE_PROVIDER_SITE_OTHER): Payer: Medicaid Other | Admitting: Vascular Surgery

## 2016-12-26 DIAGNOSIS — I83819 Varicose veins of unspecified lower extremities with pain: Secondary | ICD-10-CM | POA: Insufficient documentation

## 2016-12-26 DIAGNOSIS — K219 Gastro-esophageal reflux disease without esophagitis: Secondary | ICD-10-CM | POA: Insufficient documentation

## 2016-12-26 DIAGNOSIS — I872 Venous insufficiency (chronic) (peripheral): Secondary | ICD-10-CM | POA: Insufficient documentation

## 2016-12-26 DIAGNOSIS — I10 Essential (primary) hypertension: Secondary | ICD-10-CM | POA: Insufficient documentation

## 2016-12-26 NOTE — Progress Notes (Deleted)
MRN : [id number]  Guy Rodriguez is a 36 y.o. (12/22/1980) male who presents with chief complaint of No chief complaint on file. Marland Kitchen.  History of Present Illness: The patient is seen for follow up evaluation of symptomatic varicose veins. He was initially seen in 2017.  The patient relates burning and stinging which worsened steadily throughout the course of the day, particularly with standing. The patient also notes an aching and throbbing pain over the varicosities, particularly with prolonged dependent positions. The symptoms are significantly improved with elevation. The patient also notes that during hot weather the symptoms are greatly intensified. The patient states the pain from the varicose veins interferes with work, daily exercise, shopping and household maintenance. At this point, the symptoms are persistent and severe enough that they're having a negative impact on lifestyle and are interfering with daily activities.  There is no history of DVT, PE or superficial thrombophlebitis. There is no history of ulceration or hemorrhage. The patient denies a significant family history of varicose veins.  The patient has not worn graduated compression in the past. At the present time the patient has not been using over-the-counter analgesics. There is no history of prior surgical intervention or sclerotherapy.  No outpatient prescriptions have been marked as taking for the 12/26/16 encounter (Appointment) with Gilda CreaseSchnier, Latina CraverGregory G, MD.    Past Medical History:  Diagnosis Date  . Cocaine abuse   . ETOH abuse   . History of participation in smoking cessation counseling   . Hypertension     Past Surgical History:  Procedure Laterality Date  . APPENDECTOMY    . clavicale surgery Left     Social History Social History  Substance Use Topics  . Smoking status: Current Every Day Smoker    Packs/day: 0.50    Types: Cigarettes  . Smokeless tobacco: Never Used  . Alcohol use Yes     Family History No family history on file.  No Known Allergies   REVIEW OF SYSTEMS (Negative unless checked)  Constitutional: [] Weight loss  [] Fever  [] Chills Cardiac: [] Chest pain   [] Chest pressure   [] Palpitations   [] Shortness of breath when laying flat   [] Shortness of breath with exertion. Vascular:  [] Pain in legs with walking   [] Pain in legs at rest  [] History of DVT   [] Phlebitis   [] Swelling in legs   [] Varicose veins   [] Non-healing ulcers Pulmonary:   [] Uses home oxygen   [] Productive cough   [] Hemoptysis   [] Wheeze  [] COPD   [] Asthma Neurologic:  [] Dizziness   [] Seizures   [] History of stroke   [] History of TIA  [] Aphasia   [] Vissual changes   [] Weakness or numbness in arm   [] Weakness or numbness in leg Musculoskeletal:   [] Joint swelling   [] Joint pain   [] Low back pain Hematologic:  [] Easy bruising  [] Easy bleeding   [] Hypercoagulable state   [] Anemic Gastrointestinal:  [] Diarrhea   [] Vomiting  [] Gastroesophageal reflux/heartburn   [] Difficulty swallowing. Genitourinary:  [] Chronic kidney disease   [] Difficult urination  [] Frequent urination   [] Blood in urine Skin:  [] Rashes   [] Ulcers  Psychological:  [] History of anxiety   []  History of major depression.  Physical Examination  There were no vitals filed for this visit. There is no height or weight on file to calculate BMI. Gen: WD/WN, NAD Head: [place]/AT, No temporalis wasting.  Ear/Nose/Throat: Hearing grossly intact, nares w/o erythema or drainage Eyes: PER, EOMI, sclera nonicteric.  Neck: Supple, no large masses.  Pulmonary:  Good air movement, no audible wheezing bilaterally, no use of accessory muscles.  Cardiac: RRR, no JVD Vascular: *** Vessel Right Left  Radial Palpable Palpable  Ulnar Palpable Palpable  Brachial Palpable Palpable  Carotid Palpable Palpable  Femoral Palpable Palpable  Popliteal Palpable Palpable  PT Palpable Palpable  DP Palpable Palpable  Gastrointestinal: Non-distended. No  guarding/no peritoneal signs.  Musculoskeletal: M/S 5/5 throughout.  No deformity or atrophy.  Neurologic: CN 2-12 intact. Symmetrical.  Speech is fluent. Motor exam as listed above. Psychiatric: Judgment intact, Mood & affect appropriate for pt's clinical situation. Dermatologic: No rashes or ulcers noted.  No changes consistent with cellulitis. Lymph : No lichenification or skin changes of chronic lymphedema.  CBC Lab Results  Component Value Date   WBC 8.0 12/04/2016   HGB 16.6 12/04/2016   HCT 49.0 12/04/2016   MCV 92.2 12/04/2016   PLT 274 12/04/2016    BMET    Component Value Date/Time   NA 140 12/04/2016 1618   NA 138 07/20/2014 1716   K 3.9 12/04/2016 1618   K 3.5 07/20/2014 1716   CL 106 12/04/2016 1618   CL 104 07/20/2014 1716   CO2 26 12/04/2016 1618   CO2 24 07/20/2014 1716   GLUCOSE 99 12/04/2016 1618   GLUCOSE 96 07/20/2014 1716   BUN 16 12/04/2016 1618   BUN 16 07/20/2014 1716   CREATININE 1.11 12/04/2016 1618   CREATININE 1.00 07/20/2014 1716   CALCIUM 8.8 (L) 12/04/2016 1618   CALCIUM 8.6 07/20/2014 1716   GFRNONAA >60 12/04/2016 1618   GFRNONAA >60 07/20/2014 1716   GFRAA >60 12/04/2016 1618   GFRAA >60 07/20/2014 1716   CrCl cannot be calculated (Patient's most recent lab result is older than the maximum 21 days allowed.).  COAG No results found for: INR, PROTIME  Radiology Dg Chest 2 View  Result Date: 12/04/2016 CLINICAL DATA:  Chest pain EXAM: CHEST  2 VIEW COMPARISON:  04/09/2016 chest radiograph. FINDINGS: Stable cardiomediastinal silhouette with normal heart size. No pneumothorax. No pleural effusion. Lungs appear clear, with no acute consolidative airspace disease and no pulmonary edema. IMPRESSION: No active cardiopulmonary disease. Electronically Signed   By: Delbert Phenix M.D.   On: 12/04/2016 16:48    Outside Studies/Documentation *** pages of outside documents were reviewed.  They showed ***.  Assessment/Plan There are no diagnoses  linked to this encounter.   Levora Dredge, MD  12/26/2016 12:28 PM

## 2016-12-30 ENCOUNTER — Encounter (INDEPENDENT_AMBULATORY_CARE_PROVIDER_SITE_OTHER): Payer: Self-pay | Admitting: Vascular Surgery

## 2016-12-30 ENCOUNTER — Ambulatory Visit (INDEPENDENT_AMBULATORY_CARE_PROVIDER_SITE_OTHER): Payer: Medicaid Other | Admitting: Vascular Surgery

## 2016-12-30 VITALS — BP 125/83 | HR 67 | Resp 17 | Wt 181.0 lb

## 2016-12-30 DIAGNOSIS — I872 Venous insufficiency (chronic) (peripheral): Secondary | ICD-10-CM | POA: Diagnosis not present

## 2016-12-30 DIAGNOSIS — K219 Gastro-esophageal reflux disease without esophagitis: Secondary | ICD-10-CM | POA: Diagnosis not present

## 2016-12-30 DIAGNOSIS — I83813 Varicose veins of bilateral lower extremities with pain: Secondary | ICD-10-CM | POA: Diagnosis not present

## 2016-12-30 DIAGNOSIS — I10 Essential (primary) hypertension: Secondary | ICD-10-CM | POA: Diagnosis not present

## 2016-12-30 NOTE — Progress Notes (Signed)
MRN : [id number]  Guy Rodriguez is a 36 y.o. (Jul 29, 1980) male who presents with chief complaint of  Chief Complaint  Patient presents with  . New Evaluation    Varicose Veins  .  History of Present Illness:   The patient returns for followup evaluation about one year after the initial visit. The patient continues to have pain in the lower extremities with dependency. The pain is lessened with elevation. Graduated compression stockings, Class I (20-30 mmHg), have been worn but the stockings do not eliminate the leg pain.  In fact his veins have nearly "doubled in size" since his last visit in spite of compression therapy. Over-the-counter analgesics do not improve the symptoms. The degree of discomfort continues to interfere with daily activities. The patient notes the pain in the legs is causing problems with daily exercise, at the workplace and even with household activities and maintenance such as standing in the kitchen preparing meals and doing dishes.   Venous ultrasound has not yet been done.  Current Meds  Medication Sig  . aspirin EC 81 MG tablet Take 81 mg by mouth daily.  Marland Kitchen gabapentin (NEURONTIN) 600 MG tablet Take by mouth 3 (three) times daily.   . ranitidine (ZANTAC) 300 MG tablet Take 150 mg by mouth 2 (two) times daily.     Past Medical History:  Diagnosis Date  . Cocaine abuse   . ETOH abuse   . History of participation in smoking cessation counseling   . Hypertension     Past Surgical History:  Procedure Laterality Date  . APPENDECTOMY    . clavicale surgery Left     Social History Social History  Substance Use Topics  . Smoking status: Current Every Day Smoker    Packs/day: 0.50    Types: Cigarettes  . Smokeless tobacco: Never Used  . Alcohol use Yes    Family History No family history on file.  No Known Allergies   REVIEW OF SYSTEMS (Negative unless checked)  Constitutional: [] Weight loss  [] Fever  [] Chills Cardiac: [] Chest pain    [] Chest pressure   [] Palpitations   [] Shortness of breath when laying flat   [] Shortness of breath with exertion. Vascular:  [] Pain in legs with walking   [x] Pain in legs with standing  [] History of DVT   [] Phlebitis   [x] Swelling in legs   [x] Varicose veins   [] Non-healing ulcers Pulmonary:   [] Uses home oxygen   [] Productive cough   [] Hemoptysis   [] Wheeze  [] COPD   [] Asthma Neurologic:  [] Dizziness   [] Seizures   [] History of stroke   [] History of TIA  [] Aphasia   [] Vissual changes   [] Weakness or numbness in arm   [] Weakness or numbness in leg Musculoskeletal:   [] Joint swelling   [] Joint pain   [] Low back pain Hematologic:  [] Easy bruising  [] Easy bleeding   [] Hypercoagulable state   [] Anemic Gastrointestinal:  [] Diarrhea   [] Vomiting  [] Gastroesophageal reflux/heartburn   [] Difficulty swallowing. Genitourinary:  [] Chronic kidney disease   [] Difficult urination  [] Frequent urination   [] Blood in urine Skin:  [] Rashes   [] Ulcers  Psychological:  [] History of anxiety   []  History of major depression.  Physical Examination  Vitals:   12/30/16 1124  BP: 125/83  Pulse: 67  Resp: 17  Weight: 181 lb (82.1 kg)   Body mass index is 23.88 kg/m. Gen: WD/WN, NAD Head: [place]/AT, No temporalis wasting.  Ear/Nose/Throat: Hearing grossly intact, nares w/o erythema or drainage Eyes: PER, EOMI, sclera nonicteric.  Neck: Supple, no large masses.   Pulmonary:  Good air movement, no audible wheezing bilaterally, no use of accessory muscles.  Cardiac: RRR, no JVD Vascular: Large varicosities present extensively greater than 20 mm bilaterally.  Mild venous stasis changes to the legs bilaterally.  1+ soft pitting edema Vessel Right Left  Radial Palpable Palpable  PT Palpable Palpable  DP Palpable Palpable  Gastrointestinal: Non-distended. No guarding/no peritoneal signs.  Musculoskeletal: M/S 5/5 throughout.  No deformity or atrophy.  Neurologic: CN 2-12 intact. Symmetrical.  Speech is fluent. Motor  exam as listed above. Psychiatric: Judgment intact, Mood & affect appropriate for pt's clinical situation. Dermatologic: No rashes or ulcers noted.  No changes consistent with cellulitis. Lymph : No lichenification or skin changes of chronic lymphedema.  CBC Lab Results  Component Value Date   WBC 8.0 12/04/2016   HGB 16.6 12/04/2016   HCT 49.0 12/04/2016   MCV 92.2 12/04/2016   PLT 274 12/04/2016    BMET    Component Value Date/Time   NA 140 12/04/2016 1618   NA 138 07/20/2014 1716   K 3.9 12/04/2016 1618   K 3.5 07/20/2014 1716   CL 106 12/04/2016 1618   CL 104 07/20/2014 1716   CO2 26 12/04/2016 1618   CO2 24 07/20/2014 1716   GLUCOSE 99 12/04/2016 1618   GLUCOSE 96 07/20/2014 1716   BUN 16 12/04/2016 1618   BUN 16 07/20/2014 1716   CREATININE 1.11 12/04/2016 1618   CREATININE 1.00 07/20/2014 1716   CALCIUM 8.8 (L) 12/04/2016 1618   CALCIUM 8.6 07/20/2014 1716   GFRNONAA >60 12/04/2016 1618   GFRNONAA >60 07/20/2014 1716   GFRAA >60 12/04/2016 1618   GFRAA >60 07/20/2014 1716   CrCl cannot be calculated (Patient's most recent lab result is older than the maximum 21 days allowed.).  COAG No results found for: INR, PROTIME  Radiology Dg Chest 2 View  Result Date: 12/04/2016 CLINICAL DATA:  Chest pain EXAM: CHEST  2 VIEW COMPARISON:  04/09/2016 chest radiograph. FINDINGS: Stable cardiomediastinal silhouette with normal heart size. No pneumothorax. No pleural effusion. Lungs appear clear, with no acute consolidative airspace disease and no pulmonary edema. IMPRESSION: No active cardiopulmonary disease. Electronically Signed   By: Delbert PhenixJason A Poff M.D.   On: 12/04/2016 16:48    Assessment/Plan 1. Varicose veins of both lower extremities with pain  Recommend:  The patient has very large symptomatic varicose veins that are painful and associated with swelling.  I have had a long discussion with the patient regarding  varicose veins and why they cause symptoms.  Patient  will continue wearing graduated compression stockings class 1 on a daily basis, beginning first thing in the morning and removing them in the evening.  He notes he has been utilizing compression since his last visit The patient is instructed specifically not to sleep in the stockings.    The patient  will also begin using over-the-counter analgesics such as Motrin 600 mg po TID to help control the symptoms.    In addition, behavioral modification including elevation during the day will be initiated.    Pending the results of these changes the  patient will be reevaluated in three months.   An  ultrasound of the venous system will be obtained.   Further plans will be based on the ultrasound results and whether conservative therapies are successful at eliminating the pain and swelling.   - VAS US LOWER EXTREMITY VENOUS REFLUX; Future  2. Chronic venous insufficiency See #  1  3. Essential hypertension Continue antihypertensive medications as already ordered, these medications have been reviewed and there are no changes at this time.   4. Gastroesophageal reflux disease without esophagitis Continue PPI as already ordered, these medications have been reviewed and there are no changes at this time.     Levora Dredge, MD  12/30/2016 9:31 PM

## 2017-02-03 NOTE — Progress Notes (Signed)
02/04/2017 9:31 AM   Guy Rodriguez 10/07/1980 [id number]  Referring provider: Cyndie MullSantayana, Gloria Patricia, DO [address] [place], [place] [id number]  Chief Complaint  Patient presents with  . VAS Consult    New patient    HPI: Guy Rodriguez is a 36 year old Caucasian male presents today requesting a vasectomy.  Patient has three children, one son and two daughters, who desires no further biological children.  Patient denies any history of chronic prostatitis, epididymitis, orchitis, or other genital pain.  Today, we discussed what the vas deferens is, where it is located, and its function. We reviewed the procedure for vasectomy, it's risks, benefits, alternatives, and likelihood of achieving his goals.   We discussed in detail the procedure, complications, and recovery as well as the need for clearance prior to unprotected intercourse. We discussed that vasectomy does not protect against sexually transmitted diseases. We discussed that this procedure does not result in immediate sterility and that they would need to use other forms of birth control until he has been cleared with a three month negative postvasectomy semen analyses.  I explained that the procedure is considered to be permanent and that attempts at reversal have varying degrees of success. These options include vasectomy reversal, sperm retrieval, and in vitro fertilization; these can be very expensive.   We discussed the chance of postvasectomy pain syndrome which occurs in less than 5% of patients. I explained to the patient that there is no treatment to resolve this chronic pain, and that if it developed I would not be able to help resolve the issue, but that surgery is generally not needed for correction.   I explained there have even been reports of systemic like illness associated with this chronic pain, and that there was no good cure. I explained that vasectomy it is not a 100% reliable form of birth  control, and the risk of pregnancy after vasectomy is approximately 1 in 2000 men who had a negative postvasectomy semen analysis or rare non-motile sperm.  I explained that repeat vasectomy was necessary in less than 1% of vasectomy procedures when employing the type of technique that is performed in the office. I explained that he should refrain from ejaculation for approximately one week following vasectomy. I explained that there are other options for birth control which are permanent and non-permanent; we discussed these.  I explained the rates of surgical complications, such as symptomatic hematoma or infection, are low (1-2%) and vary with the surgeon's experience and criteria used to diagnose the complication.   PMH: Past Medical History:  Diagnosis Date  . Cocaine abuse   . ETOH abuse   . GERD (gastroesophageal reflux disease)   . History of participation in smoking cessation counseling   . Hypertension     Surgical History: Past Surgical History:  Procedure Laterality Date  . APPENDECTOMY    . clavicale surgery Left     Home Medications:  Allergies as of 02/04/2017   No Known Allergies     Medication List       Accurate as of 02/04/17  9:31 AM. Always use your most recent med list.          diazepam 10 MG tablet Commonly known as:  VALIUM Take one tablet 30 minutes prior to the vasectomy   gabapentin 600 MG tablet Commonly known as:  NEURONTIN Take by mouth 3 (three) times daily.   ranitidine 300 MG tablet Commonly known as:  ZANTAC Take 150  mg by mouth 2 (two) times daily.       Allergies: No Known Allergies  Family History: Family History  Problem Relation Age of Onset  . Prostate cancer Neg Hx   . Kidney cancer Neg Hx     Social History:  reports that he has been smoking Cigarettes.  He has been smoking about 0.50 packs per day. He has never used smokeless tobacco. He reports that he does not drink alcohol or use drugs.  ROS: UROLOGY Frequent  Urination?: No Hard to postpone urination?: No Burning/pain with urination?: No Get up at night to urinate?: No Leakage of urine?: No Urine stream starts and stops?: No Trouble starting stream?: No Do you have to strain to urinate?: No Blood in urine?: No Urinary tract infection?: No Sexually transmitted disease?: No Injury to kidneys or bladder?: No Painful intercourse?: No Weak stream?: No Erection problems?: No Penile pain?: No  Gastrointestinal Nausea?: No Vomiting?: No Indigestion/heartburn?: No Diarrhea?: No Constipation?: No  Constitutional Fever: No Night sweats?: No Weight loss?: No Fatigue?: No  Skin Skin rash/lesions?: No Itching?: No  Eyes Blurred vision?: No Double vision?: No  Ears/Nose/Throat Sore throat?: No Sinus problems?: No  Hematologic/Lymphatic Swollen glands?: No Easy bruising?: No  Cardiovascular Leg swelling?: No Chest pain?: No  Respiratory Cough?: No Shortness of breath?: No  Endocrine Excessive thirst?: No  Musculoskeletal Back pain?: No Joint pain?: No  Neurological Headaches?: No Dizziness?: No  Psychologic Depression?: No Anxiety?: No  Physical Exam: BP (!) 144/97   Pulse 64   Ht 6\' 1"  (1.854 m)   Wt 180 lb (81.6 kg)   BMI 23.75 kg/m   Constitutional: Well nourished. Alert and oriented, No acute distress. HEENT: [place] AT, moist mucus membranes. Trachea midline, no masses. Cardiovascular: No clubbing, cyanosis, or edema. Respiratory: Normal respiratory effort, no increased work of breathing. GI: Abdomen is soft, non tender, non distended, no abdominal masses. Liver and spleen not palpable.  No hernias appreciated.  Stool sample for occult testing is not indicated.   GU: No CVA tenderness.  No bladder fullness or masses.  Patient with circumcised phallus.  Urethral meatus is patent.  No penile discharge. No penile lesions or rashes. Scrotum without lesions, cysts, rashes and/or edema.  Testicles are located  scrotally bilaterally. No masses are appreciated in the testicles. Left and right epididymis are normal. Rectal: Not examined.   Skin: No rashes, bruises or suspicious lesions. Lymph: No cervical or inguinal adenopathy. Neurologic: Grossly intact, no focal deficits, moving all 4 extremities. Psychiatric: Normal mood and affect.  Laboratory Data: Lab Results  Component Value Date   WBC 8.0 12/04/2016   HGB 16.6 12/04/2016   HCT 49.0 12/04/2016   MCV 92.2 12/04/2016   PLT 274 12/04/2016    Lab Results  Component Value Date   CREATININE 1.11 12/04/2016     Lab Results  Component Value Date   AST 22 02/14/2016   Lab Results  Component Value Date   ALT 21 02/14/2016    I have reviewed the labs.   Assessment & Plan:    1. Vasectomy consult:  Patient has read and signed the consent.  He is given the pre-op vasectomy instruction sheet.  He is prescribed Valium 10 mg and instructed to take it 30 minutes prior to his vasectomy appointment.  He is to have a driver.  I reemphasized to the patient that this is to be considered a permanent form of birth control, that he is to use an  alternative form of birth control until we receive the 3 months specimen and it is cleared of sperm and that this will not prevent STI's.  His questions are answered to his satisfaction and he understands the risks and is willing to proceed with the vasectomy.  He will schedule his vasectomy.    I spent 30 minutes in a face-to-face conversation concerning the vasectomy procedure and pre-and post op expectations.  Greater than 50% was spent in counseling & coordination of care with the patient.   Return for schedule vas.  These notes generated with voice recognition software. I apologize for typographical errors.  Michiel Cowboy, PA-C  [facility] [address] [place], [place] [address] [phone]

## 2017-02-04 ENCOUNTER — Encounter: Payer: Self-pay | Admitting: Urology

## 2017-02-04 ENCOUNTER — Telehealth: Payer: Self-pay | Admitting: Urology

## 2017-02-04 ENCOUNTER — Ambulatory Visit (INDEPENDENT_AMBULATORY_CARE_PROVIDER_SITE_OTHER): Payer: Medicaid Other | Admitting: Urology

## 2017-02-04 VITALS — BP 144/97 | HR 64 | Ht 73.0 in | Wt 180.0 lb

## 2017-02-04 DIAGNOSIS — Z3009 Encounter for other general counseling and advice on contraception: Secondary | ICD-10-CM | POA: Diagnosis not present

## 2017-02-04 MED ORDER — DIAZEPAM 10 MG PO TABS: ORAL_TABLET | ORAL | 0 refills | Status: DC

## 2017-02-04 NOTE — Telephone Encounter (Signed)
Would you sent my note to Dr. Saintclair HalstedGloria Patricia Santayana?

## 2017-02-05 NOTE — Telephone Encounter (Signed)
Office note faxed.

## 2017-02-11 ENCOUNTER — Encounter: Payer: Self-pay | Admitting: Urology

## 2017-02-11 ENCOUNTER — Ambulatory Visit (INDEPENDENT_AMBULATORY_CARE_PROVIDER_SITE_OTHER): Payer: Medicaid Other | Admitting: Urology

## 2017-02-11 ENCOUNTER — Other Ambulatory Visit: Payer: Self-pay | Admitting: Radiology

## 2017-02-11 VITALS — BP 131/76 | HR 62 | Ht 73.0 in | Wt 178.0 lb

## 2017-02-11 DIAGNOSIS — Z3009 Encounter for other general counseling and advice on contraception: Secondary | ICD-10-CM

## 2017-02-11 DIAGNOSIS — Z302 Encounter for sterilization: Secondary | ICD-10-CM

## 2017-02-11 MED ORDER — OXYCODONE-ACETAMINOPHEN 5-325 MG PO TABS: 1.0000 | ORAL_TABLET | ORAL | 0 refills | Status: DC | PRN

## 2017-02-11 NOTE — Progress Notes (Signed)
Patient presented for vasectomy today. However on exam his testicles are high riding and not appropriately located in the scrotum. They are palpable below the inguinal ring bilaterally. The vas is very difficult to palpate due to surrounding tissue and high riding testicle location. I do not think an office vasectomy would be feasible due to pain control issues and need for deeper dissection. He also may need a larger incision. I have discussed with the patient. We'll schedule for him to undergo his vasectomy under anesthesia.

## 2017-02-12 ENCOUNTER — Telehealth: Payer: Self-pay | Admitting: Radiology

## 2017-02-12 ENCOUNTER — Encounter: Payer: Self-pay | Admitting: Emergency Medicine

## 2017-02-12 ENCOUNTER — Emergency Department: Payer: Medicaid Other

## 2017-02-12 DIAGNOSIS — Z5321 Procedure and treatment not carried out due to patient leaving prior to being seen by health care provider: Secondary | ICD-10-CM | POA: Insufficient documentation

## 2017-02-12 DIAGNOSIS — R2243 Localized swelling, mass and lump, lower limb, bilateral: Secondary | ICD-10-CM | POA: Diagnosis not present

## 2017-02-12 DIAGNOSIS — G5773 Causalgia of bilateral lower limbs: Secondary | ICD-10-CM | POA: Diagnosis not present

## 2017-02-12 NOTE — ED Notes (Signed)
Pedal Pulses present in both right and left feet.

## 2017-02-12 NOTE — Telephone Encounter (Signed)
Pt scheduled for vasectomy with Dr Sherryl BartersBudzyn on 02/28/17. Made pt aware of surgery date, pre-admit testing phone interview & to call day prior to surgery for arrival time to [facility]. Questions answered. Pt voices understanding.

## 2017-02-12 NOTE — ED Triage Notes (Signed)
Pt ambulatory to triage with steady slow gait. Pt c/o lower bilateral leg pain and swelling. Pt has appointment on Aug. 13 at [facility] for issue but to ED today due to increase in pain and swelling. US ordered.

## 2017-02-13 ENCOUNTER — Emergency Department
Admission: EM | Admit: 2017-02-13 | Discharge: 2017-02-13 | Disposition: A | Discharge status: Home / Self Care | Payer: Medicaid Other | Attending: Emergency Medicine | Admitting: Emergency Medicine

## 2017-02-13 DIAGNOSIS — M79605 Pain in left leg: Secondary | ICD-10-CM

## 2017-02-13 DIAGNOSIS — R609 Edema, unspecified: Secondary | ICD-10-CM

## 2017-02-13 DIAGNOSIS — M79604 Pain in right leg: Secondary | ICD-10-CM

## 2017-02-13 NOTE — ED Notes (Signed)
No answer when called from lobby 

## 2017-02-13 NOTE — ED Notes (Addendum)
Noted u/s results; No answer when called from lobby; charge nurse notified; attempted to call numbers listed in chart but no answer

## 2017-02-14 ENCOUNTER — Telehealth: Payer: Self-pay | Admitting: Emergency Medicine

## 2017-02-14 NOTE — Telephone Encounter (Signed)
Called patient to inform of abnormal ultrasound doppler test. He had left before being seen.  They attempted to call him last night but were unable to contact.  I did talk to a person who will see him and they will have him call us back.

## 2017-02-17 ENCOUNTER — Encounter (INDEPENDENT_AMBULATORY_CARE_PROVIDER_SITE_OTHER): Payer: Self-pay | Admitting: Vascular Surgery

## 2017-02-17 ENCOUNTER — Ambulatory Visit (INDEPENDENT_AMBULATORY_CARE_PROVIDER_SITE_OTHER): Payer: Medicaid Other | Admitting: Vascular Surgery

## 2017-02-17 ENCOUNTER — Ambulatory Visit (INDEPENDENT_AMBULATORY_CARE_PROVIDER_SITE_OTHER): Payer: Medicaid Other

## 2017-02-17 VITALS — BP 149/107 | HR 67 | Resp 16 | Ht 73.0 in | Wt 182.4 lb

## 2017-02-17 DIAGNOSIS — I872 Venous insufficiency (chronic) (peripheral): Secondary | ICD-10-CM | POA: Diagnosis not present

## 2017-02-17 DIAGNOSIS — I10 Essential (primary) hypertension: Secondary | ICD-10-CM | POA: Diagnosis not present

## 2017-02-17 DIAGNOSIS — I83813 Varicose veins of bilateral lower extremities with pain: Secondary | ICD-10-CM | POA: Diagnosis not present

## 2017-02-17 DIAGNOSIS — K219 Gastro-esophageal reflux disease without esophagitis: Secondary | ICD-10-CM

## 2017-02-17 DIAGNOSIS — M79605 Pain in left leg: Secondary | ICD-10-CM

## 2017-02-17 DIAGNOSIS — M79604 Pain in right leg: Secondary | ICD-10-CM

## 2017-02-17 MED ORDER — PREGABALIN 300 MG PO CAPS: 300.0000 mg | ORAL_CAPSULE | Freq: Two times a day (BID) | ORAL | 5 refills | Status: DC

## 2017-02-19 DIAGNOSIS — M79606 Pain in leg, unspecified: Secondary | ICD-10-CM | POA: Insufficient documentation

## 2017-02-19 NOTE — Progress Notes (Signed)
MRN : [id number]  Guy Rodriguez is a 36 y.o. (06/07/1981) male who presents with chief complaint of  Chief Complaint  Patient presents with  . Varicose Veins    Pt stated Back of the legs, calf  pain/cramping for about 1 year  .  History of Present Illness: The patient returns for followup evaluation 3 months after the initial visit. The patient continues to have pain in the lower extremities with dependency. The pain is lessened with elevation. Graduated compression stockings, Class I (20-30 mmHg), have been worn but the stockings do not eliminate the leg pain. Over-the-counter analgesics do not improve the symptoms. The degree of discomfort continues to interfere with daily activities. The patient notes the pain in the legs is causing problems with daily exercise, at the workplace and even with household activities and maintenance such as standing in the kitchen preparing meals and doing dishes.   Venous ultrasound shows normal deep venous system, no evidence of acute or chronic DVT.  Superficial reflux is present in the Right small saphenous vein and left great saphenous vein  No outpatient prescriptions have been marked as taking for the 02/17/17 encounter (Office Visit) with Gilda CreaseSchnier, Latina CraverGregory G, MD.    Past Medical History:  Diagnosis Date  . Cocaine abuse   . ETOH abuse   . GERD (gastroesophageal reflux disease)   . History of participation in smoking cessation counseling   . Hypertension     Past Surgical History:  Procedure Laterality Date  . APPENDECTOMY    . clavicale surgery Left     Social History Social History  Substance Use Topics  . Smoking status: Current Every Day Smoker    Packs/day: 0.50    Types: Cigarettes  . Smokeless tobacco: Never Used  . Alcohol use No    Family History Family History  Problem Relation Age of Onset  . Prostate cancer Neg Hx   . Kidney cancer Neg Hx     No Known Allergies   REVIEW OF SYSTEMS (Negative unless  checked)  Constitutional: [] Weight loss  [] Fever  [] Chills Cardiac: [] Chest pain   [] Chest pressure   [] Palpitations   [] Shortness of breath when laying flat   [] Shortness of breath with exertion. Vascular:  [] Pain in legs with walking   [x] Pain in legs with standing  [] History of DVT   [] Phlebitis   [x] Swelling in legs   [x] Varicose veins   [] Non-healing ulcers Pulmonary:   [] Uses home oxygen   [] Productive cough   [] Hemoptysis   [] Wheeze  [] COPD   [] Asthma Neurologic:  [] Dizziness   [] Seizures   [] History of stroke   [] History of TIA  [] Aphasia   [] Vissual changes   [] Weakness or numbness in arm   [] Weakness or numbness in leg Musculoskeletal:   [] Joint swelling   [x] Joint pain   [] Low back pain Hematologic:  [] Easy bruising  [] Easy bleeding   [] Hypercoagulable state   [] Anemic Gastrointestinal:  [] Diarrhea   [] Vomiting  [] Gastroesophageal reflux/heartburn   [] Difficulty swallowing. Genitourinary:  [] Chronic kidney disease   [] Difficult urination  [] Frequent urination   [] Blood in urine Skin:  [] Rashes   [] Ulcers  Psychological:  [] History of anxiety   []  History of major depression.  Physical Examination  Vitals:   02/17/17 1637  BP: (!) 149/107  Pulse: 67  Resp: 16  Weight: 182 lb 6.4 oz (82.7 kg)  Height: 6\' 1"  (1.854 m)   Body mass index is 24.06 kg/m. Gen: WD/WN, NAD Head: [place]/AT, No temporalis wasting.  Ear/Nose/Throat: Hearing grossly intact, nares w/o erythema or drainage Eyes: PER, EOMI, sclera nonicteric.  Neck: Supple, no large masses.   Pulmonary:  Good air movement, no audible wheezing bilaterally, no use of accessory muscles.  Cardiac: RRR, no JVD Vascular: Large varicosities present extensively greater than 10 mm bilaterally.  Moderate venous stasis changes to the legs bilaterally.  2+ soft pitting edema Vessel Right Left  Radial Palpable Palpable  PT Palpable Palpable  DP Palpable Palpable  Gastrointestinal: Non-distended. No guarding/no peritoneal signs.   Musculoskeletal: M/S 5/5 throughout.  No deformity or atrophy.  Neurologic: CN 2-12 intact. Symmetrical.  Speech is fluent. Motor exam as listed above. Psychiatric: Judgment intact, Mood & affect appropriate for pt's clinical situation. Dermatologic: No rashes or ulcers noted.  No changes consistent with cellulitis. Lymph : No lichenification or skin changes of chronic lymphedema.  CBC Lab Results  Component Value Date   WBC 8.0 12/04/2016   HGB 16.6 12/04/2016   HCT 49.0 12/04/2016   MCV 92.2 12/04/2016   PLT 274 12/04/2016    BMET    Component Value Date/Time   NA 140 12/04/2016 1618   NA 138 07/20/2014 1716   K 3.9 12/04/2016 1618   K 3.5 07/20/2014 1716   CL 106 12/04/2016 1618   CL 104 07/20/2014 1716   CO2 26 12/04/2016 1618   CO2 24 07/20/2014 1716   GLUCOSE 99 12/04/2016 1618   GLUCOSE 96 07/20/2014 1716   BUN 16 12/04/2016 1618   BUN 16 07/20/2014 1716   CREATININE 1.11 12/04/2016 1618   CREATININE 1.00 07/20/2014 1716   CALCIUM 8.8 (L) 12/04/2016 1618   CALCIUM 8.6 07/20/2014 1716   GFRNONAA >60 12/04/2016 1618   GFRNONAA >60 07/20/2014 1716   GFRAA >60 12/04/2016 1618   GFRAA >60 07/20/2014 1716   CrCl cannot be calculated (Patient's most recent lab result is older than the maximum 21 days allowed.).  COAG No results found for: INR, PROTIME  Radiology US Venous Img Lower Bilateral  Result Date: 02/13/2017 CLINICAL DATA:  Acute onset of bilateral lower extremity pain and swelling. Initial encounter. EXAM: BILATERAL LOWER EXTREMITY VENOUS DOPPLER ULTRASOUND TECHNIQUE: Gray-scale sonography with graded compression, as well as color Doppler and duplex ultrasound were performed to evaluate the lower extremity deep venous systems from the level of the common femoral vein and including the common femoral, femoral, profunda femoral, popliteal and calf veins including the posterior tibial, peroneal and gastrocnemius veins when visible. The superficial great  saphenous vein was also interrogated. Spectral Doppler was utilized to evaluate flow at rest and with distal augmentation maneuvers in the common femoral, femoral and popliteal veins. COMPARISON:  None. FINDINGS: RIGHT LOWER EXTREMITY Common Femoral Vein: No evidence of thrombus. Normal compressibility, respiratory phasicity and response to augmentation. Saphenofemoral Junction: No evidence of thrombus. Normal compressibility and flow on color Doppler imaging. Profunda Femoral Vein: No evidence of thrombus. Normal compressibility and flow on color Doppler imaging. Femoral Vein: No evidence of thrombus. Normal compressibility, respiratory phasicity and response to augmentation. Popliteal Vein: No evidence of thrombus. Normal compressibility, respiratory phasicity and response to augmentation. Calf Veins: No evidence of thrombus. Normal compressibility and flow on color Doppler imaging. Superficial Great Saphenous Vein: No evidence of thrombus. Normal compressibility and flow on color Doppler imaging. Venous Reflux:  None. Other Findings:  None. LEFT LOWER EXTREMITY Common Femoral Vein: No evidence of thrombus. Normal compressibility, respiratory phasicity and response to augmentation. Saphenofemoral Junction: No evidence of thrombus. Normal compressibility and flow on  color Doppler imaging. Profunda Femoral Vein: No evidence of thrombus. Normal compressibility and flow on color Doppler imaging. Femoral Vein: No evidence of thrombus. Normal compressibility, respiratory phasicity and response to augmentation. Popliteal Vein: No evidence of thrombus. Normal compressibility, respiratory phasicity and response to augmentation. Calf Veins: Occlusive thrombus is noted at the left peroneal veins. The posterior tibial vein remains patent. Superficial Great Saphenous Vein: No evidence of thrombus. Normal compressibility and flow on color Doppler imaging. Venous Reflux:  None. Other Findings:  None. IMPRESSION: Occlusive deep  venous thrombosis noted within the left peroneal veins. These results were called by telephone at the time of interpretation on 02/13/2017 at 12:16 am to Dr. Roxan Hockey, who verbally acknowledged these results. Electronically Signed   By: Roanna Raider M.D.   On: 02/13/2017 00:20    Assessment/Plan 1. Varicose veins of both lower extremities with pain Recommend  I have reviewed my previous  discussion with the patient regarding  varicose veins and why they cause symptoms. Patient will continue  wearing graduated compression stockings class 1 on a daily basis, beginning first thing in the morning and removing them in the evening.    In addition, behavioral modification including elevation during the day was again discussed and this will continue.  The patient has utilized over the counter pain medications and has been exercising.  However, at this time conservative therapy has not alleviated the patient's symptoms of leg pain and swelling  Recommend: laser ablation of the right small saphenous vein and left great saphenous vein to eliminate the symptoms of pain and swelling of the lower extremities caused by the severe superficial venous reflux disease.  Patient states his right leg bothers him more than his left and therefore the right leg will be addressed first  2. Chronic venous insufficiency No surgery or intervention at this point in time.    I have had a long discussion with the patient regarding venous insufficiency and why it  causes symptoms. I have discussed with the patient the chronic skin changes that accompany venous insufficiency and the long term sequela such as infection and ulceration.  Patient will begin wearing graduated compression stockings class 1 (20-30 mmHg) or compression wraps on a daily basis a prescription was given. The patient will put the stockings on first thing in the morning and removing them in the evening. The patient is instructed specifically not to sleep in  the stockings.    In addition, behavioral modification including several periods of elevation of the lower extremities during the day will be continued. I have demonstrated that proper elevation is a position with the ankles at heart level.  The patient is instructed to begin routine exercise, especially walking on a daily basis  Following the review of the ultrasound the patient will follow up as needed to reassess the degree of swelling and the control that graduated compression stockings or compression wraps  is offering.   The patient can be assessed for a Lymph Pump at that time  3. Pain in both lower extremities See  #1 and #2  4. Gastroesophageal reflux disease without esophagitis Continue PPI as already ordered, these medications have been reviewed and there are no changes at this time.   5. Essential hypertension Continue antihypertensive medications as already ordered, these medications have been reviewed and there are no changes at this time.     Levora Dredge, MD  02/19/2017 10:21 AM

## 2017-02-20 ENCOUNTER — Encounter
Admission: RE | Admit: 2017-02-20 | Discharge: 2017-02-20 | Disposition: A | Discharge status: Home / Self Care | Payer: Medicaid Other | Source: Ambulatory Visit | Attending: Urology | Admitting: Urology

## 2017-02-20 HISTORY — DX: Personal history of Methicillin resistant Staphylococcus aureus infection: Z86.14

## 2017-02-20 NOTE — Patient Instructions (Signed)
  Your procedure is scheduled on: 02-28-17 FRIDAY Report to Same Day Surgery 2nd floor medical mall [facility](Medical Mall Entrance-take elevator on left to 2nd floor.  Check in with surgery information desk.) To find out your arrival time please call [phone] between 1PM - 3PM on 02-27-17 THURSDAY  Remember: Instructions that are not followed completely may result in serious medical risk, up to and including death, or upon the discretion of your surgeon and anesthesiologist your surgery may need to be rescheduled.    _x___ 1. Do not eat food or drink liquids after midnight. No gum chewing or hard candies.     __x__ 2. No Alcohol for 24 hours before or after surgery.   __x__3. No Smoking for 24 prior to surgery.   ____  4. Bring all medications with you on the day of surgery if instructed.    __x__ 5. Notify your doctor if there is any change in your medical condition     (cold, fever, infections).     Do not wear jewelry, make-up, hairpins, clips or nail polish.  Do not wear lotions, powders, or perfumes. You may wear deodorant.  Do not shave 48 hours prior to surgery. Men may shave face and neck.  Do not bring valuables to the hospital.    [facility] not responsible for any belongings or valuables.               Contacts, dentures or bridgework may not be worn into surgery.  Leave your suitcase in the car. After surgery it may be brought to your room.  For patients admitted to the hospital, discharge time is determined by your treatment team.   Patients discharged the day of surgery will not be allowed to drive home.  You will need someone to drive you home and stay with you the night of your procedure.    Please read over the following fact sheets that you were given:   [facility] Preparing [facility] and or MRSA Information   _x___ TAKE THE FOLLOWING MEDICATIONS THE MORNING OF SURGERY WITH A SMALL SIP OF WATER. These include:  1. RANITIDINE (ZANTAC)  2. GABAPENTIN  (NEURONTIN)  3.  4.  5.  6.  ____Fleets enema or Magnesium Citrate as directed.   ____ Use CHG Soap or sage wipes as directed on instruction sheet   ____ Use inhalers on the day of surgery and bring to hospital day of surgery  ____ Stop Metformin and Janumet 2 days prior to surgery.    ____ Take 1/2 of usual insulin dose the night before surgery and none on the morning surgery.   ____ Follow recommendations from Cardiologist, Pulmonologist or PCP regarding stopping Aspirin, Coumadin, Pllavix ,Eliquis, Effient, or Pradaxa, and Pletal.  X____Stop Anti-inflammatories such as Advil, Aleve, Ibuprofen, Motrin, Naproxen, Naprosyn, Goodies powders or aspirin products NOW-OK to take Tylenol    ____ Stop supplements until after surgery.     ____ Bring C-Pap to the hospital.

## 2017-02-24 ENCOUNTER — Inpatient Hospital Stay: Admission: RE | Admit: 2017-02-24 | Payer: Medicaid Other | Source: Ambulatory Visit

## 2017-02-25 ENCOUNTER — Encounter
Admission: RE | Admit: 2017-02-25 | Discharge: 2017-02-25 | Disposition: A | Discharge status: Home / Self Care | Payer: Medicaid Other | Source: Ambulatory Visit | Attending: Urology | Admitting: Urology

## 2017-02-25 DIAGNOSIS — Z01812 Encounter for preprocedural laboratory examination: Secondary | ICD-10-CM | POA: Diagnosis present

## 2017-02-25 DIAGNOSIS — K219 Gastro-esophageal reflux disease without esophagitis: Secondary | ICD-10-CM | POA: Insufficient documentation

## 2017-02-25 DIAGNOSIS — F1410 Cocaine abuse, uncomplicated: Secondary | ICD-10-CM | POA: Diagnosis not present

## 2017-02-25 DIAGNOSIS — F321 Major depressive disorder, single episode, moderate: Secondary | ICD-10-CM | POA: Diagnosis not present

## 2017-02-25 DIAGNOSIS — I83819 Varicose veins of unspecified lower extremities with pain: Secondary | ICD-10-CM | POA: Insufficient documentation

## 2017-02-25 DIAGNOSIS — F1994 Other psychoactive substance use, unspecified with psychoactive substance-induced mood disorder: Secondary | ICD-10-CM | POA: Insufficient documentation

## 2017-02-25 DIAGNOSIS — I10 Essential (primary) hypertension: Secondary | ICD-10-CM | POA: Insufficient documentation

## 2017-02-25 DIAGNOSIS — F1010 Alcohol abuse, uncomplicated: Secondary | ICD-10-CM | POA: Diagnosis not present

## 2017-02-25 DIAGNOSIS — M79606 Pain in leg, unspecified: Secondary | ICD-10-CM | POA: Diagnosis not present

## 2017-02-25 LAB — SURGICAL PCR SCREEN
MRSA, PCR: NEGATIVE
STAPHYLOCOCCUS AUREUS: NEGATIVE

## 2017-02-25 NOTE — Pre-Procedure Instructions (Signed)
UNABLE TO REACH PATIENT ALL NUMBERS LISTED TO REMIND TO COME FOR MRSA. DID NOT COME 02/24/17

## 2017-02-27 ENCOUNTER — Encounter: Payer: Self-pay | Admitting: Emergency Medicine

## 2017-02-27 ENCOUNTER — Emergency Department
Admission: EM | Admit: 2017-02-27 | Discharge: 2017-02-27 | Disposition: A | Discharge status: Home / Self Care | Payer: Medicaid Other | Attending: Emergency Medicine | Admitting: Emergency Medicine

## 2017-02-27 ENCOUNTER — Emergency Department: Payer: Medicaid Other

## 2017-02-27 DIAGNOSIS — G8929 Other chronic pain: Secondary | ICD-10-CM | POA: Insufficient documentation

## 2017-02-27 DIAGNOSIS — F17210 Nicotine dependence, cigarettes, uncomplicated: Secondary | ICD-10-CM | POA: Insufficient documentation

## 2017-02-27 DIAGNOSIS — I8393 Asymptomatic varicose veins of bilateral lower extremities: Secondary | ICD-10-CM

## 2017-02-27 DIAGNOSIS — Z79899 Other long term (current) drug therapy: Secondary | ICD-10-CM | POA: Diagnosis not present

## 2017-02-27 DIAGNOSIS — M79604 Pain in right leg: Secondary | ICD-10-CM | POA: Diagnosis not present

## 2017-02-27 DIAGNOSIS — Z86718 Personal history of other venous thrombosis and embolism: Secondary | ICD-10-CM | POA: Insufficient documentation

## 2017-02-27 DIAGNOSIS — M79605 Pain in left leg: Secondary | ICD-10-CM | POA: Diagnosis present

## 2017-02-27 LAB — COMPREHENSIVE METABOLIC PANEL
ALBUMIN: 4.4 g/dL (ref 3.5–5.0)
ALT: 34 U/L (ref 17–63)
ANION GAP: 11 (ref 5–15)
AST: 28 U/L (ref 15–41)
Alkaline Phosphatase: 70 U/L (ref 38–126)
BUN: 22 mg/dL — ABNORMAL HIGH (ref 6–20)
CHLORIDE: 105 mmol/L (ref 101–111)
CO2: 23 mmol/L (ref 22–32)
Calcium: 9.3 mg/dL (ref 8.9–10.3)
Creatinine, Ser: 1.09 mg/dL (ref 0.61–1.24)
GFR calc non Af Amer: >60 mL/min (ref >60)
GLUCOSE: 101 mg/dL — AB (ref 65–99)
Potassium: 4.1 mmol/L (ref 3.5–5.1)
SODIUM: 139 mmol/L (ref 135–145)
Total Bilirubin: 1.4 mg/dL — ABNORMAL HIGH (ref 0.3–1.2)
Total Protein: 7.4 g/dL (ref 6.5–8.1)

## 2017-02-27 LAB — CBC
HCT: 47.7 % (ref 40.0–52.0)
Hemoglobin: 16.9 g/dL (ref 13.0–18.0)
MCH: 32.7 pg (ref 26.0–34.0)
MCHC: 35.3 g/dL (ref 32.0–36.0)
MCV: 92.5 fL (ref 80.0–100.0)
PLATELETS: 243 10*3/uL (ref 150–440)
RBC: 5.16 MIL/uL (ref 4.40–5.90)
RDW: 14 % (ref 11.5–14.5)
WBC: 7.7 10*3/uL (ref 3.8–10.6)

## 2017-02-27 LAB — PROTIME-INR
INR: 1.08
Prothrombin Time: 14 seconds (ref 11.4–15.2)

## 2017-02-27 LAB — APTT: APTT: 30 s (ref 24–36)

## 2017-02-27 MED ORDER — CEFAZOLIN SODIUM-DEXTROSE 2-4 GM/100ML-% IV SOLN: 2.0000 g | INTRAVENOUS | Status: DC

## 2017-02-27 NOTE — ED Notes (Signed)
Vitals taken

## 2017-02-27 NOTE — ED Notes (Signed)
Patient updated about [place] results by Dr. York Cerise. Patient not in room when going to go over discharge paperwork.

## 2017-02-27 NOTE — ED Provider Notes (Signed)
[facility] Emergency Department Provider Note  ____________________________________________   First MD Initiated Contact with Patient 02/27/17 1549     (approximate)  I have reviewed the triage vital signs and the nursing notes.   HISTORY  Chief Complaint DVT    HPI Guy Rodriguez is a 36 y.o. male with medical history as listed below who presents for evaluation of DVT in his left leg.He came to the emergency department about 2 weeks ago and had the DVT but left without being seen.  It was positive for DVT in the left peroneal vein.  However he reports that his phone was not working so no one to get in touch with him.  By coincidence he had a clinic visit with Dr. Gilda Crease for evaluation of chronic varicose veins.  At that time Dr. Gilda Crease did not appreciate any DVTs but did not look specifically at the peroneal veins.  Today he received in the mail a letter from [facility] letting him know about the DVT and telling him to come immediately to the emergency department, which is why he is here today.  This is particularly relevant as well because he is scheduled for a vasectomy tomorrow with Dr. Sherryl Barters.  He currently is not on any anticoagulation.  He reports chronic and unchanged moderate pain in bilateral lower extremities, worse on the right than the left.  No significant swelling.  No numbness or tingling or weakness in any extremities.  Denies fever/chills, chest pain, shortness of breath, nausea, vomiting, abdominal pain.   Past Medical History:  Diagnosis Date  . Cocaine abuse   . ETOH abuse   . GERD (gastroesophageal reflux disease)   . History of methicillin resistant staphylococcus aureus (MRSA)   . History of participation in smoking cessation counseling   . Hypertension    H/O IN 2016-PCP TOOK PT OFF BP MED DUE TO BP CONTROLLED    Patient Active Problem List   Diagnosis Date Noted  . Leg pain 02/19/2017  . Varicose veins  of leg with pain 12/26/2016  . Chronic venous insufficiency 12/26/2016  . Essential hypertension 12/26/2016  . GERD (gastroesophageal reflux disease) 12/26/2016  . Substance induced mood disorder (HCC) 02/14/2016  . Alcohol abuse 02/14/2016  . Cocaine abuse 02/14/2016  . 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 APPENDECTOMY    . CARPAL TUNNEL RELEASE Right   . clavicale surgery Left     Prior to Admission medications   Medication Sig Start Date End Date Taking? Authorizing Provider  gabapentin (NEURONTIN) 600 MG tablet Take by mouth 3 (three) times daily.     [provider]  pregabalin (LYRICA) 300 MG capsule Take 1 capsule (300 mg total) by mouth 2 (two) times daily. Patient not taking: Reported on 02/20/2017 02/17/17   Schnier, Latina Craver, MD  ranitidine (ZANTAC) 300 MG tablet Take 150 mg by mouth 2 (two) times daily.     [provider]    Allergies Patient has no known allergies.  Family History  Problem Relation Age of Onset  . Prostate cancer Neg Hx   . Kidney cancer Neg Hx     Social History Social History  Substance Use Topics  . Smoking status: Current Every Day Smoker    Packs/day: 0.50    Years: 13.00    Types: Cigarettes  . Smokeless tobacco: Never Used  . Alcohol use Yes     Comment: OCC  Review of Systems Constitutional: No fever/chills Eyes: No visual changes. ENT: No sore throat. Cardiovascular: Denies chest pain. Respiratory: Denies shortness of breath. Gastrointestinal: No abdominal pain.  No nausea, no vomiting.  No diarrhea.  No constipation. Genitourinary: Negative for dysuria. Musculoskeletal: Running pain in bilateral lower extremities thought to be secondary to varicose veins.  Negative for neck pain.  Negative for back pain. Integumentary: Negative for rash. Neurological: Negative for headaches, focal weakness or  numbness.   ____________________________________________   PHYSICAL EXAM:  VITAL SIGNS: ED Triage Vitals  Enc Vitals Group     BP 02/27/17 1401 (!) 149/105     Pulse Rate 02/27/17 1401 86     Resp 02/27/17 1401 13     Temp 02/27/17 1401 98 F (36.7 C)     Temp src --      SpO2 02/27/17 1401 99 %     Weight 02/27/17 1402 79.4 kg (175 lb)     Height 02/27/17 1402 1.854 m (6\' 1" )     Head Circumference --      Peak Flow --      Pain Score 02/27/17 1408 8     Pain Loc --      Pain Edu? --      Excl. in GC? --     Constitutional: Alert and oriented. Well appearing and in no acute distress. Eyes: Conjunctivae are normal.  Head: Atraumatic. Nose: No congestion/rhinnorhea. Mouth/Throat: Mucous membranes are moist. Neck: No stridor.  No meningeal signs.   Cardiovascular: Normal rate, regular rhythm. Good peripheral circulation. Grossly normal heart sounds. Respiratory: Normal respiratory effort.  No retractions. Lungs CTAB. Gastrointestinal: Soft and nontender. No distention.  Musculoskeletal: No lower extremity tenderness nor edema. No gross deformities of extremities. Neurologic:  Normal speech and language. No gross focal neurologic deficits are appreciated.  Skin:  Skin is warm, dry and intact. No rash noted. Psychiatric: Mood and affect are normal. Speech and behavior are normal.  ____________________________________________   LABS (all labs ordered are listed, but only abnormal results are displayed)  Labs Reviewed  COMPREHENSIVE METABOLIC PANEL - Abnormal; Notable for the following:       Result Value   Glucose, Bld 101 (*)    BUN 22 (*)    Total Bilirubin 1.4 (*)    All other components within normal limits  CBC  PROTIME-INR  APTT   ____________________________________________  EKG  None - EKG not ordered by ED physician ____________________________________________  RADIOLOGY   US Venous Img Lower Bilateral  Result Date: 02/27/2017 CLINICAL DATA:   Pain and edema EXAM: BILATERAL LOWER EXTREMITY VENOUS DOPPLER ULTRASOUND TECHNIQUE: Gray-scale sonography with graded compression, as well as color Doppler and duplex ultrasound were performed to evaluate the lower extremity deep venous systems from the level of the common femoral vein and including the common femoral, femoral, profunda femoral, popliteal and calf veins including the posterior tibial, peroneal and gastrocnemius veins when visible. The superficial great saphenous vein was also interrogated. Spectral Doppler was utilized to evaluate flow at rest and with distal augmentation maneuvers in the common femoral, femoral and popliteal veins. COMPARISON:  February 12, 2017 FINDINGS: RIGHT LOWER EXTREMITY Common Femoral Vein: No evidence of thrombus. Normal compressibility, respiratory phasicity and response to augmentation. Saphenofemoral Junction: No evidence of thrombus. Normal compressibility and flow on color Doppler imaging. Profunda Femoral Vein: No evidence of thrombus. Normal compressibility and flow on color Doppler imaging. Femoral Vein: No evidence of thrombus. Normal compressibility, respiratory phasicity and response to augmentation.  Popliteal Vein: No evidence of thrombus. Normal compressibility, respiratory phasicity and response to augmentation. Calf Veins: No evidence of thrombus. Normal compressibility and flow on color Doppler imaging. Superficial Great Saphenous Vein: No evidence of thrombus. Normal compressibility and flow on color Doppler imaging. Venous Reflux:  None. Other Findings:  None. LEFT LOWER EXTREMITY Common Femoral Vein: No evidence of thrombus. Normal compressibility, respiratory phasicity and response to augmentation. Saphenofemoral Junction: No evidence of thrombus. Normal compressibility and flow on color Doppler imaging. Profunda Femoral Vein: No evidence of thrombus. Normal compressibility and flow on color Doppler imaging. Femoral Vein: No evidence of thrombus. Normal  compressibility, respiratory phasicity and response to augmentation. Popliteal Vein: No evidence of thrombus. Normal compressibility, respiratory phasicity and response to augmentation. Calf Veins: No evidence of thrombus. Normal compressibility and flow on color Doppler imaging. Superficial Great Saphenous Vein: No evidence of thrombus. Normal compressibility and flow on color Doppler imaging. Venous Reflux:  None. Other Findings:  None. IMPRESSION: No evidence of DVT within either lower extremity. The previously identified DVT on February 12, 2017 is not visualized today, apparently resolved. Electronically Signed   By: Gerome Sam III M.D   On: 02/27/2017 17:59    ____________________________________________   PROCEDURES  Critical Care performed: No   Procedure(s) performed:   Procedures   ____________________________________________   INITIAL IMPRESSION / ASSESSMENT AND PLAN / ED COURSE  Pertinent labs & imaging results that were available during my care of the patient were reviewed by me and considered in my medical decision making (see chart for details).  I verified in the computer that the patient had a positive thrombus in the left peroneal vein, not in the right leg that is causing him more pain.  I also reviewed the medical records from Dr. Marijean Heath clinic visit.  Dr. Gilda Crease is on-call today so I will touch base with him and ask him his opinion regarding repeat imaging, the need for treatment, waiting until after the vasectomy, etc.   Clinical Course as of Feb 27 1817  Thu Feb 27, 2017  1609 spoke with Dr. Gilda Crease by phone.  He recommended repeating bilateral lower extremity ultrasounds since the patient is having pain in the right leg but the peroneal DVT was on the left.  He felt like if the thrombus was essentially unchanged and not propagating to the popliteal veins, the patient does not need treatment and can continue with his vasectomy tomorrow and follow-up with  vascular clinic as planned.  I will reassess after the ultrasound is complete.  [CF]  1806 No evidence of DVT on today's study, and the previous thrombus seems to have resolved.  Patient will follow up tomorrow with urology as planned with in the future with Dr. Gilda Crease as planned. US Venous Img Lower Bilateral [CF]    Clinical Course User Index [CF] Loleta Rose, MD    ____________________________________________  FINAL CLINICAL IMPRESSION(S) / ED DIAGNOSES  Final diagnoses:  Varicose veins of both lower extremities without ulcer or inflammation  Chronic pain of both lower extremities     MEDICATIONS GIVEN DURING THIS VISIT:  Medications - No data to display   NEW OUTPATIENT MEDICATIONS STARTED DURING THIS VISIT:  New Prescriptions   No medications on file    Modified Medications   No medications on file    Discontinued Medications   No medications on file     Note:  This document was prepared using Dragon voice recognition software and may include unintentional dictation errors.  Loleta Rose, MD 02/27/17 [phone]

## 2017-02-27 NOTE — Discharge Instructions (Signed)
Fortunately your ultrasound today did not show the DVT (blood clot) that was seen 2 weeks ago.  You do not need anticoagulation at this time.  Please continue with your regular medications and follow up tomorrow as scheduled for your vasectomy and with Dr. Gilda Crease as planned for vascular surgery follow-up.

## 2017-02-27 NOTE — ED Triage Notes (Signed)
Pt reports received a letter today that states he has DVT in right leg. Pt reports had ultrasound here and at vein clinic. Pt was seen here recently but LWBS by MD. Pt reports is scheduled to have a vasectomy tomorrow. Pt reports right leg pain. Orders received from Dr. Scotty Court.

## 2017-02-28 ENCOUNTER — Ambulatory Visit: Payer: Medicaid Other | Admitting: Anesthesiology

## 2017-02-28 ENCOUNTER — Ambulatory Visit
Admission: RE | Admit: 2017-02-28 | Discharge: 2017-02-28 | Disposition: A | Discharge status: Home / Self Care | Payer: Medicaid Other | Source: Ambulatory Visit | Attending: Urology | Admitting: Urology

## 2017-02-28 ENCOUNTER — Encounter: Payer: Self-pay | Admitting: *Deleted

## 2017-02-28 ENCOUNTER — Encounter
Admission: RE | Disposition: A | Discharge status: Home / Self Care | Payer: Self-pay | Source: Ambulatory Visit | Attending: Urology

## 2017-02-28 DIAGNOSIS — Z302 Encounter for sterilization: Secondary | ICD-10-CM

## 2017-02-28 DIAGNOSIS — Z3009 Encounter for other general counseling and advice on contraception: Secondary | ICD-10-CM

## 2017-02-28 DIAGNOSIS — Z538 Procedure and treatment not carried out for other reasons: Secondary | ICD-10-CM | POA: Insufficient documentation

## 2017-02-28 HISTORY — PX: VASECTOMY: SHX75

## 2017-02-28 LAB — URINE DRUG SCREEN, QUALITATIVE (ARMC ONLY)
AMPHETAMINES, UR SCREEN: NOT DETECTED
BENZODIAZEPINE, UR SCRN: NOT DETECTED
Barbiturates, Ur Screen: NOT DETECTED
Cannabinoid 50 Ng, Ur ~~LOC~~: POSITIVE — AB
Cocaine Metabolite,Ur ~~LOC~~: POSITIVE — AB
MDMA (ECSTASY) UR SCREEN: NOT DETECTED
METHADONE SCREEN, URINE: NOT DETECTED
Opiate, Ur Screen: NOT DETECTED
Phencyclidine (PCP) Ur S: NOT DETECTED
TRICYCLIC, UR SCREEN: NOT DETECTED

## 2017-02-28 SURGERY — VASECTOMY
Anesthesia: General

## 2017-02-28 MED ORDER — CEFAZOLIN SODIUM-DEXTROSE 2-4 GM/100ML-% IV SOLN
INTRAVENOUS | Status: AC
  Filled 2017-02-28: qty 100

## 2017-02-28 MED ORDER — LACTATED RINGERS IV SOLN
INTRAVENOUS | Status: DC
  Administered 2017-02-28: 11:00:00 via INTRAVENOUS

## 2017-02-28 NOTE — Interval H&P Note (Signed)
History and Physical Interval Note:  02/28/2017 11:26 AM  Guy Rodriguez  has presented today for surgery, with the diagnosis of FAMILY PLANNING  The various methods of treatment have been discussed with the patient and family. After consideration of risks, benefits and other options for treatment, the patient has consented to  Procedure(s): VASECTOMY (N/A) as a surgical intervention .  The patient's history has been reviewed, patient examined, no change in status, stable for surgery.  I have reviewed the patient's chart and labs.  Questions were answered to the patient's satisfaction.    RRR Lungs clear  Hildred Laser

## 2017-02-28 NOTE — OR Nursing (Signed)
Dr. Noralyn Pick in to speak with patient re lab results.  Dr. Sherryl Barters in to see pt and advised him that office would contact him to reschedule surgery.  IV d/c'd.

## 2017-02-28 NOTE — H&P (View-Only) (Signed)
  02/04/2017 9:31 AM   Guy Rodriguez 09/09/1980 [id number]  Referring provider: Santayana, Gloria Patricia, DO [address] [place] [address] [place], [place] [address]  Chief Complaint  Patient presents with  . VAS Consult    New patient    HPI: [name] is a 36 year old Caucasian male presents today requesting a vasectomy.  Patient has three children, one son and two daughters, who desires no further biological children.  Patient denies any history of chronic prostatitis, epididymitis, orchitis, or other genital pain.  Today, we discussed what the vas deferens is, where it is located, and its function. We reviewed the procedure for vasectomy, it's risks, benefits, alternatives, and likelihood of achieving his goals.   We discussed in detail the procedure, complications, and recovery as well as the need for clearance prior to unprotected intercourse. We discussed that vasectomy does not protect against sexually transmitted diseases. We discussed that this procedure does not result in immediate sterility and that they would need to use other forms of birth control until he has been cleared with a three month negative postvasectomy semen analyses.  I explained that the procedure is considered to be permanent and that attempts at reversal have varying degrees of success. These options include vasectomy reversal, sperm retrieval, and in vitro fertilization; these can be very expensive.   We discussed the chance of postvasectomy pain syndrome which occurs in less than 5% of patients. I explained to the patient that there is no treatment to resolve this chronic pain, and that if it developed I would not be able to help resolve the issue, but that surgery is generally not needed for correction.   I explained there have even been reports of systemic like illness associated with this chronic pain, and that there was no good cure. I explained that vasectomy it is not a 100% reliable form of birth  control, and the risk of pregnancy after vasectomy is approximately 1 in 2000 men who had a negative postvasectomy semen analysis or rare non-motile sperm.  I explained that repeat vasectomy was necessary in less than 1% of vasectomy procedures when employing the type of technique that is performed in the office. I explained that he should refrain from ejaculation for approximately one week following vasectomy. I explained that there are other options for birth control which are permanent and non-permanent; we discussed these.  I explained the rates of surgical complications, such as symptomatic hematoma or infection, are low (1-2%) and vary with the surgeon's experience and criteria used to diagnose the complication.   PMH: Past Medical History:  Diagnosis Date  . Cocaine abuse   . ETOH abuse   . GERD (gastroesophageal reflux disease)   . History of participation in smoking cessation counseling   . Hypertension     Surgical History: Past Surgical History:  Procedure Laterality Date  . APPENDECTOMY    . clavicale surgery Left     Home Medications:  Allergies as of 02/04/2017   No Known Allergies     Medication List       Accurate as of 02/04/17  9:31 AM. Always use your most recent med list.          diazepam 10 MG tablet Commonly known as:  VALIUM Take one tablet 30 minutes prior to the vasectomy   gabapentin 600 MG tablet Commonly known as:  NEURONTIN Take by mouth 3 (three) times daily.   ranitidine 300 MG tablet Commonly known as:  ZANTAC Take 150   mg by mouth 2 (two) times daily.       Allergies: No Known Allergies  Family History: Family History  Problem Relation Age of Onset  . Prostate cancer Neg Hx   . Kidney cancer Neg Hx     Social History:  reports that he has been smoking Cigarettes.  He has been smoking about 0.50 packs per day. He has never used smokeless tobacco. He reports that he does not drink alcohol or use drugs.  ROS: UROLOGY Frequent  Urination?: No Hard to postpone urination?: No Burning/pain with urination?: No Get up at night to urinate?: No Leakage of urine?: No Urine stream starts and stops?: No Trouble starting stream?: No Do you have to strain to urinate?: No Blood in urine?: No Urinary tract infection?: No Sexually transmitted disease?: No Injury to kidneys or bladder?: No Painful intercourse?: No Weak stream?: No Erection problems?: No Penile pain?: No  Gastrointestinal Nausea?: No Vomiting?: No Indigestion/heartburn?: No Diarrhea?: No Constipation?: No  Constitutional Fever: No Night sweats?: No Weight loss?: No Fatigue?: No  Skin Skin rash/lesions?: No Itching?: No  Eyes Blurred vision?: No Double vision?: No  Ears/Nose/Throat Sore throat?: No Sinus problems?: No  Hematologic/Lymphatic Swollen glands?: No Easy bruising?: No  Cardiovascular Leg swelling?: No Chest pain?: No  Respiratory Cough?: No Shortness of breath?: No  Endocrine Excessive thirst?: No  Musculoskeletal Back pain?: No Joint pain?: No  Neurological Headaches?: No Dizziness?: No  Psychologic Depression?: No Anxiety?: No  Physical Exam: BP (!) 144/97   Pulse 64   Ht 6\' 1"  (1.854 m)   Wt 180 lb (81.6 kg)   BMI 23.75 kg/m   Constitutional: Well nourished. Alert and oriented, No acute distress. HEENT: [place] AT, moist mucus membranes. Trachea midline, no masses. Cardiovascular: No clubbing, cyanosis, or edema. Respiratory: Normal respiratory effort, no increased work of breathing. GI: Abdomen is soft, non tender, non distended, no abdominal masses. Liver and spleen not palpable.  No hernias appreciated.  Stool sample for occult testing is not indicated.   GU: No CVA tenderness.  No bladder fullness or masses.  Patient with circumcised phallus.  Urethral meatus is patent.  No penile discharge. No penile lesions or rashes. Scrotum without lesions, cysts, rashes and/or edema.  Testicles are located  scrotally bilaterally. No masses are appreciated in the testicles. Left and right epididymis are normal. Rectal: Not examined.   Skin: No rashes, bruises or suspicious lesions. Lymph: No cervical or inguinal adenopathy. Neurologic: Grossly intact, no focal deficits, moving all 4 extremities. Psychiatric: Normal mood and affect.  Laboratory Data: Lab Results  Component Value Date   WBC 8.0 12/04/2016   HGB 16.6 12/04/2016   HCT 49.0 12/04/2016   MCV 92.2 12/04/2016   PLT 274 12/04/2016    Lab Results  Component Value Date   CREATININE 1.11 12/04/2016     Lab Results  Component Value Date   AST 22 02/14/2016   Lab Results  Component Value Date   ALT 21 02/14/2016    I have reviewed the labs.   Assessment & Plan:    1. Vasectomy consult:  Patient has read and signed the consent.  He is given the pre-op vasectomy instruction sheet.  He is prescribed Valium 10 mg and instructed to take it 30 minutes prior to his vasectomy appointment.  He is to have a driver.  I reemphasized to the patient that this is to be considered a permanent form of birth control, that he is to use an  alternative form of birth control until we receive the 3 months specimen and it is cleared of sperm and that this will not prevent STI's.  His questions are answered to his satisfaction and he understands the risks and is willing to proceed with the vasectomy.  He will schedule his vasectomy.    I spent 30 minutes in a face-to-face conversation concerning the vasectomy procedure and pre-and post op expectations.  Greater than 50% was spent in counseling & coordination of care with the patient.   Return for schedule vas.  These notes generated with voice recognition software. I apologize for typographical errors.  Michiel Cowboy, PA-C  [facility] [address] [place], [place] [address] [phone]

## 2017-02-28 NOTE — Anesthesia Preprocedure Evaluation (Signed)
Anesthesia Evaluation  Patient identified by MRN, date of birth, ID band Patient awake    Reviewed: Allergy & Precautions, NPO status , Patient's Chart, lab work & pertinent test results  Airway Mallampati: II  TM Distance: >3 FB     Dental  (+) Chipped   Pulmonary Current Smoker,    Pulmonary exam normal        Cardiovascular hypertension, Normal cardiovascular exam     Neuro/Psych PSYCHIATRIC DISORDERS Depression negative neurological ROS     GI/Hepatic GERD  Medicated and Controlled,(+)     substance abuse  alcohol use and cocaine use,   Endo/Other  negative endocrine ROS  Renal/GU negative Renal ROS  negative genitourinary   Musculoskeletal   Abdominal Normal abdominal exam  (+)   Peds negative pediatric ROS (+)  Hematology negative hematology ROS (+)   Anesthesia Other Findings   Reproductive/Obstetrics                             Anesthesia Physical Anesthesia Plan  ASA: II  Anesthesia Plan: General   Post-op Pain Management:    Induction: Intravenous  PONV Risk Score and Plan:   Airway Management Planned: LMA  Additional Equipment:   Intra-op Plan:   Post-operative Plan: Extubation in OR  Informed Consent: I have reviewed the patients History and Physical, chart, labs and discussed the procedure including the risks, benefits and alternatives for the proposed anesthesia with the patient or authorized representative who has indicated his/her understanding and acceptance.   Dental advisory given  Plan Discussed with: CRNA and Surgeon  Anesthesia Plan Comments:         Anesthesia Quick Evaluation

## 2017-02-28 NOTE — Progress Notes (Signed)
Patient UDS positive for cocaine. Will rescheduled surgery.

## 2017-03-03 ENCOUNTER — Encounter: Payer: Self-pay | Admitting: Urology

## 2017-03-03 ENCOUNTER — Telehealth: Payer: Self-pay | Admitting: Radiology

## 2017-03-03 NOTE — Telephone Encounter (Signed)
LMOM. Need to reschedule vasectomy.

## 2017-03-03 NOTE — Telephone Encounter (Signed)
-----   Message from Hildred Laser, MD sent at 02/28/2017 11:42 AM EDT ----- Patient had cocaine on UDS. Will need to reschedule vas.

## 2017-03-06 NOTE — Telephone Encounter (Signed)
LMOM

## 2017-03-13 ENCOUNTER — Other Ambulatory Visit: Payer: Self-pay | Admitting: Radiology

## 2017-03-13 DIAGNOSIS — Z3009 Encounter for other general counseling and advice on contraception: Secondary | ICD-10-CM

## 2017-03-13 DIAGNOSIS — Z302 Encounter for sterilization: Secondary | ICD-10-CM

## 2017-03-13 NOTE — Telephone Encounter (Signed)
Pt called to reschedule vasectomy. Will reschedule for 03/21/17 with Dr Sherryl BartersBudzyn. Advised pt that UDS would be repeated & that we will be unable to keep rescheduling if it is again positive. Pt voices understanding.

## 2017-03-14 NOTE — Telephone Encounter (Signed)
Advised pt to go to pre-admit testing 03/18/17 @8 :00 for urine drug screen prior to surgery. Pt voices understanding.

## 2017-03-18 ENCOUNTER — Encounter
Admission: RE | Admit: 2017-03-18 | Discharge: 2017-03-18 | Disposition: A | Discharge status: Home / Self Care | Payer: Medicaid Other | Source: Ambulatory Visit | Attending: Urology | Admitting: Urology

## 2017-03-18 DIAGNOSIS — Z01812 Encounter for preprocedural laboratory examination: Secondary | ICD-10-CM | POA: Diagnosis present

## 2017-03-18 LAB — URINE DRUG SCREEN, QUALITATIVE (ARMC ONLY)
Amphetamines, Ur Screen: NOT DETECTED
BARBITURATES, UR SCREEN: NOT DETECTED
BENZODIAZEPINE, UR SCRN: NOT DETECTED
CANNABINOID 50 NG, UR ~~LOC~~: POSITIVE — AB
Cocaine Metabolite,Ur ~~LOC~~: NOT DETECTED
MDMA (Ecstasy)Ur Screen: NOT DETECTED
METHADONE SCREEN, URINE: NOT DETECTED
Opiate, Ur Screen: NOT DETECTED
Phencyclidine (PCP) Ur S: NOT DETECTED
TRICYCLIC, UR SCREEN: NOT DETECTED

## 2017-03-19 NOTE — Telephone Encounter (Signed)
Notified pt that surgery has been postponed to 03/28/17 due to the weather. Pt voices understanding.

## 2017-03-28 ENCOUNTER — Ambulatory Visit: Payer: Medicaid Other | Admitting: Anesthesiology

## 2017-03-28 ENCOUNTER — Encounter
Admission: RE | Disposition: A | Discharge status: Home / Self Care | Payer: Self-pay | Source: Ambulatory Visit | Attending: Urology

## 2017-03-28 ENCOUNTER — Ambulatory Visit
Admission: RE | Admit: 2017-03-28 | Discharge: 2017-03-28 | Disposition: A | Discharge status: Home / Self Care | Payer: Medicaid Other | Source: Ambulatory Visit | Attending: Urology | Admitting: Urology

## 2017-03-28 DIAGNOSIS — Z302 Encounter for sterilization: Secondary | ICD-10-CM | POA: Diagnosis not present

## 2017-03-28 DIAGNOSIS — Z3009 Encounter for other general counseling and advice on contraception: Secondary | ICD-10-CM

## 2017-03-28 DIAGNOSIS — F329 Major depressive disorder, single episode, unspecified: Secondary | ICD-10-CM | POA: Diagnosis not present

## 2017-03-28 DIAGNOSIS — F1010 Alcohol abuse, uncomplicated: Secondary | ICD-10-CM | POA: Insufficient documentation

## 2017-03-28 DIAGNOSIS — F1410 Cocaine abuse, uncomplicated: Secondary | ICD-10-CM | POA: Diagnosis not present

## 2017-03-28 DIAGNOSIS — I10 Essential (primary) hypertension: Secondary | ICD-10-CM | POA: Diagnosis not present

## 2017-03-28 DIAGNOSIS — K219 Gastro-esophageal reflux disease without esophagitis: Secondary | ICD-10-CM | POA: Diagnosis not present

## 2017-03-28 DIAGNOSIS — F17210 Nicotine dependence, cigarettes, uncomplicated: Secondary | ICD-10-CM | POA: Diagnosis not present

## 2017-03-28 DIAGNOSIS — Z79899 Other long term (current) drug therapy: Secondary | ICD-10-CM | POA: Insufficient documentation

## 2017-03-28 HISTORY — PX: VASECTOMY: SHX75

## 2017-03-28 LAB — URINE DRUG SCREEN, QUALITATIVE (ARMC ONLY)
AMPHETAMINES, UR SCREEN: NOT DETECTED
BARBITURATES, UR SCREEN: NOT DETECTED
BENZODIAZEPINE, UR SCRN: NOT DETECTED
Cannabinoid 50 Ng, Ur ~~LOC~~: POSITIVE — AB
Cocaine Metabolite,Ur ~~LOC~~: NOT DETECTED
MDMA (Ecstasy)Ur Screen: NOT DETECTED
Methadone Scn, Ur: NOT DETECTED
OPIATE, UR SCREEN: NOT DETECTED
PHENCYCLIDINE (PCP) UR S: NOT DETECTED
Tricyclic, Ur Screen: NOT DETECTED

## 2017-03-28 SURGERY — VASECTOMY
Anesthesia: General | Laterality: Bilateral | Wound class: Clean

## 2017-03-28 MED ORDER — SUCCINYLCHOLINE CHLORIDE 20 MG/ML IJ SOLN
INTRAMUSCULAR | Status: AC
  Filled 2017-03-28: qty 1

## 2017-03-28 MED ORDER — MIDAZOLAM HCL 2 MG/2ML IJ SOLN
INTRAMUSCULAR | Status: DC | PRN
  Administered 2017-03-28: 2 mg via INTRAVENOUS

## 2017-03-28 MED ORDER — GLYCOPYRROLATE 0.2 MG/ML IJ SOLN
INTRAMUSCULAR | Status: AC
  Filled 2017-03-28: qty 3

## 2017-03-28 MED ORDER — BUPIVACAINE HCL 0.5 % IJ SOLN
INTRAMUSCULAR | Status: DC | PRN
  Administered 2017-03-28: 10 mL

## 2017-03-28 MED ORDER — CEFAZOLIN SODIUM-DEXTROSE 2-4 GM/100ML-% IV SOLN
INTRAVENOUS | Status: AC
  Filled 2017-03-28: qty 100

## 2017-03-28 MED ORDER — PROPOFOL 10 MG/ML IV BOLUS
INTRAVENOUS | Status: DC | PRN
  Administered 2017-03-28: 200 mg via INTRAVENOUS

## 2017-03-28 MED ORDER — GLYCOPYRROLATE 0.2 MG/ML IJ SOLN
INTRAMUSCULAR | Status: DC | PRN
  Administered 2017-03-28: 0.2 mg via INTRAVENOUS

## 2017-03-28 MED ORDER — ACETAMINOPHEN 10 MG/ML IV SOLN
INTRAVENOUS | Status: AC
  Filled 2017-03-28: qty 100

## 2017-03-28 MED ORDER — FENTANYL CITRATE (PF) 100 MCG/2ML IJ SOLN
INTRAMUSCULAR | Status: AC
  Filled 2017-03-28: qty 2

## 2017-03-28 MED ORDER — PHENYLEPHRINE HCL 10 MG/ML IJ SOLN
INTRAMUSCULAR | Status: AC
  Filled 2017-03-28: qty 1

## 2017-03-28 MED ORDER — FENTANYL CITRATE (PF) 100 MCG/2ML IJ SOLN: 25.0000 ug | INTRAMUSCULAR | Status: DC | PRN

## 2017-03-28 MED ORDER — DEXAMETHASONE SODIUM PHOSPHATE 10 MG/ML IJ SOLN
INTRAMUSCULAR | Status: DC | PRN
  Administered 2017-03-28: 10 mg via INTRAVENOUS

## 2017-03-28 MED ORDER — PROPOFOL 10 MG/ML IV BOLUS
INTRAVENOUS | Status: AC
  Filled 2017-03-28: qty 60

## 2017-03-28 MED ORDER — ACETAMINOPHEN 10 MG/ML IV SOLN
INTRAVENOUS | Status: DC | PRN
  Administered 2017-03-28: 1000 mg via INTRAVENOUS

## 2017-03-28 MED ORDER — LACTATED RINGERS IV SOLN
INTRAVENOUS | Status: DC
  Administered 2017-03-28 (×2): via INTRAVENOUS

## 2017-03-28 MED ORDER — ONDANSETRON HCL 4 MG/2ML IJ SOLN
INTRAMUSCULAR | Status: AC
  Filled 2017-03-28: qty 2

## 2017-03-28 MED ORDER — DEXMEDETOMIDINE HCL IN NACL 80 MCG/20ML IV SOLN
INTRAVENOUS | Status: AC
  Filled 2017-03-28: qty 20

## 2017-03-28 MED ORDER — DEXAMETHASONE SODIUM PHOSPHATE 10 MG/ML IJ SOLN
INTRAMUSCULAR | Status: AC
  Filled 2017-03-28: qty 1

## 2017-03-28 MED ORDER — KETOROLAC TROMETHAMINE 30 MG/ML IJ SOLN
INTRAMUSCULAR | Status: AC
  Filled 2017-03-28: qty 1

## 2017-03-28 MED ORDER — CEFAZOLIN SODIUM-DEXTROSE 2-4 GM/100ML-% IV SOLN: 2.0000 g | INTRAVENOUS | Status: DC

## 2017-03-28 MED ORDER — CEFAZOLIN SODIUM-DEXTROSE 2-3 GM-% IV SOLR
INTRAVENOUS | Status: DC | PRN
  Administered 2017-03-28: 2 g via INTRAVENOUS

## 2017-03-28 MED ORDER — PROPOFOL 10 MG/ML IV BOLUS
INTRAVENOUS | Status: AC
  Filled 2017-03-28: qty 20

## 2017-03-28 MED ORDER — FENTANYL CITRATE (PF) 100 MCG/2ML IJ SOLN
INTRAMUSCULAR | Status: DC | PRN
  Administered 2017-03-28 (×2): 50 ug via INTRAVENOUS

## 2017-03-28 MED ORDER — MIDAZOLAM HCL 2 MG/2ML IJ SOLN
INTRAMUSCULAR | Status: AC
  Filled 2017-03-28: qty 2

## 2017-03-28 MED ORDER — ONDANSETRON HCL 4 MG/2ML IJ SOLN
INTRAMUSCULAR | Status: DC | PRN
  Administered 2017-03-28: 4 mg via INTRAVENOUS

## 2017-03-28 MED ORDER — OXYCODONE-ACETAMINOPHEN 5-325 MG PO TABS
ORAL_TABLET | ORAL | Status: AC
  Filled 2017-03-28: qty 1

## 2017-03-28 MED ORDER — EPHEDRINE SULFATE 50 MG/ML IJ SOLN
INTRAMUSCULAR | Status: DC | PRN
  Administered 2017-03-28: 10 mg via INTRAVENOUS

## 2017-03-28 MED ORDER — OXYCODONE-ACETAMINOPHEN 5-325 MG PO TABS: 1.0000 | ORAL_TABLET | ORAL | 0 refills | Status: DC | PRN

## 2017-03-28 MED ORDER — SEVOFLURANE IN SOLN
RESPIRATORY_TRACT | Status: AC
  Filled 2017-03-28: qty 250

## 2017-03-28 MED ORDER — EPHEDRINE SULFATE 50 MG/ML IJ SOLN
INTRAMUSCULAR | Status: AC
  Filled 2017-03-28: qty 1

## 2017-03-28 MED ORDER — BUPIVACAINE HCL (PF) 0.5 % IJ SOLN
INTRAMUSCULAR | Status: AC
  Filled 2017-03-28: qty 30

## 2017-03-28 MED ORDER — ONDANSETRON HCL 4 MG/2ML IJ SOLN: 4.0000 mg | Freq: Once | INTRAMUSCULAR | Status: DC | PRN

## 2017-03-28 MED ORDER — OXYCODONE-ACETAMINOPHEN 5-325 MG PO TABS
1.0000 | ORAL_TABLET | ORAL | Status: DC | PRN
  Administered 2017-03-28: 1 via ORAL

## 2017-03-28 SURGICAL SUPPLY — 39 items
BLADE CLIPPER SURG (BLADE) ×3 IMPLANT
BLADE SURG 15 STRL LF DISP TIS (BLADE) ×1 IMPLANT
BLADE SURG 15 STRL SS (BLADE) ×2
CANISTER SUCT 1200ML W/VALVE (MISCELLANEOUS) ×3 IMPLANT
CHLORAPREP W/TINT 26ML (MISCELLANEOUS) ×3 IMPLANT
DERMABOND ADVANCED (GAUZE/BANDAGES/DRESSINGS) ×2
DERMABOND ADVANCED .7 DNX12 (GAUZE/BANDAGES/DRESSINGS) ×1 IMPLANT
DRAPE LAPAROTOMY 77X122 PED (DRAPES) ×3 IMPLANT
DRSG TELFA 4X3 1S NADH ST (GAUZE/BANDAGES/DRESSINGS) ×3 IMPLANT
ELECT CAUTERY NEEDLE 2.0 MIC (NEEDLE) ×3 IMPLANT
ELECT CAUTERY NEEDLE TIP 1.0 (MISCELLANEOUS) ×3
ELECT REM PT RETURN 9FT ADLT (ELECTROSURGICAL) ×3
ELECTRODE CAUTERY NEDL TIP 1.0 (MISCELLANEOUS) ×1 IMPLANT
ELECTRODE REM PT RTRN 9FT ADLT (ELECTROSURGICAL) ×1 IMPLANT
GAUZE FLUFF 18X24 1PLY STRL (GAUZE/BANDAGES/DRESSINGS) ×3 IMPLANT
GAUZE SPONGE 4X4 12PLY STRL (GAUZE/BANDAGES/DRESSINGS) ×3 IMPLANT
GLOVE BIOGEL M STRL SZ7.5 (GLOVE) ×3 IMPLANT
GOWN STRL REUS W/ TWL LRG LVL3 (GOWN DISPOSABLE) ×2 IMPLANT
GOWN STRL REUS W/TWL LRG LVL3 (GOWN DISPOSABLE) ×4
KIT RM TURNOVER STRD PROC AR (KITS) ×3 IMPLANT
LABEL OR SOLS (LABEL) ×3 IMPLANT
NDL SAFETY 22GX1.5 (NEEDLE) ×3 IMPLANT
NEEDLE HYPO 25X1 1.5 SAFETY (NEEDLE) ×3 IMPLANT
NS IRRIG 500ML POUR BTL (IV SOLUTION) ×3 IMPLANT
PACK BASIN MINOR ARMC (MISCELLANEOUS) ×3 IMPLANT
SUCTION FRAZIER HANDLE 10FR (MISCELLANEOUS) ×2
SUCTION TUBE FRAZIER 10FR DISP (MISCELLANEOUS) ×1 IMPLANT
SUPPORETR ATHLETIC LG (MISCELLANEOUS) ×1 IMPLANT
SUPPORTER ATHLETIC LG (MISCELLANEOUS) ×3
SUT CHROMIC 3 0 PS 2 (SUTURE) ×3 IMPLANT
SUT CHROMIC 3 0 SH 27 (SUTURE) ×3 IMPLANT
SUT CHROMIC 4 0 RB 1X27 (SUTURE) ×3 IMPLANT
SUT ETHILON NAB PS2 4-0 18IN (SUTURE) ×3 IMPLANT
SUT SILK 2 0 (SUTURE) ×2
SUT SILK 2-0 18XBRD TIE 12 (SUTURE) ×1 IMPLANT
SUT VIC AB 3-0 SH 27 (SUTURE)
SUT VIC AB 3-0 SH 27X BRD (SUTURE) IMPLANT
SYRINGE 10CC LL (SYRINGE) ×3 IMPLANT
WATER STERILE IRR 1000ML POUR (IV SOLUTION) ×3 IMPLANT

## 2017-03-28 NOTE — Op Note (Signed)
Preoperative diagnosis: Family planning  Postoperative diagnosis: Family planning  Procedure: Bilateral vasectomy  Surgeon: Hadley Pen, M.D.  EBL: None  Procedure: - A scalpel was used to make a 1 cm incision in his left hemiscrotum - The left vas was isolated and brought up through the incision exposing that structure. - Bleeding points were cauterized as they occurred. - The vas was free from the surrounding structures and brought into view. - A segment was positioned for placement with a hemostat. - A second hemostat was placed and a small segment between the two hemostats and was removed for inspection. - Each end of the transected vas lumen was fulgurated/obliterated using needlepoint electrocautery. It was then tied off with a #2-0 silk suture -The same procedure was performed on the right. - A suture of #3-0 chromic catgut was used to close each lateral scrotal skin incision -10 cc of half percent Sensorcaine without epinephrine was used to perform a spermatic cord block and to provide local anesthesia to the incisions  Post-Procedure: - Patient was instructed in care of the operative area - A specimen is to be delivered in 12 and 16 weeks   -Another form of contraception is to be used until he is cleared by the office.

## 2017-03-28 NOTE — Transfer of Care (Signed)
Immediate Anesthesia Transfer of Care Note  Patient: Guy Rodriguez  Procedure(s) Performed: Procedure(s): VASECTOMY (Bilateral)  Patient Location: PACU  Anesthesia Type:General  Level of Consciousness: sedated  Airway & Oxygen Therapy: Patient Spontanous Breathing and Patient connected to face mask oxygen  Post-op Assessment: Report given to RN and Post -op Vital signs reviewed and stable  Post vital signs: Reviewed and stable  Last Vitals:  Vitals:   03/28/17 1058 03/28/17 1318  BP: (!) 131/102 117/69  Pulse: 89 65  Resp: 18 (!) 6  Temp: 36.6 C (!) 36.2 C  SpO2: 97% 96%    Last Pain:  Vitals:   03/28/17 1058  TempSrc: Oral         Complications: No apparent anesthesia complications

## 2017-03-28 NOTE — H&P (Signed)
03/28/2017 9:31 AM   Guy Rodriguez 1980/10/26 [id number]  Referring provider: Cyndie Mull, DO [address] [place], [place] [address]      Chief Complaint  Patient presents with  . VAS Consult    New patient    HPI: [name] is a 36 year old Caucasian male presents today requesting a vasectomy. Unable to perform in office due to high riding testicles. Plan for vasectomy in OR under anesthesia.    PMH: Past Medical History:  Diagnosis Date  . Cocaine abuse   . ETOH abuse   . GERD (gastroesophageal reflux disease)   . History of participation in smoking cessation counseling   . Hypertension     Surgical History:      Past Surgical History:  Procedure Laterality Date  . APPENDECTOMY    . clavicale surgery Left     Home Medications:  Allergies as of 02/04/2017   No Known Allergies                 Medication List            Accurate as of 02/04/17  9:31 AM. Always use your most recent med list.           diazepam 10 MG tablet Commonly known as:  VALIUM Take one tablet 30 minutes prior to the vasectomy   gabapentin 600 MG tablet Commonly known as:  NEURONTIN Take by mouth 3 (three) times daily.   ranitidine 300 MG tablet Commonly known as:  ZANTAC Take 150 mg by mouth 2 (two) times daily.       Allergies: No Known Allergies  Family History:      Family History  Problem Relation Age of Onset  . Prostate cancer Neg Hx   . Kidney cancer Neg Hx     Social History:  reports that he has been smoking Cigarettes.  He has been smoking about 0.50 packs per day. He has never used smokeless tobacco. He reports that he does not drink alcohol or use drugs.  ROS: UROLOGY Frequent Urination?: No Hard to postpone urination?: No Burning/pain with urination?: No Get up at night to urinate?: No Leakage of urine?: No Urine stream starts and stops?: No Trouble starting stream?: No Do you have  to strain to urinate?: No Blood in urine?: No Urinary tract infection?: No Sexually transmitted disease?: No Injury to kidneys or bladder?: No Painful intercourse?: No Weak stream?: No Erection problems?: No Penile pain?: No  Gastrointestinal Nausea?: No Vomiting?: No Indigestion/heartburn?: No Diarrhea?: No Constipation?: No  Constitutional Fever: No Night sweats?: No Weight loss?: No Fatigue?: No  Skin Skin rash/lesions?: No Itching?: No  Eyes Blurred vision?: No Double vision?: No  Ears/Nose/Throat Sore throat?: No Sinus problems?: No  Hematologic/Lymphatic Swollen glands?: No Easy bruising?: No  Cardiovascular Leg swelling?: No Chest pain?: No  Respiratory Cough?: No Shortness of breath?: No  Endocrine Excessive thirst?: No  Musculoskeletal Back pain?: No Joint pain?: No  Neurological Headaches?: No Dizziness?: No  Psychologic Depression?: No Anxiety?: No  Physical Exam: BP (!) 144/97   Pulse 64   Ht  (1.854 m)   Wt 180 lb (81.6 kg)   BMI 23.75 kg/m   Constitutional: Well nourished. Alert and oriented, No acute distress. HEENT: [place] AT, moist mucus membranes. Trachea midline, no masses. Cardiovascular: No clubbing, cyanosis, or edema. RRR Respiratory: Normal respiratory effort, no increased work of breathing. Lungs clear GI: Abdomen is soft, non tender, non distended, no  abdominal masses. Liver and spleen not palpable.  No hernias appreciated.  Stool sample for occult testing is not indicated.   Skin: No rashes, bruises or suspicious lesions. Lymph: No cervical or inguinal adenopathy. Neurologic: Grossly intact, no focal deficits, moving all 4 extremities. Psychiatric: Normal mood and affect.  Laboratory Data: RecentLabs       Lab Results  Component Value Date   WBC 8.0 12/04/2016   HGB 16.6 12/04/2016   HCT 49.0 12/04/2016   MCV 92.2 12/04/2016   PLT 274 12/04/2016      RecentLabs       Lab  Results  Component Value Date   CREATININE 1.11 12/04/2016       RecentLabs       Lab Results  Component Value Date   AST 22 02/14/2016     RecentLabs       Lab Results  Component Value Date   ALT 21 02/14/2016       Assessment & Plan:    1. Family planning -vasectomy

## 2017-03-28 NOTE — Anesthesia Post-op Follow-up Note (Signed)
Anesthesia QCDR form completed.        

## 2017-03-28 NOTE — Anesthesia Procedure Notes (Signed)
Procedure Name: LMA Insertion Performed by: Omer Jack Pre-anesthesia Checklist: Patient identified, Emergency Drugs available, Suction available and Patient being monitored Patient Re-evaluated:Patient Re-evaluated prior to induction Oxygen Delivery Method: Circle system utilized Preoxygenation: Pre-oxygenation with 100% oxygen Induction Type: IV induction Ventilation: Mask ventilation without difficulty LMA: LMA inserted LMA Size: 4.5

## 2017-03-28 NOTE — Anesthesia Preprocedure Evaluation (Addendum)
Anesthesia Evaluation  Patient identified by MRN, date of birth, ID band Patient awake    Reviewed: Allergy & Precautions, NPO status , Patient's Chart, lab work & pertinent test results, reviewed documented beta blocker date and time   Airway Mallampati: II  TM Distance: >3 FB     Dental  (+) Chipped, Missing, Poor Dentition   Pulmonary Current Smoker,           Cardiovascular hypertension, Pt. on medications + Peripheral Vascular Disease       Neuro/Psych PSYCHIATRIC DISORDERS Depression    GI/Hepatic GERD  Controlled,  Endo/Other    Renal/GU      Musculoskeletal   Abdominal   Peds  Hematology   Anesthesia Other Findings Drug abuse. Pos for MJ.  Reproductive/Obstetrics                            Anesthesia Physical Anesthesia Plan  ASA: III  Anesthesia Plan: General   Post-op Pain Management:    Induction: Intravenous  PONV Risk Score and Plan:   Airway Management Planned: LMA  Additional Equipment:   Intra-op Plan:   Post-operative Plan:   Informed Consent: I have reviewed the patients History and Physical, chart, labs and discussed the procedure including the risks, benefits and alternatives for the proposed anesthesia with the patient or authorized representative who has indicated his/her understanding and acceptance.     Plan Discussed with: CRNA  Anesthesia Plan Comments:         Anesthesia Quick Evaluation

## 2017-03-29 NOTE — Anesthesia Postprocedure Evaluation (Signed)
Anesthesia Post Note  Patient: Guy Rodriguez  Procedure(s) Performed: Procedure(s) (LRB): VASECTOMY (Bilateral)  Patient location during evaluation: PACU Anesthesia Type: General Level of consciousness: awake and alert Pain management: pain level controlled Vital Signs Assessment: post-procedure vital signs reviewed and stable Respiratory status: spontaneous breathing, nonlabored ventilation, respiratory function stable and patient connected to nasal cannula oxygen Cardiovascular status: blood pressure returned to baseline and stable Postop Assessment: no apparent nausea or vomiting Anesthetic complications: no     Last Vitals:  Vitals:   03/28/17 1431 03/28/17 1456  BP: (!) 138/100 126/89  Pulse:    Resp: 14   Temp:    SpO2:      Last Pain:  Vitals:   03/28/17 1456  TempSrc:   PainSc: 4                  Angus Amini S

## 2017-03-30 ENCOUNTER — Encounter: Payer: Self-pay | Admitting: Urology

## 2017-05-09 ENCOUNTER — Encounter: Payer: Self-pay | Admitting: Emergency Medicine

## 2017-05-09 ENCOUNTER — Emergency Department
Admission: EM | Admit: 2017-05-09 | Discharge: 2017-05-10 | Disposition: A | Discharge status: Home / Self Care | Payer: Medicaid Other | Attending: Emergency Medicine | Admitting: Emergency Medicine

## 2017-05-09 ENCOUNTER — Emergency Department: Payer: Medicaid Other

## 2017-05-09 ENCOUNTER — Telehealth (INDEPENDENT_AMBULATORY_CARE_PROVIDER_SITE_OTHER): Payer: Self-pay

## 2017-05-09 DIAGNOSIS — F17210 Nicotine dependence, cigarettes, uncomplicated: Secondary | ICD-10-CM | POA: Insufficient documentation

## 2017-05-09 DIAGNOSIS — I83891 Varicose veins of right lower extremities with other complications: Secondary | ICD-10-CM

## 2017-05-09 DIAGNOSIS — M79604 Pain in right leg: Secondary | ICD-10-CM | POA: Diagnosis not present

## 2017-05-09 DIAGNOSIS — R51 Headache: Secondary | ICD-10-CM | POA: Diagnosis not present

## 2017-05-09 DIAGNOSIS — M7989 Other specified soft tissue disorders: Secondary | ICD-10-CM

## 2017-05-09 DIAGNOSIS — R519 Headache, unspecified: Secondary | ICD-10-CM

## 2017-05-09 DIAGNOSIS — Z79899 Other long term (current) drug therapy: Secondary | ICD-10-CM | POA: Insufficient documentation

## 2017-05-09 DIAGNOSIS — I10 Essential (primary) hypertension: Secondary | ICD-10-CM | POA: Diagnosis not present

## 2017-05-09 LAB — COMPREHENSIVE METABOLIC PANEL
ALT: 21 U/L (ref 17–63)
AST: 22 U/L (ref 15–41)
Albumin: 3.6 g/dL (ref 3.5–5.0)
Alkaline Phosphatase: 57 U/L (ref 38–126)
Anion gap: 5 (ref 5–15)
BUN: 23 mg/dL — ABNORMAL HIGH (ref 6–20)
CHLORIDE: 111 mmol/L (ref 101–111)
CO2: 21 mmol/L — ABNORMAL LOW (ref 22–32)
Calcium: 8.6 mg/dL — ABNORMAL LOW (ref 8.9–10.3)
Creatinine, Ser: 0.9 mg/dL (ref 0.61–1.24)
Glucose, Bld: 93 mg/dL (ref 65–99)
POTASSIUM: 4 mmol/L (ref 3.5–5.1)
Sodium: 137 mmol/L (ref 135–145)
Total Bilirubin: 0.7 mg/dL (ref 0.3–1.2)
Total Protein: 6.1 g/dL — ABNORMAL LOW (ref 6.5–8.1)

## 2017-05-09 LAB — CBC
HCT: 45.8 % (ref 40.0–52.0)
Hemoglobin: 15.7 g/dL (ref 13.0–18.0)
MCH: 32.8 pg (ref 26.0–34.0)
MCHC: 34.2 g/dL (ref 32.0–36.0)
MCV: 95.9 fL (ref 80.0–100.0)
PLATELETS: 208 10*3/uL (ref 150–440)
RBC: 4.78 MIL/uL (ref 4.40–5.90)
RDW: 12.8 % (ref 11.5–14.5)
WBC: 8.7 10*3/uL (ref 3.8–10.6)

## 2017-05-09 MED ORDER — KETOROLAC TROMETHAMINE 30 MG/ML IJ SOLN
30.0000 mg | Freq: Once | INTRAMUSCULAR | Status: AC
  Administered 2017-05-10: 30 mg via INTRAVENOUS
  Filled 2017-05-09: qty 1

## 2017-05-09 MED ORDER — PROMETHAZINE HCL 25 MG/ML IJ SOLN
12.5000 mg | Freq: Once | INTRAMUSCULAR | Status: AC
  Administered 2017-05-09: 12.5 mg via INTRAVENOUS
  Filled 2017-05-09: qty 1

## 2017-05-09 MED ORDER — SODIUM CHLORIDE 0.9 % IV BOLUS (SEPSIS)
1000.0000 mL | Freq: Once | INTRAVENOUS | Status: AC
  Administered 2017-05-09: 1000 mL via INTRAVENOUS

## 2017-05-09 NOTE — ED Notes (Signed)
RN discussed pt's presentation with Dr. Derrill KayGoodman received verbal orders for CBC, CMP, US to rt leg.

## 2017-05-09 NOTE — ED Provider Notes (Signed)
[facility] EMERGENCY DEPARTMENT Provider Note   CSN: [id number] Arrival date & time: 05/09/17  1920     History   Chief Complaint Chief Complaint  Patient presents with  . Leg Swelling    HPI Guy Rodriguez is a 36 y.o. male presents to the emergency department for multiple medical complaints.  Patient complains of chronic leg swelling.  Has a history of painful varicose veins in both legs and is in the process of getting scheduled for surgery with vascular surgeon.  Today, patient noticed increased pain and swelling in the right leg and was concerned about DVT.  Patient with prior history of DVT in the left leg and was treated with aspirin.  Currently on aspirin.  Right leg pain is 8 out of 10, his normal chronic leg pain is 6 out of 10.  Patient denies any trauma or injury.  He devices.  He has tried Tylenol, Neurontin, ibuprofen with no improvement.  Patient also with headache times 3 days, gradual onset.  Patient with history of migraine headaches but this headache is different than his normal headaches.  He describes a throbbing pain on the right parietal region that comes and goes.  He has photophobia.  Denies any vision loss.  Pain is 8 out of 10.  Has had some intermittent nausea but currently without nausea or vomiting.  He denies any fevers or neck pain.  No weakness in the upper or lower extremities.  No signs of confusion.  No relief with gabapentin, Tylenol, ibuprofen.  HPI  Past Medical History:  Diagnosis Date  . Cocaine abuse (HCC)   . ETOH abuse   . GERD (gastroesophageal reflux disease)   . History of methicillin resistant staphylococcus aureus (MRSA)   . History of participation in smoking cessation counseling   . Hypertension    H/O IN 2016-PCP TOOK PT OFF BP MED DUE TO BP CONTROLLED    Patient Active Problem List   Diagnosis Date Noted  . Leg pain 02/19/2017  . Varicose veins of leg with pain 12/26/2016  . Chronic venous insufficiency  12/26/2016  . Essential hypertension 12/26/2016  . GERD (gastroesophageal reflux disease) 12/26/2016  . Substance induced mood disorder (HCC) 02/14/2016  . Alcohol abuse 02/14/2016  . Cocaine abuse (HCC) 02/14/2016  . 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 APPENDECTOMY    . CARPAL TUNNEL RELEASE Right   . clavicale surgery Left   . VASECTOMY N/A 02/28/2017   Procedure: VASECTOMY;  Surgeon: Hildred Laser, MD;  Location: [facility] ORS;  Service: Urology;  Laterality: N/A;  . VASECTOMY Bilateral 03/28/2017   Procedure: VASECTOMY;  Surgeon: Hildred Laser, MD;  Location: [facility] ORS;  Service: Urology;  Laterality: Bilateral;       Home Medications    Prior to Admission medications   Medication Sig Start Date End Date Taking? Authorizing Provider  gabapentin (NEURONTIN) 600 MG tablet Take 600 mg by mouth 3 (three) times daily.     [provider]  oxyCODONE-acetaminophen (ROXICET) 5-325 MG tablet Take 1 tablet by mouth every 4 (four) hours as needed. 03/28/17 03/28/18  Hildred Laser, MD  ranitidine (ZANTAC) 150 MG tablet Take 150 mg by mouth 2 (two) times daily.     [provider]    Family History Family History  Problem Relation Age of Onset  . Prostate cancer Neg Hx   . Kidney cancer Neg Hx  Social History Social History  Substance Use Topics  . Smoking status: Current Every Day Smoker    Packs/day: 0.50    Years: 13.00    Types: Cigarettes  . Smokeless tobacco: Never Used  . Alcohol use Yes     Comment: OCC     Allergies   Patient has no known allergies.   Review of Systems Review of Systems  Constitutional: Negative for fatigue and fever.  HENT: Negative for congestion, sinus pain, sinus pressure and trouble swallowing.   Eyes: Positive for photophobia.  Respiratory: Negative for cough and shortness of breath.   Cardiovascular: Positive for leg swelling (right, hx of  chronic leg pain.). Negative for chest pain.  Gastrointestinal: Negative for abdominal pain, nausea and vomiting.  Genitourinary: Negative for difficulty urinating, dysuria and frequency.  Musculoskeletal: Positive for myalgias (right leg). Negative for arthralgias, back pain and neck pain.  Skin: Negative for color change and wound.  Neurological: Positive for headaches. Negative for dizziness, syncope, weakness, light-headedness and numbness.     Physical Exam Updated Vital Signs BP 127/76 (BP Location: Left Arm)   Pulse 67   Temp 98.2 F (36.8 C) (Oral)   Resp 16   Ht 6\' 1"  (1.854 m)   Wt 83.9 kg (185 lb)   SpO2 98%   BMI 24.41 kg/m   Physical Exam  Constitutional: He is oriented to person, place, and time. He appears well-developed and well-nourished.  HENT:  Head: Normocephalic and atraumatic.  Right Ear: External ear normal.  Left Ear: External ear normal.  Nose: Nose normal.  Mouth/Throat: Oropharynx is clear and moist.  Eyes: Pupils are equal, round, and reactive to light. Conjunctivae and EOM are normal. Right eye exhibits no discharge. Left eye exhibits no discharge.  Neck: Normal range of motion.  Cardiovascular: Normal rate and regular rhythm.   Pulmonary/Chest: Effort normal. No respiratory distress. He has no wheezes. He exhibits no tenderness.  Abdominal: Soft. He exhibits no distension. There is no tenderness.  Musculoskeletal: Normal range of motion.  Lymphadenopathy:    He has no cervical adenopathy.  Neurological: He is alert and oriented to person, place, and time. He has normal strength. No cranial nerve deficit or sensory deficit. He displays a negative Romberg sign. Coordination and gait normal. GCS eye subscore is 4. GCS verbal subscore is 5. GCS motor subscore is 6.  Skin: Skin is warm. No rash noted. No erythema.  Psychiatric: He has a normal mood and affect. His behavior is normal. Judgment and thought content normal.     ED Treatments / Results    Labs (all labs ordered are listed, but only abnormal results are displayed) Labs Reviewed  COMPREHENSIVE METABOLIC PANEL - Abnormal; Notable for the following:       Result Value   CO2 21 (*)    BUN 23 (*)    Calcium 8.6 (*)    Total Protein 6.1 (*)    All other components within normal limits  CBC    EKG  EKG Interpretation None       Radiology Ct Head Wo Contrast  Result Date: 05/09/2017 CLINICAL DATA:  36 year old male with acute headache. EXAM: CT HEAD WITHOUT CONTRAST TECHNIQUE: Contiguous axial images were obtained from the base of the skull through the vertex without intravenous contrast. COMPARISON:  None. FINDINGS: Brain: No evidence of acute infarction, hemorrhage, hydrocephalus, extra-axial collection or mass lesion/mass effect. Vascular: No hyperdense vessel or unexpected calcification. Skull: Normal. Negative for fracture or focal lesion. Sinuses/Orbits:  No acute finding. Other: None. IMPRESSION: Normal unenhanced CT of the brain. Electronically Signed   By: Elgie Collard M.D.   On: 05/09/2017 23:46   US Venous Img Lower Unilateral Right  Result Date: 05/09/2017 CLINICAL DATA:  36 year old male with right calf swelling x2 days. EXAM: Right LOWER EXTREMITY VENOUS DOPPLER ULTRASOUND TECHNIQUE: Gray-scale sonography with graded compression, as well as color Doppler and duplex ultrasound were performed to evaluate the lower extremity deep venous systems from the level of the common femoral vein and including the common femoral, femoral, profunda femoral, popliteal and calf veins including the posterior tibial, peroneal and gastrocnemius veins when visible. The superficial great saphenous vein was also interrogated. Spectral Doppler was utilized to evaluate flow at rest and with distal augmentation maneuvers in the common femoral, femoral and popliteal veins. COMPARISON:  None. FINDINGS: Contralateral Common Femoral Vein: Respiratory phasicity is normal and symmetric with the  symptomatic side. No evidence of thrombus. Normal compressibility. Common Femoral Vein: No evidence of thrombus. Normal compressibility, respiratory phasicity and response to augmentation. Saphenofemoral Junction: No evidence of thrombus. Normal compressibility and flow on color Doppler imaging. Profunda Femoral Vein: No evidence of thrombus. Normal compressibility and flow on color Doppler imaging. Femoral Vein: No evidence of thrombus. Normal compressibility, respiratory phasicity and response to augmentation. Popliteal Vein: No evidence of thrombus. Normal compressibility, respiratory phasicity and response to augmentation. Calf Veins: No evidence of thrombus. Normal compressibility and flow on color Doppler imaging. Superficial Great Saphenous Vein: No evidence of thrombus. Normal compressibility. Venous Reflux:  None. Other Findings: Dilated and prominent varicose vein in the calf over the area of pain and swelling. No clot noted in the visualized portion of the varicose vein. IMPRESSION: No evidence of deep venous thrombosis. Superficial varicose vein in the calf corresponding to the area of pain and swelling. Electronically Signed   By: Elgie Collard M.D.   On: 05/09/2017 21:23    Procedures Procedures (including critical care time)  Medications Ordered in ED Medications  promethazine (PHENERGAN) injection 12.5 mg (12.5 mg Intravenous Given 05/09/17 2331)  sodium chloride 0.9 % bolus 1,000 mL (1,000 mLs Intravenous New Bag/Given 05/09/17 2332)  ketorolac (TORADOL) 30 MG/ML injection 30 mg (30 mg Intravenous Given 05/10/17 0011)     Initial Impression / Assessment and Plan / ED Course  I have reviewed the triage vital signs and the nursing notes.  Pertinent labs & imaging results that were available during my care of the patient were reviewed by me and considered in my medical decision making (see chart for details).     36 year old male with headache and right leg pain.  Vital signs  within normal limits, patient afebrile.  CT of the head was negative.  Headache was improved with Toradol, Phenergan and IV fluids.  Patient with chronic bilateral leg pain due to painful varicose veins.  Ultrasound of the right leg today was negative for DVT.  He is encouraged to keep lower extremities elevated, encourage compression stockings and follow-up with vascular surgeon.  He is educated on signs and symptoms to return to the ED for.  Final Clinical Impressions(s) / ED Diagnoses   Final diagnoses:  Right leg pain  Symptomatic varicose veins, right  Acute nonintractable headache, unspecified headache type    New Prescriptions New Prescriptions   No medications on file     Ronnette Juniper 05/10/17 0029    Nita Sickle, MD 05/12/17 [phone]

## 2017-05-09 NOTE — ED Triage Notes (Signed)
Pt reports swelling to right calf, started 2 days ago painful to walk, Pt talks in complete sentences no respiratory distress noted

## 2017-05-10 MED ORDER — HYDROCODONE-ACETAMINOPHEN 5-325 MG PO TABS: 1.0000 | ORAL_TABLET | Freq: Four times a day (QID) | ORAL | 0 refills | Status: DC | PRN

## 2017-05-10 NOTE — Discharge Instructions (Signed)
Please continue with Tylenol and ibuprofen as needed today.  He may take Norco as needed for severe headache or leg pain.  If no improvement of symptoms in 2-3 days, follow-up with vascular doctor or primary care physician.  If any worsening symptoms or changes in health return to the emergency department.

## 2017-05-22 ENCOUNTER — Telehealth: Payer: Self-pay

## 2017-05-22 NOTE — Telephone Encounter (Signed)
Pt called and left a message on the triage line that he is having pain in testicles after having a vasectomy. Attempted to return pt call that was unsuccessful. LMOM

## 2017-05-27 ENCOUNTER — Ambulatory Visit (INDEPENDENT_AMBULATORY_CARE_PROVIDER_SITE_OTHER): Payer: Medicaid Other | Admitting: Vascular Surgery

## 2017-06-13 ENCOUNTER — Emergency Department: Payer: Medicaid Other

## 2017-06-13 ENCOUNTER — Encounter: Payer: Self-pay | Admitting: Emergency Medicine

## 2017-06-13 ENCOUNTER — Emergency Department
Admission: EM | Admit: 2017-06-13 | Discharge: 2017-06-13 | Disposition: A | Discharge status: Home / Self Care | Payer: Medicaid Other | Attending: Emergency Medicine | Admitting: Emergency Medicine

## 2017-06-13 ENCOUNTER — Other Ambulatory Visit: Payer: Self-pay

## 2017-06-13 DIAGNOSIS — N50811 Right testicular pain: Secondary | ICD-10-CM

## 2017-06-13 DIAGNOSIS — F17210 Nicotine dependence, cigarettes, uncomplicated: Secondary | ICD-10-CM | POA: Insufficient documentation

## 2017-06-13 DIAGNOSIS — Z9889 Other specified postprocedural states: Secondary | ICD-10-CM | POA: Diagnosis not present

## 2017-06-13 DIAGNOSIS — I10 Essential (primary) hypertension: Secondary | ICD-10-CM | POA: Insufficient documentation

## 2017-06-13 DIAGNOSIS — N5089 Other specified disorders of the male genital organs: Secondary | ICD-10-CM

## 2017-06-13 DIAGNOSIS — N538 Other male sexual dysfunction: Secondary | ICD-10-CM | POA: Diagnosis not present

## 2017-06-13 DIAGNOSIS — F526 Dyspareunia not due to a substance or known physiological condition: Secondary | ICD-10-CM

## 2017-06-13 LAB — CBC WITH DIFFERENTIAL/PLATELET
BASOS ABS: 0.1 10*3/uL (ref 0–0.1)
Basophils Relative: 1 %
Eosinophils Absolute: 0.3 10*3/uL (ref 0–0.7)
Eosinophils Relative: 5 %
HEMATOCRIT: 44.6 % (ref 40.0–52.0)
HEMOGLOBIN: 15.4 g/dL (ref 13.0–18.0)
LYMPHS PCT: 31 %
Lymphs Abs: 2.1 10*3/uL (ref 1.0–3.6)
MCH: 33.1 pg (ref 26.0–34.0)
MCHC: 34.5 g/dL (ref 32.0–36.0)
MCV: 95.8 fL (ref 80.0–100.0)
MONO ABS: 0.5 10*3/uL (ref 0.2–1.0)
MONOS PCT: 8 %
NEUTROS ABS: 3.8 10*3/uL (ref 1.4–6.5)
NEUTROS PCT: 55 %
Platelets: 215 10*3/uL (ref 150–440)
RBC: 4.66 MIL/uL (ref 4.40–5.90)
RDW: 13.2 % (ref 11.5–14.5)
WBC: 6.8 10*3/uL (ref 3.8–10.6)

## 2017-06-13 LAB — BASIC METABOLIC PANEL
ANION GAP: 7 (ref 5–15)
BUN: 16 mg/dL (ref 6–20)
CALCIUM: 8.6 mg/dL — AB (ref 8.9–10.3)
CHLORIDE: 106 mmol/L (ref 101–111)
CO2: 26 mmol/L (ref 22–32)
Creatinine, Ser: 0.88 mg/dL (ref 0.61–1.24)
GFR calc Af Amer: >60 mL/min (ref >60)
GFR calc non Af Amer: >60 mL/min (ref >60)
GLUCOSE: 93 mg/dL (ref 65–99)
POTASSIUM: 4.1 mmol/L (ref 3.5–5.1)
Sodium: 139 mmol/L (ref 135–145)

## 2017-06-13 LAB — URINALYSIS, COMPLETE (UACMP) WITH MICROSCOPIC
Bacteria, UA: NONE SEEN
Bilirubin Urine: NEGATIVE
GLUCOSE, UA: NEGATIVE mg/dL
Hgb urine dipstick: NEGATIVE
Ketones, ur: NEGATIVE mg/dL
Leukocytes, UA: NEGATIVE
Nitrite: NEGATIVE
PH: 8 (ref 5.0–8.0)
PROTEIN: NEGATIVE mg/dL
SQUAMOUS EPITHELIAL / LPF: NONE SEEN
Specific Gravity, Urine: 1.001 — ABNORMAL LOW (ref 1.005–1.030)

## 2017-06-13 MED ORDER — IBUPROFEN 400 MG PO TABS: 600.0000 mg | ORAL_TABLET | Freq: Once | ORAL | Status: DC

## 2017-06-13 MED ORDER — IBUPROFEN 800 MG PO TABS
800.0000 mg | ORAL_TABLET | Freq: Once | ORAL | Status: AC
  Administered 2017-06-13: 800 mg via ORAL

## 2017-06-13 MED ORDER — IBUPROFEN 400 MG PO TABS
ORAL_TABLET | ORAL | Status: AC
  Filled 2017-06-13: qty 2

## 2017-06-13 NOTE — ED Notes (Signed)
NAD noted at time of D/C. Pt denies questions or concerns. Pt ambulatory to the lobby at this time.  

## 2017-06-13 NOTE — ED Provider Notes (Signed)
[facility] Emergency Department Provider Note  ____________________________________________  Time seen: Approximately 3:31 PM  I have reviewed the triage vital signs and the nursing notes.   HISTORY  Chief Complaint Post-op Problem    HPI Guy Rodriguez is a 36 y.o. male is post vasectomy in 9/18 presenting with right greater than left testicular pain.  The patient reports that since his surgery, "it has not felt right."  He describes pain in the testicles each time he has sexual intercourse.  Since yesterday, the patient has noted pain in the posterior right testicle with a palpable "knot."  He denies any discharge from the penis.  He does have occasionally have radiation of pain into the right lower quadrant.  He denies any dysuria, hematuria, fevers or chills.  No nausea or vomiting.  Past Medical History:  Diagnosis Date  . Cocaine abuse (HCC)   . ETOH abuse   . GERD (gastroesophageal reflux disease)   . History of methicillin resistant staphylococcus aureus (MRSA)   . History of participation in smoking cessation counseling   . Hypertension    H/O IN 2016-PCP TOOK PT OFF BP MED DUE TO BP CONTROLLED    Patient Active Problem List   Diagnosis Date Noted  . Leg pain 02/19/2017  . Varicose veins of leg with pain 12/26/2016  . Chronic venous insufficiency 12/26/2016  . Essential hypertension 12/26/2016  . GERD (gastroesophageal reflux disease) 12/26/2016  . Substance induced mood disorder (HCC) 02/14/2016  . Alcohol abuse 02/14/2016  . Cocaine abuse (HCC) 02/14/2016  . 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 APPENDECTOMY    . CARPAL TUNNEL RELEASE Right   . clavicale surgery Left   . VASECTOMY N/A 02/28/2017   Procedure: VASECTOMY;  Surgeon: Hildred Laser, MD;  Location: [facility] ORS;  Service: Urology;  Laterality: N/A;  . VASECTOMY Bilateral 03/28/2017   Procedure: VASECTOMY;   Surgeon: Hildred Laser, MD;  Location: [facility] ORS;  Service: Urology;  Laterality: Bilateral;    Current Outpatient Rx  . Order #: [id number] Class: Historical Med  . Order #: [id number] Class: Print  . Order #: [id number] Class: Historical Med    Allergies Patient has no known allergies.  Family History  Problem Relation Age of Onset  . Prostate cancer Neg Hx   . Kidney cancer Neg Hx     Social History Social History   Tobacco Use  . Smoking status: Current Every Day Smoker    Packs/day: 0.50    Years: 13.00    Pack years: 6.50    Types: Cigarettes  . Smokeless tobacco: Never Used  Substance Use Topics  . Alcohol use: Yes    Comment: OCC  . Drug use: Yes    Types: Marijuana    Comment: + UDS FOR COCAINE AND MARIJUANA IN 2017, smoked marijuana 02/27/17, last did cocaine 02/24/17 or 02/23/17 per patient    Review of Systems Constitutional: No fever/chills.  No lightheadedness or syncope. Eyes: No visual changes. ENT: No sore throat. No congestion or rhinorrhea. Cardiovascular: Denies chest pain. Denies palpitations. Respiratory: Denies shortness of breath.  No cough. Gastrointestinal: No abdominal pain.  No nausea, no vomiting.  No diarrhea.  No constipation. Genitourinary: Negative for dysuria.  No urinary frequency.  Positive right greater than left testicular pain.  Positive palpable mass in the right testicle. Musculoskeletal: Negative for back pain. Skin: Negative for rash. Neurological: Negative for headaches. No focal numbness, tingling or weakness.  ____________________________________________   PHYSICAL EXAM:  VITAL SIGNS: ED Triage Vitals  Enc Vitals Group     BP 06/13/17 1153 (!) 136/92     Pulse Rate 06/13/17 1153 72     Resp 06/13/17 1153 16     Temp 06/13/17 1153 97.9 F (36.6 C)     Temp Source 06/13/17 1153 Oral     SpO2 06/13/17 1153 96 %     Weight 06/13/17 1154 200 lb (90.7 kg)     Height 06/13/17 1154 6\' 1"  (1.854 m)     Head  Circumference --      Peak Flow --      Pain Score 06/13/17 1153 8     Pain Loc --      Pain Edu? --      Excl. in GC? --     Constitutional: Alert and oriented. Well appearing and in no acute distress. Answers questions appropriately. Eyes: Conjunctivae are normal.  EOMI. No scleral icterus. Head: Atraumatic. Nose: No congestion/rhinnorhea. Mouth/Throat: Mucous membranes are moist.  Neck: No stridor.  Supple.   Cardiovascular: Normal rate, regular rhythm. No murmurs, rubs or gallops.  Respiratory: Normal respiratory effort.  No accessory muscle use or retractions. Lungs CTAB.  No wheezes, rales or ronchi. Gastrointestinal: Soft, and nondistended.  Minimal ttp in the RLQ. No guarding or rebound.  No peritoneal signs. Genitourinary: Normal appearing circumcised penis without any lesions or discharge.  The patient has a normal lie of his testicles.  His scrotal sac has no abnormal skin findings.  He has mild tenderness to palpation in the posterior and caudal aspect of the right testicle without any palpable masses.  No pain with palpation of the left testicle.  No masses on the left testicle.  No palpable inguinal hernias bilaterally. Musculoskeletal: No LE edema.  Neurologic:  A&Ox3.  Speech is clear.  Face and smile are symmetric.  EOMI.  Moves all extremities well. Skin:  Skin is warm, dry and intact. No rash noted. Psychiatric: Mood and affect are normal. Speech and behavior are normal.  Normal judgement.  ____________________________________________   LABS (all labs ordered are listed, but only abnormal results are displayed)  Labs Reviewed  URINALYSIS, COMPLETE (UACMP) WITH MICROSCOPIC - Abnormal; Notable for the following components:      Result Value   Color, Urine COLORLESS (*)    APPearance CLEAR (*)    Specific Gravity, Urine 1.001 (*)    All other components within normal limits  BASIC METABOLIC PANEL - Abnormal; Notable for the following components:   Calcium 8.6 (*)     All other components within normal limits  CBC WITH DIFFERENTIAL/PLATELET   ____________________________________________  EKG  Not indicated ____________________________________________  RADIOLOGY  [place] Scrotum  Result Date: 06/13/2017 CLINICAL DATA:  36 year old male with right side testicle pain and swelling following vasectomy in September. EXAM: SCROTAL ULTRASOUND DOPPLER ULTRASOUND OF THE TESTICLES TECHNIQUE: Complete ultrasound examination of the testicles, epididymis, and other scrotal structures was performed. Color and spectral Doppler ultrasound were also utilized to evaluate blood flow to the testicles. COMPARISON:  None. FINDINGS: Right testicle Measurements: 4.2 x 2.4 x 3.0 cm. No mass or microlithiasis visualized. Left testicle Measurements: 4.0 x 2.2 x 2.9 cm. No mass or microlithiasis visualized. Right epididymis: The tail of the right epididymis appears larger than the left (image 48 on the right versus image 54 on the left) but does not appear hypervascular on Doppler evaluation (image 52). Left epididymis:  Within normal limits. Hydrocele:  None visualized. Varicocele:  None visualized. Pulsed Doppler interrogation of both testes demonstrates normal low resistance arterial and venous waveforms bilaterally. IMPRESSION: Negative. No evidence of testicular torsion and no convincing scrotal abnormality. Electronically Signed   By: Odessa FlemingH  Hall M.D.   On: 06/13/2017 15:20   [place] Abdominal Pelvic Art/vent Flow Doppler  Result Date: 06/13/2017 CLINICAL DATA:  36 year old male with right side testicle pain and swelling following vasectomy in September. EXAM: SCROTAL ULTRASOUND DOPPLER ULTRASOUND OF THE TESTICLES TECHNIQUE: Complete ultrasound examination of the testicles, epididymis, and other scrotal structures was performed. Color and spectral Doppler ultrasound were also utilized to evaluate blood flow to the testicles. COMPARISON:  None. FINDINGS: Right testicle Measurements: 4.2 x 2.4 x  3.0 cm. No mass or microlithiasis visualized. Left testicle Measurements: 4.0 x 2.2 x 2.9 cm. No mass or microlithiasis visualized. Right epididymis: The tail of the right epididymis appears larger than the left (image 48 on the right versus image 54 on the left) but does not appear hypervascular on Doppler evaluation (image 52). Left epididymis:  Within normal limits. Hydrocele:  None visualized. Varicocele:  None visualized. Pulsed Doppler interrogation of both testes demonstrates normal low resistance arterial and venous waveforms bilaterally. IMPRESSION: Negative. No evidence of testicular torsion and no convincing scrotal abnormality. Electronically Signed   By: Odessa FlemingH  Hall M.D.   On: 06/13/2017 15:20    ____________________________________________   PROCEDURES  Procedure(s) performed: None  Procedures  Critical Care performed: No ____________________________________________   INITIAL IMPRESSION / ASSESSMENT AND PLAN / ED COURSE  Pertinent labs & imaging results that were available during my care of the patient were reviewed by me and considered in my medical decision making (see chart for details).  36 y.o. male status post vasectomy in September presenting with pain with intercourse since his vasectomy, as well as new right testicular pain and mass.  Overall, the patient is hemodynamically stable and afebrile.  He does have some tenderness on his examination, but no palpable masses, and his ultrasound does not show any acute abnormalities.  He has no evidence of torsion, hydrocele or infection in the testicles or scrotum.  His urinalysis does not show infection.  His white blood cell count is normal.  He has no systemic symptoms.  Overall, the patient has been having improvement with icing his testicle, so have asked him to continue that and alternate Tylenol Motrin for his pain.  He will follow-up with his urologist.  Return precautions and follow-up instructions were  discussed.  ____________________________________________  FINAL CLINICAL IMPRESSION(S) / ED DIAGNOSES  Final diagnoses:  Right testicular pain  Sexual pain disorder         NEW MEDICATIONS STARTED DURING THIS VISIT:  This SmartLink is deprecated. Use AVSMEDLIST instead to display the medication list for a patient.    Rockne MenghiniNorman, Anne-Caroline, MD 06/13/17 [phone]

## 2017-06-13 NOTE — ED Triage Notes (Signed)
Pt to the ED for right testicle swelling that started yesterday. Pt states that he had a vasecomy at the end of September, pt denies any complications up to this point except mild soreness. Pt states that last night he noticed swelling and today the area is very painful and the pain goes up into his lower abdomen. Pt is in NAD at this time.

## 2017-06-13 NOTE — Discharge Instructions (Signed)
Please continue to ice your testicle for 10-15 minutes every 2-3 hours.  You may alternate Tylenol or Motrin for your pain.  Please make an appointment with your urologist for follow-up.  Return to the emergency department if you develop severe pain, nausea or vomiting, fever, or any other symptoms concerning to you.

## 2017-07-21 ENCOUNTER — Ambulatory Visit (INDEPENDENT_AMBULATORY_CARE_PROVIDER_SITE_OTHER): Payer: Medicaid Other | Admitting: Vascular Surgery

## 2017-07-30 ENCOUNTER — Ambulatory Visit (INDEPENDENT_AMBULATORY_CARE_PROVIDER_SITE_OTHER): Payer: Medicaid Other | Admitting: Vascular Surgery

## 2017-08-04 ENCOUNTER — Other Ambulatory Visit: Payer: Self-pay

## 2017-08-04 ENCOUNTER — Encounter: Payer: Self-pay | Admitting: Emergency Medicine

## 2017-08-04 ENCOUNTER — Emergency Department: Payer: Medicaid Other

## 2017-08-04 ENCOUNTER — Emergency Department
Admission: EM | Admit: 2017-08-04 | Discharge: 2017-08-04 | Disposition: A | Discharge status: Home / Self Care | Payer: Medicaid Other | Attending: Emergency Medicine | Admitting: Emergency Medicine

## 2017-08-04 DIAGNOSIS — Y998 Other external cause status: Secondary | ICD-10-CM | POA: Insufficient documentation

## 2017-08-04 DIAGNOSIS — F17210 Nicotine dependence, cigarettes, uncomplicated: Secondary | ICD-10-CM | POA: Diagnosis not present

## 2017-08-04 DIAGNOSIS — F1410 Cocaine abuse, uncomplicated: Secondary | ICD-10-CM | POA: Diagnosis not present

## 2017-08-04 DIAGNOSIS — S20211A Contusion of right front wall of thorax, initial encounter: Secondary | ICD-10-CM | POA: Insufficient documentation

## 2017-08-04 DIAGNOSIS — Y92009 Unspecified place in unspecified non-institutional (private) residence as the place of occurrence of the external cause: Secondary | ICD-10-CM | POA: Diagnosis not present

## 2017-08-04 DIAGNOSIS — I10 Essential (primary) hypertension: Secondary | ICD-10-CM | POA: Insufficient documentation

## 2017-08-04 DIAGNOSIS — Y9389 Activity, other specified: Secondary | ICD-10-CM | POA: Diagnosis not present

## 2017-08-04 DIAGNOSIS — F329 Major depressive disorder, single episode, unspecified: Secondary | ICD-10-CM | POA: Diagnosis not present

## 2017-08-04 DIAGNOSIS — S299XXA Unspecified injury of thorax, initial encounter: Secondary | ICD-10-CM | POA: Diagnosis present

## 2017-08-04 DIAGNOSIS — W108XXA Fall (on) (from) other stairs and steps, initial encounter: Secondary | ICD-10-CM | POA: Diagnosis not present

## 2017-08-04 DIAGNOSIS — Z79899 Other long term (current) drug therapy: Secondary | ICD-10-CM | POA: Insufficient documentation

## 2017-08-04 MED ORDER — NAPROXEN 500 MG PO TABS: 500.0000 mg | ORAL_TABLET | Freq: Two times a day (BID) | ORAL | 0 refills | Status: DC

## 2017-08-04 MED ORDER — OXYCODONE-ACETAMINOPHEN 5-325 MG PO TABS
1.0000 | ORAL_TABLET | Freq: Once | ORAL | Status: AC
  Administered 2017-08-04: 1 via ORAL
  Filled 2017-08-04: qty 1

## 2017-08-04 MED ORDER — HYDROCODONE-ACETAMINOPHEN 5-325 MG PO TABS: 1.0000 | ORAL_TABLET | Freq: Four times a day (QID) | ORAL | 0 refills | Status: DC | PRN

## 2017-08-04 NOTE — ED Triage Notes (Signed)
Pt tripped over kids toy and fell down 4 or 5 stairs. Did not hit head. Impact to right ribs, pain.

## 2017-08-04 NOTE — ED Provider Notes (Signed)
[facility] Emergency Department Provider Note  ____________________________________________   First MD Initiated Contact with Patient 08/04/17 [phone]     (approximate)  I have reviewed the triage vital signs and the nursing notes.   HISTORY  Chief Complaint Fall   HPI Guy Rodriguez is a 37 y.o. male is here with complaint of right-sided rib pain.  Patient states that he fell due to tripping on his children's toys causing him to fall approximately 4-5 steps.  Patient denies any head injury and states he landed directly on the right ribs.  Has increased pain with deep inspiration.  He has not taken any over-the-counter medication prior to his arrival.  He rates his pain as 9/10.   Past Medical History:  Diagnosis Date  . Cocaine abuse (HCC)   . ETOH abuse   . GERD (gastroesophageal reflux disease)   . History of methicillin resistant staphylococcus aureus (MRSA)   . History of participation in smoking cessation counseling   . Hypertension    H/O IN 2016-PCP TOOK PT OFF BP MED DUE TO BP CONTROLLED    Patient Active Problem List   Diagnosis Date Noted  . Leg pain 02/19/2017  . Varicose veins of leg with pain 12/26/2016  . Chronic venous insufficiency 12/26/2016  . Essential hypertension 12/26/2016  . GERD (gastroesophageal reflux disease) 12/26/2016  . Substance induced mood disorder (HCC) 02/14/2016  . Alcohol abuse 02/14/2016  . Cocaine abuse (HCC) 02/14/2016  . 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 APPENDECTOMY    . CARPAL TUNNEL RELEASE Right   . clavicale surgery Left   . VASECTOMY N/A 02/28/2017   Procedure: VASECTOMY;  Surgeon: Hildred Laser, MD;  Location: [facility] ORS;  Service: Urology;  Laterality: N/A;  . VASECTOMY Bilateral 03/28/2017   Procedure: VASECTOMY;  Surgeon: Hildred Laser, MD;  Location: [facility] ORS;  Service: Urology;  Laterality: Bilateral;    Prior to  Admission medications   Medication Sig Start Date End Date Taking? Authorizing Provider  gabapentin (NEURONTIN) 600 MG tablet Take 600 mg by mouth 3 (three) times daily.     [provider]  HYDROcodone-acetaminophen (NORCO) 5-325 MG tablet Take 1 tablet by mouth every 6 (six) hours as needed for moderate pain. 08/04/17   Tommi Rumps, PA-C  naproxen (NAPROSYN) 500 MG tablet Take 1 tablet (500 mg total) by mouth 2 (two) times daily with a meal. 08/04/17   Tommi Rumps, PA-C  ranitidine (ZANTAC) 150 MG tablet Take 150 mg by mouth 2 (two) times daily.     [provider]    Allergies Patient has no known allergies.  Family History  Problem Relation Age of Onset  . Prostate cancer Neg Hx   . Kidney cancer Neg Hx     Social History Social History   Tobacco Use  . Smoking status: Current Every Day Smoker    Packs/day: 0.50    Years: 13.00    Pack years: 6.50    Types: Cigarettes  . Smokeless tobacco: Never Used  Substance Use Topics  . Alcohol use: Yes    Comment: OCC  . Drug use: Yes    Types: Marijuana    Comment: + UDS FOR COCAINE AND MARIJUANA IN 2017, smoked marijuana 02/27/17, last did cocaine 02/24/17 or 02/23/17 per patient    Review of Systems Constitutional: No fever/chills Eyes: No visual changes. ENT: Trauma Cardiovascular: Denies chest pain. Respiratory: Denies  shortness of breath. Gastrointestinal: No abdominal pain.  No nausea, no vomiting.  Musculoskeletal: Positive for right rib pain. Skin: Negative for abrasion or laceration. Neurological: Negative for headaches, focal weakness or numbness. ____________________________________________   PHYSICAL EXAM:  VITAL SIGNS: ED Triage Vitals  Enc Vitals Group     BP 08/04/17 0813 102/68     Pulse Rate 08/04/17 0810 65     Resp 08/04/17 0810 18     Temp 08/04/17 0810 97.8 F (36.6 C)     Temp Source 08/04/17 0810 Oral     SpO2 08/04/17 0810 99 %     Weight 08/04/17 0809 200 lb (90.7  kg)     Height 08/04/17 0809 6\' 1"  (1.854 m)     Head Circumference --      Peak Flow --      Pain Score 08/04/17 0809 9     Pain Loc --      Pain Edu? --      Excl. in GC? --    Constitutional: Alert and oriented. Well appearing and in no acute distress. Eyes: Conjunctivae are normal.  Head: Atraumatic. Nose: No trauma. Neck: No stridor.  Nontender cervical spine to palpation posteriorly.  Range of motion is without restriction. Cardiovascular: Normal rate, regular rhythm. Grossly normal heart sounds.  Good peripheral circulation. Respiratory: Normal respiratory effort.  No retractions. Lungs CTAB.  On examination of the right ribs there is no gross deformity, soft tissue swelling, or abrasions present.  There is marked tenderness on palpation of the ninth and 10th rib area. Gastrointestinal: Soft and nontender. No distention.  No CVA tenderness. Musculoskeletal: Nontender thoracic or lumbar spine.  Nontender to palpation upper and lower extremities.  Patient is able to walk without assistance. Neurologic:  Normal speech and language. No gross focal neurologic deficits are appreciated. No gait instability. Skin:  Skin is warm, dry and intact.  No ecchymosis, erythema, or abrasions noted. Psychiatric: Mood and affect are normal. Speech and behavior are normal.  ____________________________________________   LABS (all labs ordered are listed, but only abnormal results are displayed)  Labs Reviewed - No data to display  RADIOLOGY  Dg Ribs Unilateral W/chest Right  Result Date: 08/04/2017 CLINICAL DATA:  Tripped and fell down stairs this morning. Right rib pain. Initial encounter. EXAM: RIGHT RIBS AND CHEST - 3+ VIEW COMPARISON:  Chest radiographs 12/04/2016 FINDINGS: The cardiomediastinal silhouette is within normal limits. The lungs are clear. No pleural effusion or pneumothorax is identified. No rib fracture is identified. IMPRESSION: Negative. Electronically Signed   By: Sebastian AcheAllen   Grady M.D.   On: 08/04/2017 08:42    ____________________________________________   PROCEDURES  Procedure(s) performed: None  Procedures  Critical Care performed: No  ____________________________________________   INITIAL IMPRESSION / ASSESSMENT AND PLAN / ED COURSE Patient was made aware that he does not have a fracture to his ribs.  Patient was given Percocet while in the ED since he was brought in by ambulance.  He was given a prescription for naproxen 500 mg twice daily with food and Norco every 6 hours as needed for moderate to severe pain.  He is instructed to use ice to the area as needed for pain and swelling.  He is to follow-up with his PCP if any continued problems.  ____________________________________________   FINAL CLINICAL IMPRESSION(S) / ED DIAGNOSES  Final diagnoses:  Rib contusion, right, initial encounter     ED Discharge Orders        Ordered  HYDROcodone-acetaminophen (NORCO) 5-325 MG tablet  Every 6 hours PRN,   Status:  Discontinued     08/04/17 0912    HYDROcodone-acetaminophen (NORCO) 5-325 MG tablet  Every 6 hours PRN     08/04/17 0916    naproxen (NAPROSYN) 500 MG tablet  2 times daily with meals     08/04/17 0916       Note:  This document was prepared using Dragon voice recognition software and may include unintentional dictation errors.    Tommi Rumps, PA-C 08/04/17 1039    Emily Filbert, MD 08/04/17 1055

## 2017-08-04 NOTE — ED Notes (Signed)
See triage note  States he fell down steps this am when taking the dog out  Landed on right side  Having increased pain to right lateral rib area

## 2017-08-04 NOTE — Discharge Instructions (Signed)
Follow-up with your primary care provider if any continued problems  Apply ice to your ribs for pain and swelling.  Take pain medication only as directed.  Take naproxen 500 mg twice daily with food.  Do not drive while taking pain medication.  You also need to follow-up with your primary care if extra days from work is needed or continued pain medication.

## 2017-08-11 ENCOUNTER — Encounter: Payer: Self-pay | Admitting: Emergency Medicine

## 2017-08-11 ENCOUNTER — Other Ambulatory Visit: Payer: Self-pay

## 2017-08-11 ENCOUNTER — Emergency Department: Payer: Medicaid Other

## 2017-08-11 ENCOUNTER — Emergency Department
Admission: EM | Admit: 2017-08-11 | Discharge: 2017-08-11 | Disposition: A | Discharge status: Home / Self Care | Payer: Medicaid Other | Attending: Emergency Medicine | Admitting: Emergency Medicine

## 2017-08-11 DIAGNOSIS — W010XXA Fall on same level from slipping, tripping and stumbling without subsequent striking against object, initial encounter: Secondary | ICD-10-CM | POA: Diagnosis not present

## 2017-08-11 DIAGNOSIS — Z79899 Other long term (current) drug therapy: Secondary | ICD-10-CM | POA: Insufficient documentation

## 2017-08-11 DIAGNOSIS — Y929 Unspecified place or not applicable: Secondary | ICD-10-CM | POA: Diagnosis not present

## 2017-08-11 DIAGNOSIS — F329 Major depressive disorder, single episode, unspecified: Secondary | ICD-10-CM | POA: Insufficient documentation

## 2017-08-11 DIAGNOSIS — F1410 Cocaine abuse, uncomplicated: Secondary | ICD-10-CM | POA: Diagnosis not present

## 2017-08-11 DIAGNOSIS — Y9389 Activity, other specified: Secondary | ICD-10-CM | POA: Insufficient documentation

## 2017-08-11 DIAGNOSIS — Y998 Other external cause status: Secondary | ICD-10-CM | POA: Insufficient documentation

## 2017-08-11 DIAGNOSIS — F17210 Nicotine dependence, cigarettes, uncomplicated: Secondary | ICD-10-CM | POA: Diagnosis not present

## 2017-08-11 DIAGNOSIS — I10 Essential (primary) hypertension: Secondary | ICD-10-CM | POA: Insufficient documentation

## 2017-08-11 DIAGNOSIS — S4991XA Unspecified injury of right shoulder and upper arm, initial encounter: Secondary | ICD-10-CM | POA: Insufficient documentation

## 2017-08-11 MED ORDER — MELOXICAM 15 MG PO TABS: 15.0000 mg | ORAL_TABLET | Freq: Every day | ORAL | 0 refills | Status: AC

## 2017-08-11 NOTE — ED Triage Notes (Signed)
Seen last week after a fall  ,  right shoulder  with increasing pain.

## 2017-08-11 NOTE — ED Notes (Signed)
Here last week, right shoulder pain

## 2017-08-11 NOTE — ED Provider Notes (Signed)
[facility] Emergency Department Provider Note  ____________________________________________  Time seen: Approximately 1:18 PM  I have reviewed the triage vital signs and the nursing notes.   HISTORY  Chief Complaint Shoulder Pain    HPI Guy Rodriguez is a 37 y.o. male that presents to the emergency department for evaluation of shoulder pain after a fall 1 week ago. Initially, he was having pain in his ribs, which has improved. Pain in his shoulder has stayed constant.  Pain is primarily over the top of his shoulder.  He can move his shoulder but with pain in all directions.  He is having numbness along the back of his shoulder and arm. Pain is a 7/10.  No new injury.  No headache, neck pain, tingling, nausea, vomiting.   Past Medical History:  Diagnosis Date  . Cocaine abuse (HCC)   . ETOH abuse   . GERD (gastroesophageal reflux disease)   . History of methicillin resistant staphylococcus aureus (MRSA)   . History of participation in smoking cessation counseling   . Hypertension    H/O IN 2016-PCP TOOK PT OFF BP MED DUE TO BP CONTROLLED    Patient Active Problem List   Diagnosis Date Noted  . Leg pain 02/19/2017  . Varicose veins of leg with pain 12/26/2016  . Chronic venous insufficiency 12/26/2016  . Essential hypertension 12/26/2016  . GERD (gastroesophageal reflux disease) 12/26/2016  . Substance induced mood disorder (HCC) 02/14/2016  . Alcohol abuse 02/14/2016  . Cocaine abuse (HCC) 02/14/2016  . 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 APPENDECTOMY    . CARPAL TUNNEL RELEASE Right   . clavicale surgery Left   . VASECTOMY N/A 02/28/2017   Procedure: VASECTOMY;  Surgeon: Hildred Laser, MD;  Location: [facility] ORS;  Service: Urology;  Laterality: N/A;  . VASECTOMY Bilateral 03/28/2017   Procedure: VASECTOMY;  Surgeon: Hildred Laser, MD;  Location: [facility] ORS;  Service:  Urology;  Laterality: Bilateral;    Prior to Admission medications   Medication Sig Start Date End Date Taking? Authorizing Provider  gabapentin (NEURONTIN) 600 MG tablet Take 600 mg by mouth 3 (three) times daily.     [provider]  HYDROcodone-acetaminophen (NORCO) 5-325 MG tablet Take 1 tablet by mouth every 6 (six) hours as needed for moderate pain. 08/04/17   Tommi Rumps, PA-C  meloxicam (MOBIC) 15 MG tablet Take 1 tablet (15 mg total) by mouth daily for 10 days. 08/11/17 08/21/17  Enid Derry, PA-C  naproxen (NAPROSYN) 500 MG tablet Take 1 tablet (500 mg total) by mouth 2 (two) times daily with a meal. 08/04/17   Tommi Rumps, PA-C  ranitidine (ZANTAC) 150 MG tablet Take 150 mg by mouth 2 (two) times daily.     [provider]    Allergies Patient has no known allergies.  Family History  Problem Relation Age of Onset  . Prostate cancer Neg Hx   . Kidney cancer Neg Hx     Social History Social History   Tobacco Use  . Smoking status: Current Every Day Smoker    Packs/day: 0.50    Years: 13.00    Pack years: 6.50    Types: Cigarettes  . Smokeless tobacco: Never Used  Substance Use Topics  . Alcohol use: Yes    Comment: OCC  . Drug use: Yes    Types: Marijuana    Comment: + UDS FOR COCAINE AND MARIJUANA IN  2017, smoked marijuana 02/27/17, last did cocaine 02/24/17 or 02/23/17 per patient     Review of Systems  Constitutional: No fever/chills Cardiovascular: No chest pain. Respiratory: No SOB. Gastrointestinal: No abdominal pain.  No nausea, no vomiting.  Musculoskeletal: Positive for shoulder pain. Skin: Negative for rash, abrasions, lacerations, ecchymosis. Neurological: Negative for headaches   ____________________________________________   PHYSICAL EXAM:  VITAL SIGNS: ED Triage Vitals [08/11/17 1203]  Enc Vitals Group     BP 135/82     Pulse Rate 84     Resp 18     Temp 98.3 F (36.8 C)     Temp Source Oral     SpO2 100 %      Weight 200 lb (90.7 kg)     Height 6\' 1"  (1.854 m)     Head Circumference      Peak Flow      Pain Score 6     Pain Loc      Pain Edu?      Excl. in GC?      Constitutional: Alert and oriented. Well appearing and in no acute distress. Eyes: Conjunctivae are normal. PERRL. EOMI. Head: Atraumatic. ENT:      Ears:      Nose: No congestion/rhinnorhea.      Mouth/Throat: Mucous membranes are moist.  Neck: No stridor.  No cervical spine tenderness to palpation. Cardiovascular: Normal rate, regular rhythm.  Good peripheral circulation.  Symmetric radial pulses bilaterally. Respiratory: Normal respiratory effort without tachypnea or retractions. Lungs CTAB. Good air entry to the bases with no decreased or absent breath sounds. Musculoskeletal: Full range of motion to all extremities. No gross deformities appreciated. Tenderness to palpation over superior shoulder. Pain with all ROM.  No bruising. Neurologic:  Normal speech and language. No gross focal neurologic deficits are appreciated.  Skin:  Skin is warm, dry and intact. No rash noted.   ____________________________________________   LABS (all labs ordered are listed, but only abnormal results are displayed)  Labs Reviewed - No data to display ____________________________________________  EKG   ____________________________________________  RADIOLOGY Lexine Baton, personally viewed and evaluated these images (plain radiographs) as part of my medical decision making, as well as reviewing the written report by the radiologist.  Dg Shoulder Right  Result Date: 08/11/2017 CLINICAL DATA:  Right shoulder pain after fall a week ago. EXAM: RIGHT SHOULDER - 2+ VIEW COMPARISON:  None. FINDINGS: There is no evidence of fracture or dislocation. There is no evidence of arthropathy or other focal bone abnormality. Soft tissues are unremarkable. IMPRESSION: Negative. Electronically Signed   By: Obie Dredge M.D.   On: 08/11/2017  13:48    ____________________________________________    PROCEDURES  Procedure(s) performed:    Procedures    Medications - No data to display   ____________________________________________   INITIAL IMPRESSION / ASSESSMENT AND PLAN / ED COURSE  Pertinent labs & imaging results that were available during my care of the patient were reviewed by me and considered in my medical decision making (see chart for details).  Review of the [place] CSRS was performed in accordance of the [facility] prior to dispensing any controlled drugs.     Patient presented to the emergency department for evaluation of shoulder injury 1 week ago.  Vital signs and exam are reassuring.  X-ray negative for acute bony a normalities.  This is likely a rotator cuff injury.  Shoulder sling was given.  Patient will be discharged home with prescriptions for meloxicam. Patient is  to follow up with orthopedics as directed. Patient is given ED precautions to return to the ED for any worsening or new symptoms.     ____________________________________________  FINAL CLINICAL IMPRESSION(S) / ED DIAGNOSES  Final diagnoses:  Injury of right shoulder, initial encounter      NEW MEDICATIONS STARTED DURING THIS VISIT:  ED Discharge Orders        Ordered    meloxicam (MOBIC) 15 MG tablet  Daily     08/11/17 1421          This chart was dictated using voice recognition software/Dragon. Despite best efforts to proofread, errors can occur which can change the meaning. Any change was purely unintentional.    Enid DerryWagner, Reynald Woods, PA-C 08/11/17 1930    Emily FilbertWilliams, Jonathan E, MD 08/12/17 [phone]

## 2017-08-11 NOTE — ED Notes (Signed)
Pt fell last week and came to ED and had xray of right ribs that was negative - now the pt right shoulder is giving him trouble - he is having difficulty with ROM

## 2017-08-14 ENCOUNTER — Encounter (INDEPENDENT_AMBULATORY_CARE_PROVIDER_SITE_OTHER): Payer: Self-pay

## 2017-08-14 ENCOUNTER — Ambulatory Visit (INDEPENDENT_AMBULATORY_CARE_PROVIDER_SITE_OTHER): Payer: Medicaid Other | Admitting: Vascular Surgery

## 2017-08-28 ENCOUNTER — Ambulatory Visit (INDEPENDENT_AMBULATORY_CARE_PROVIDER_SITE_OTHER): Payer: Medicaid Other | Admitting: Vascular Surgery

## 2017-09-23 ENCOUNTER — Telehealth (INDEPENDENT_AMBULATORY_CARE_PROVIDER_SITE_OTHER): Payer: Self-pay | Admitting: Vascular Surgery

## 2017-09-23 ENCOUNTER — Other Ambulatory Visit (INDEPENDENT_AMBULATORY_CARE_PROVIDER_SITE_OTHER): Payer: Self-pay | Admitting: Vascular Surgery

## 2017-09-23 NOTE — Telephone Encounter (Signed)
Called the patient to let him know that Dr. Gilda CreaseSchnier has already sent the prescription in to his preferred pharmacy. I had to leave a message, and if the patient has any questions, he was told to call the office.

## 2017-09-29 ENCOUNTER — Ambulatory Visit (INDEPENDENT_AMBULATORY_CARE_PROVIDER_SITE_OTHER): Payer: Medicaid Other | Admitting: Vascular Surgery

## 2017-11-09 ENCOUNTER — Other Ambulatory Visit (INDEPENDENT_AMBULATORY_CARE_PROVIDER_SITE_OTHER): Payer: Self-pay | Admitting: Vascular Surgery

## 2018-06-17 ENCOUNTER — Ambulatory Visit (INDEPENDENT_AMBULATORY_CARE_PROVIDER_SITE_OTHER): Payer: Medicaid Other | Admitting: Vascular Surgery

## 2018-06-24 ENCOUNTER — Ambulatory Visit (INDEPENDENT_AMBULATORY_CARE_PROVIDER_SITE_OTHER): Payer: Medicaid Other | Admitting: Vascular Surgery

## 2018-08-07 ENCOUNTER — Other Ambulatory Visit: Payer: Self-pay | Admitting: Gastroenterology

## 2018-08-07 ENCOUNTER — Ambulatory Visit
Admission: RE | Admit: 2018-08-07 | Discharge: 2018-08-07 | Disposition: A | Discharge status: Home / Self Care | Payer: Medicaid Other | Source: Ambulatory Visit | Attending: Gastroenterology | Admitting: Gastroenterology

## 2018-08-07 DIAGNOSIS — K625 Hemorrhage of anus and rectum: Secondary | ICD-10-CM | POA: Diagnosis present

## 2018-08-07 DIAGNOSIS — R1084 Generalized abdominal pain: Secondary | ICD-10-CM

## 2018-08-07 DIAGNOSIS — K6289 Other specified diseases of anus and rectum: Secondary | ICD-10-CM

## 2018-08-07 DIAGNOSIS — R110 Nausea: Secondary | ICD-10-CM

## 2018-08-07 MED ORDER — IOPAMIDOL (ISOVUE-300) INJECTION 61%
100.0000 mL | Freq: Once | INTRAVENOUS | Status: AC | PRN
  Administered 2018-08-07: 100 mL via INTRAVENOUS

## 2018-08-11 ENCOUNTER — Encounter: Payer: Self-pay | Admitting: Emergency Medicine

## 2018-08-11 ENCOUNTER — Emergency Department
Admission: EM | Admit: 2018-08-11 | Discharge: 2018-08-11 | Disposition: A | Discharge status: Home / Self Care | Payer: Medicaid Other | Attending: Emergency Medicine | Admitting: Emergency Medicine

## 2018-08-11 DIAGNOSIS — J111 Influenza due to unidentified influenza virus with other respiratory manifestations: Secondary | ICD-10-CM | POA: Diagnosis not present

## 2018-08-11 DIAGNOSIS — F329 Major depressive disorder, single episode, unspecified: Secondary | ICD-10-CM | POA: Diagnosis not present

## 2018-08-11 DIAGNOSIS — Z79899 Other long term (current) drug therapy: Secondary | ICD-10-CM | POA: Insufficient documentation

## 2018-08-11 DIAGNOSIS — F1010 Alcohol abuse, uncomplicated: Secondary | ICD-10-CM | POA: Diagnosis not present

## 2018-08-11 DIAGNOSIS — F1410 Cocaine abuse, uncomplicated: Secondary | ICD-10-CM | POA: Diagnosis not present

## 2018-08-11 DIAGNOSIS — R509 Fever, unspecified: Secondary | ICD-10-CM | POA: Diagnosis present

## 2018-08-11 DIAGNOSIS — F17210 Nicotine dependence, cigarettes, uncomplicated: Secondary | ICD-10-CM | POA: Diagnosis not present

## 2018-08-11 DIAGNOSIS — I10 Essential (primary) hypertension: Secondary | ICD-10-CM | POA: Diagnosis not present

## 2018-08-11 MED ORDER — ACETAMINOPHEN 500 MG PO TABS: 500.0000 mg | ORAL_TABLET | Freq: Four times a day (QID) | ORAL | 0 refills | Status: DC | PRN

## 2018-08-11 MED ORDER — OSELTAMIVIR PHOSPHATE 75 MG PO CAPS: 75.0000 mg | ORAL_CAPSULE | Freq: Two times a day (BID) | ORAL | 0 refills | Status: AC

## 2018-08-11 MED ORDER — IPRATROPIUM-ALBUTEROL 0.5-2.5 (3) MG/3ML IN SOLN
3.0000 mL | Freq: Once | RESPIRATORY_TRACT | Status: AC
  Administered 2018-08-11: 3 mL via RESPIRATORY_TRACT
  Filled 2018-08-11: qty 3

## 2018-08-11 MED ORDER — IBUPROFEN 600 MG PO TABS: 600.0000 mg | ORAL_TABLET | Freq: Four times a day (QID) | ORAL | 0 refills | Status: DC | PRN

## 2018-08-11 MED ORDER — ONDANSETRON HCL 4 MG PO TABS: 4.0000 mg | ORAL_TABLET | Freq: Every day | ORAL | 0 refills | Status: AC | PRN

## 2018-08-11 MED ORDER — ONDANSETRON 4 MG PO TBDP
4.0000 mg | ORAL_TABLET | Freq: Once | ORAL | Status: AC
  Administered 2018-08-11: 4 mg via ORAL
  Filled 2018-08-11: qty 1

## 2018-08-11 NOTE — ED Notes (Signed)
See triage note  Presents with body ache.congestion and subjective fever   States sx's started on Sunday  Afebrile on arrival

## 2018-08-11 NOTE — ED Provider Notes (Signed)
[facility] Emergency Department Provider Note  ____________________________________________  Time seen: Approximately 11:46 AM  I have reviewed the triage vital signs and the nursing notes.   HISTORY  Chief Complaint Cough; Nasal Congestion; Fever; and Generalized Body Aches    HPI Guy Rodriguez is a 38 y.o. male that presents to the emergency department for evaluation of fever, chills, night sweats, body aches, sore throat, non productive cough, nausea, and one episode of vomiting for 2 days. Patient took ibuprofen this morning. No diarrhea.     Past Medical History:  Diagnosis Date  . Cocaine abuse (HCC)   . ETOH abuse   . GERD (gastroesophageal reflux disease)   . History of methicillin resistant staphylococcus aureus (MRSA)   . History of participation in smoking cessation counseling   . Hypertension    H/O IN 2016-PCP TOOK PT OFF BP MED DUE TO BP CONTROLLED    Patient Active Problem List   Diagnosis Date Noted  . Leg pain 02/19/2017  . Varicose veins of leg with pain 12/26/2016  . Chronic venous insufficiency 12/26/2016  . Essential hypertension 12/26/2016  . GERD (gastroesophageal reflux disease) 12/26/2016  . Substance induced mood disorder (HCC) 02/14/2016  . Alcohol abuse 02/14/2016  . Cocaine abuse (HCC) 02/14/2016  . 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 APPENDECTOMY    . CARPAL TUNNEL RELEASE Right   . clavicale surgery Left   . VASECTOMY N/A 02/28/2017   Procedure: VASECTOMY;  Surgeon: Hildred Laser, MD;  Location: [facility] ORS;  Service: Urology;  Laterality: N/A;  . VASECTOMY Bilateral 03/28/2017   Procedure: VASECTOMY;  Surgeon: Hildred Laser, MD;  Location: [facility] ORS;  Service: Urology;  Laterality: Bilateral;    Prior to Admission medications   Medication Sig Start Date End Date Taking? Authorizing Provider  acetaminophen (TYLENOL) 500 MG tablet Take 1  tablet (500 mg total) by mouth every 6 (six) hours as needed. 08/11/18   Enid Derry, PA-C  gabapentin (NEURONTIN) 600 MG tablet Take 600 mg by mouth 3 (three) times daily.     [provider]  HYDROcodone-acetaminophen (NORCO) 5-325 MG tablet Take 1 tablet by mouth every 6 (six) hours as needed for moderate pain. 08/04/17   Tommi Rumps, PA-C  ibuprofen (ADVIL,MOTRIN) 600 MG tablet Take 1 tablet (600 mg total) by mouth every 6 (six) hours as needed. 08/11/18   Enid Derry, PA-C  LYRICA 300 MG capsule TAKE 1 CAPSULE BY MOUTH TWICE DAILY 09/23/17   Schnier, Latina Craver, MD  naproxen (NAPROSYN) 500 MG tablet Take 1 tablet (500 mg total) by mouth 2 (two) times daily with a meal. 08/04/17   Bridget Hartshorn L, PA-C  ondansetron (ZOFRAN) 4 MG tablet Take 1 tablet (4 mg total) by mouth daily as needed for nausea or vomiting. 08/11/18 08/11/19  Enid Derry, PA-C  oseltamivir (TAMIFLU) 75 MG capsule Take 1 capsule (75 mg total) by mouth 2 (two) times daily for 5 days. 08/11/18 08/16/18  Enid Derry, PA-C  ranitidine (ZANTAC) 150 MG tablet Take 150 mg by mouth 2 (two) times daily.     [provider]    Allergies Patient has no known allergies.  Family History  Problem Relation Age of Onset  . Prostate cancer Neg Hx   . Kidney cancer Neg Hx     Social History Social History   Tobacco Use  . Smoking status: Current Every Day Smoker  Packs/day: 0.50    Years: 13.00    Pack years: 6.50    Types: Cigarettes  . Smokeless tobacco: Never Used  Substance Use Topics  . Alcohol use: Yes    Comment: OCC  . Drug use: Yes    Types: Marijuana    Comment: + UDS FOR COCAINE AND MARIJUANA IN 2017, smoked marijuana 02/27/17, last did cocaine 02/24/17 or 02/23/17 per patient     Review of Systems  Constitutional: Positive for fever. Eyes: No visual changes. No discharge. ENT: Positive for congestion and rhinorrhea. Cardiovascular: No chest pain. Respiratory: Positive for cough. No  SOB. Gastrointestinal: No abdominal pain. Positive for nausea and vomiting. No diarrhea.  No constipation. Musculoskeletal: Positive for body aches.  Skin: Negative for rash, abrasions, lacerations, ecchymosis. Neurological: Negative for headaches.   ____________________________________________   PHYSICAL EXAM:  VITAL SIGNS: ED Triage Vitals  Enc Vitals Group     BP 08/11/18 1028 114/86     Pulse Rate 08/11/18 1028 80     Resp 08/11/18 1028 18     Temp 08/11/18 1028 98.6 F (37 C)     Temp Source 08/11/18 1028 Oral     SpO2 08/11/18 1028 100 %     Weight 08/11/18 1016 200 lb (90.7 kg)     Height 08/11/18 1016 6\' 1"  (1.854 m)     Head Circumference --      Peak Flow --      Pain Score 08/11/18 1016 10     Pain Loc --      Pain Edu? --      Excl. in GC? --      Constitutional: Alert and oriented. Well appearing and in no acute distress. Eyes: Conjunctivae are normal. PERRL. EOMI. No discharge. Head: Atraumatic. ENT: No frontal and maxillary sinus tenderness.      Ears: Tympanic membranes pearly gray with good landmarks. No discharge.      Nose: No congestion/rhinnorhea.      Mouth/Throat: Mucous membranes are moist. Oropharynx non-erythematous. Tonsils not enlarged. No exudates. Uvula midline. Neck: No stridor.   Hematological/Lymphatic/Immunilogical: No cervical lymphadenopathy. Cardiovascular: Normal rate, regular rhythm.  Good peripheral circulation. Respiratory: Normal respiratory effort without tachypnea or retractions. Lungs CTAB. Good air entry to the bases with no decreased or absent breath sounds. Gastrointestinal: Bowel sounds 4 quadrants. Soft and nontender to palpation. No guarding or rigidity. No palpable masses. No distention. Musculoskeletal: Full range of motion to all extremities. No gross deformities appreciated. No leg swelling. No calf tenderness. Neurologic:  Normal speech and language. No gross focal neurologic deficits are appreciated.  Skin:  Skin  is warm, dry and intact. No rash noted. Psychiatric: Mood and affect are normal. Speech and behavior are normal. Patient exhibits appropriate insight and judgement.   ____________________________________________   LABS (all labs ordered are listed, but only abnormal results are displayed)  Labs Reviewed - No data to display ____________________________________________  EKG   ____________________________________________  RADIOLOGY   No results found.  ____________________________________________    PROCEDURES  Procedure(s) performed:    Procedures    Medications  ondansetron (ZOFRAN-ODT) disintegrating tablet 4 mg (4 mg Oral Given 08/11/18 1205)  ipratropium-albuterol (DUONEB) 0.5-2.5 (3) MG/3ML nebulizer solution 3 mL (3 mLs Nebulization Given 08/11/18 1205)     ____________________________________________   INITIAL IMPRESSION / ASSESSMENT AND PLAN / ED COURSE  Pertinent labs & imaging results that were available during my care of the patient were reviewed by me and considered in my medical decision  making (see chart for details).  Review of the [place] CSRS was performed in accordance of the [facility] prior to dispensing any controlled drugs.   Patient's diagnosis is consistent with influenza like illness. Vital signs and exam are reassuring.  Symptoms are consistent with influenza.  Patient appears well and is staying well hydrated. Patient should alternate tylenol and ibuprofen for fever. Patient feels comfortable going home. Patient will be discharged home with prescriptions for Tamiflu, zofran, Tylenol, Motrin. Patient is to follow up with PCP as needed or otherwise directed. Patient is given ED precautions to return to the ED for any worsening or new symptoms.     ____________________________________________  FINAL CLINICAL IMPRESSION(S) / ED DIAGNOSES  Final diagnoses:  Influenza      NEW MEDICATIONS STARTED DURING THIS VISIT:  ED Discharge Orders          Ordered    oseltamivir (TAMIFLU) 75 MG capsule  2 times daily     08/11/18 1300    ondansetron (ZOFRAN) 4 MG tablet  Daily PRN     08/11/18 1300    acetaminophen (TYLENOL) 500 MG tablet  Every 6 hours PRN     08/11/18 1300    ibuprofen (ADVIL,MOTRIN) 600 MG tablet  Every 6 hours PRN     08/11/18 1300              This chart was dictated using voice recognition software/Dragon. Despite best efforts to proofread, errors can occur which can change the meaning. Any change was purely unintentional.    Enid DerryWagner, Idriss Quackenbush, PA-C 08/11/18 1333    Don PerkingVeronese, [place], MD 08/11/18 [phone]

## 2018-08-11 NOTE — ED Triage Notes (Signed)
Pt reports flu-like sx's for the last few days. Pt c/o fevers, bodyaches, cough, congestion and not feeling well.

## 2018-09-01 ENCOUNTER — Other Ambulatory Visit: Payer: Self-pay

## 2018-09-01 ENCOUNTER — Ambulatory Visit (INDEPENDENT_AMBULATORY_CARE_PROVIDER_SITE_OTHER): Payer: Medicaid Other | Admitting: Vascular Surgery

## 2018-09-01 ENCOUNTER — Encounter (INDEPENDENT_AMBULATORY_CARE_PROVIDER_SITE_OTHER): Payer: Self-pay | Admitting: Vascular Surgery

## 2018-09-01 VITALS — BP 115/80 | HR 87 | Resp 10 | Ht 73.0 in | Wt 202.0 lb

## 2018-09-01 DIAGNOSIS — I83813 Varicose veins of bilateral lower extremities with pain: Secondary | ICD-10-CM

## 2018-09-01 DIAGNOSIS — I872 Venous insufficiency (chronic) (peripheral): Secondary | ICD-10-CM | POA: Diagnosis not present

## 2018-09-01 MED ORDER — PREGABALIN 75 MG PO CAPS: 75.0000 mg | ORAL_CAPSULE | Freq: Every day | ORAL | 0 refills | Status: DC

## 2018-09-01 NOTE — Progress Notes (Signed)
Subjective:    Patient ID: Guy Rodriguez, male    DOB: 02/16/1981, 38 y.o.   MRN: [id number] Chief Complaint  Patient presents with  . Follow-up    Discuss Sclero   Patient was last seen on February 17, 2017 for evaluation of painful lower extremity varicosities.  The patient has either no showed or canceled his last eight appointments.  The patient presents today wanting to "schedule his vein surgery".  The patient is also asking for Lyrica.  The patient states he received a prescription by Dr. Gilda Crease in the past however never filled it as he was taking gabapentin at the time.  The patient notes that the gabapentin has stopped working and would like to now try the Lyrica.  After consulting the [facility] controlled substance database the patient has tried Lyrica in the past and refilled it.  The patient has a past medical history of polysubstance abuse.  I will not refill any controlled substances for him moving forward.  The patient can touch base with his primary care physician for his chronic pain needs or I can refer him to a pain clinic.  The patient notes that he continues to experience pain along the varicosities in his legs.  He notes that he continues to engage in conservative therapy including wearing medical grade 1 compression socks, elevating his legs and remaining active with minimal improvement in his symptoms.  He notes that his symptoms have progressively worsened and now are lifestyle limiting.  The patient was noted to have venous reflux in the past however his studies are over a year old.  As per Dr. Wyn Quaker, new studies in regard to his venous reflux need to be completed before moving forward with any type of procedure.  The patient understands this and is in agreement.  He denies any claudication-like symptoms, rest pain or ulcer formation to the bilateral legs.  Denies any fever, nausea vomiting.  Please note that I copy and pasted from the [facility] controlled substance  website in regard to the patient's prescription history.  08/12/2017 2 02/17/2017 Lyrica 300 Mg Capsule 60.00 30 Gr Sch [id number] [facility] ([id number]) 3/5 4.02 LME Medicaid [place]  08/04/2017 2 08/04/2017 Hydrocodone-Acetamin 5-325 Mg 12.00 3 Rh Sum [id number] [facility] (4231) 0/0 20.00 MME Medicaid [place]  07/11/2017 2 02/17/2017 Lyrica 300 Mg Capsule 60.00 30 Gr Sch [id number] [facility] (4231) 2/5 4.02 LME Medicaid [place]  06/10/2017 2 02/17/2017 Lyrica 300 Mg Capsule 60.00 30 Gr Sch [id number] [facility] (4231) 1/5 4.02 LME Medicaid [place]  05/12/2017 2 02/17/2017 Lyrica 300 Mg Capsule 60.00 30 Gr Sch [id number] [facility] (4231) 0/5 4.02 LME Medicaid [place]  05/10/2017 2 05/10/2017 Hydrocodone-Acetamin 5-325 Mg 8.00 2 Th Gai [id number] [facility] (4231) 0/0 20.00 MME Medicaid [place]  03/28/2017 2 03/28/2017 Oxycodone-Acetaminophen 5-325 20.00 4 Br Bud [id number] [facility] ([id number]) 0/0 37.50 MME Medicaid [place]  02/10/2017 1 02/04/2017 Diazepam 10 Mg Tablet  Review of Systems  Constitutional: Negative.   HENT: Negative.   Eyes: Negative.   Respiratory: Negative.   Cardiovascular:       Painful varicose veins  Gastrointestinal: Negative.   Endocrine: Negative.   Genitourinary: Negative.   Musculoskeletal: Negative.   Skin: Negative.   Allergic/Immunologic: Negative.   Neurological: Negative.   Hematological: Negative.   Psychiatric/Behavioral: Negative.       Objective:   Physical Exam Vitals signs reviewed.  Constitutional:      Appearance: Normal appearance.  HENT:     Head: Normocephalic and atraumatic.  Right Ear: External ear normal.     Left Ear: External ear normal.     Nose: Nose normal.     Mouth/Throat:     Mouth: Mucous membranes are dry.     Pharynx: Oropharynx is clear.  Eyes:     Extraocular Movements: Extraocular movements intact.     Conjunctiva/sclera: Conjunctivae normal.     Pupils: Pupils are equal, round, and reactive to light.  Neck:     Musculoskeletal: Normal range of motion.  Cardiovascular:     Rate and Rhythm: Normal  rate and regular rhythm.  Pulmonary:     Effort: Pulmonary effort is normal.     Breath sounds: Normal breath sounds.  Musculoskeletal: Normal range of motion.  Neurological:     General: No focal deficit present.     Mental Status: He is alert and oriented to person, place, and time. Mental status is at baseline.  Psychiatric:        Mood and Affect: Mood normal.        Behavior: Behavior normal.        Thought Content: Thought content normal.        Judgment: Judgment normal.    BP 115/80 (BP Location: Left Arm, Patient Position: Sitting, Cuff Size: Small)   Pulse 87   Resp 10   Ht  (1.854 m)   Wt 202 lb (91.6 kg)   BMI 26.65 kg/m   Past Medical History:  Diagnosis Date  . Cocaine abuse (HCC)   . ETOH abuse   . GERD (gastroesophageal reflux disease)   . History of methicillin resistant staphylococcus aureus (MRSA)   . History of participation in smoking cessation counseling   . Hypertension    H/O IN 2016-PCP TOOK PT OFF BP MED DUE TO BP CONTROLLED   Social History   Socioeconomic History  . Marital status: Married    Spouse name: Not on file  . Number of children: Not on file  . Years of education: Not on file  . Highest education level: Not on file  Occupational History  . Not on file  Social Needs  . Financial resource strain: Not on file  . Food insecurity:    Worry: Not on file    Inability: Not on file  . Transportation needs:    Medical: Not on file    Non-medical: Not on file  Tobacco Use  . Smoking status: Current Every Day Smoker    Packs/day: 0.50    Years: 13.00    Pack years: 6.50    Types: Cigarettes  . Smokeless tobacco: Never Used  Substance and Sexual Activity  . Alcohol use: Yes    Comment: OCC  . Drug use: Yes    Types: Marijuana    Comment: + UDS FOR COCAINE AND MARIJUANA IN 2017, smoked marijuana 02/27/17, last did cocaine 02/24/17 or 02/23/17 per patient  . Sexual activity: Not on file  Lifestyle  . Physical activity:     Days per week: Not on file    Minutes per session: Not on file  . Stress: Not on file  Relationships  . Social connections:    Talks on phone: Not on file    Gets together: Not on file    Attends religious service: Not on file    Active member of club or organization: Not on file    Attends meetings of clubs or organizations: Not on file    Relationship status: Not on file  .  Intimate partner violence:    Fear of current or ex partner: Not on file    Emotionally abused: Not on file    Physically abused: Not on file    Forced sexual activity: Not on file  Other Topics Concern  . Not on file  Social History Narrative  . Not on file   Past Surgical History:  Procedure Laterality Date  . APPENDECTOMY    . CARPAL TUNNEL RELEASE Right   . clavicale surgery Left   . VASECTOMY N/A 02/28/2017   Procedure: VASECTOMY;  Surgeon: Hildred Laser, MD;  Location: [facility] ORS;  Service: Urology;  Laterality: N/A;  . VASECTOMY Bilateral 03/28/2017   Procedure: VASECTOMY;  Surgeon: Hildred Laser, MD;  Location: [facility] ORS;  Service: Urology;  Laterality: Bilateral;   Family History  Problem Relation Age of Onset  . Prostate cancer Neg Hx   . Kidney cancer Neg Hx    No Known Allergies     Assessment & Plan:  Patient was last seen on February 17, 2017 for evaluation of painful lower extremity varicosities.  The patient has either no showed or canceled his last eight appointments.  The patient presents today wanting to "schedule his vein surgery".  The patient is also asking for Lyrica.  The patient states he received a prescription by Dr. Gilda Crease in the past however never filled it as he was taking gabapentin at the time.  The patient notes that the gabapentin has stopped working and would like to now try the Lyrica.  After consulting the [facility] controlled substance database the patient has tried Lyrica in the past and refilled it.  The patient has a past medical history of polysubstance  abuse.  I will not refill any controlled substances for him moving forward.  The patient can touch base with his primary care physician for his chronic pain needs or I can refer him to a pain clinic.  The patient notes that he continues to experience pain along the varicosities in his legs.  He notes that he continues to engage in conservative therapy including wearing medical grade 1 compression socks, elevating his legs and remaining active with minimal improvement in his symptoms.  He notes that his symptoms have progressively worsened and now are lifestyle limiting.  The patient was noted to have venous reflux in the past however his studies are over a year old.  As per Dr. Wyn Quaker, new studies in regard to his venous reflux need to be completed before moving forward with any type of procedure.  The patient understands this and is in agreement.  He denies any claudication-like symptoms, rest pain or ulcer formation to the bilateral legs.  Denies any fever, nausea vomiting.  Please note that I copy and pasted from the [facility] controlled substance website in regard to the patient's prescription history.  08/12/2017 2 02/17/2017 Lyrica 300 Mg Capsule 60.00 30 Gr Sch [id number] [facility] (4231) 3/5 4.02 LME Medicaid [place]  08/04/2017 2 08/04/2017 Hydrocodone-Acetamin 5-325 Mg 12.00 3 Rh Sum [id number] [facility] (4231) 0/0 20.00 MME Medicaid [place]  07/11/2017 2 02/17/2017 Lyrica 300 Mg Capsule 60.00 30 Gr Sch [id number] Wal (4231) 2/5 4.02 LME Medicaid [place]  06/10/2017 2 02/17/2017 Lyrica 300 Mg Capsule 60.00 30 Gr Sch [id number] Wal (4231) 1/5 4.02 LME Medicaid [place]  05/12/2017 2 02/17/2017 Lyrica 300 Mg Capsule 60.00 30 Gr Sch [id number] Wal (4231) 0/5 4.02 LME Medicaid [place]  05/10/2017 2 05/10/2017 Hydrocodone-Acetamin 5-325 Mg 8.00 2 Ola Spurr [id number]  Wal (4231) 0/0 20.00 MME Medicaid [place]  03/28/2017 2 03/28/2017 Oxycodone-Acetaminophen 5-325 20.00 4 Br Bud [id number] Wal (4231) 0/0 37.50  MME Medicaid [place]  02/10/2017 1 02/04/2017 Diazepam 10 Mg Tablet  1. Varicose veins of both lower extremities with pain - Stable Patient last seen in 02/2017. Patient has either canceled or no showed his last 8 appointments Presents today to "schedule his vein surgery". The patient's ultrasounds are over a year old and after consulting with Dr. Wyn Quaker new ultrasounds are needed before proceeding with any type of procedure. The patient is still complaining of pain along his varicosities. I will bring him back and have him undergo bilateral lower extremity venous duplex to rule out any new/worsening reflux. Patient is also asking for Lyrica.  Patient stated that he received a prescription from Dr. Gilda Crease in the past however never filled it as he was using gabapentin at the time. I consulted the [place] controlled substance database the patient had used the prescription from Dr. Gilda Crease and used its refills. Moving forward, I will not be prescribing the patient any narcotic medications for his chronic pain. Encourage the patient to follow-up with his primary care physician for his chronic pain needs. I will also bring the patient back and have him undergo bilateral ABI to rule out any contributing peripheral artery disease that may be contributing to his chronic pain.  - VAS [place] ABI WITH/WO TBI; Future - VAS [place] LOWER EXTREMITY VENOUS REFLUX; Futur  2. Chronic venous insufficiency - Stable As above  Current Outpatient Medications on File Prior to Visit  Medication Sig Dispense Refill  . ranitidine (ZANTAC) 150 MG tablet Take 150 mg by mouth 2 (two) times daily.     Marland Kitchen acetaminophen (TYLENOL) 500 MG tablet Take 1 tablet (500 mg total) by mouth every 6 (six) hours as needed. (Patient not taking: Reported on 09/01/2018) 30 tablet 0  . HYDROcodone-acetaminophen (NORCO) 5-325 MG tablet Take 1 tablet by mouth every 6 (six) hours as needed for moderate pain. (Patient not taking: Reported on 09/01/2018)  12 tablet 0  . ibuprofen (ADVIL,MOTRIN) 600 MG tablet Take 1 tablet (600 mg total) by mouth every 6 (six) hours as needed. (Patient not taking: Reported on 09/01/2018) 30 tablet 0  . naproxen (NAPROSYN) 500 MG tablet Take 1 tablet (500 mg total) by mouth 2 (two) times daily with a meal. (Patient not taking: Reported on 09/01/2018) 30 tablet 0  . ondansetron (ZOFRAN) 4 MG tablet Take 1 tablet (4 mg total) by mouth daily as needed for nausea or vomiting. (Patient not taking: Reported on 09/01/2018) 15 tablet 0   No current facility-administered medications on file prior to visit.    There are no Patient Instructions on file for this visit. No follow-ups on file.  Beyza Bellino A Piedad Standiford, PA-C

## 2018-09-07 ENCOUNTER — Encounter (INDEPENDENT_AMBULATORY_CARE_PROVIDER_SITE_OTHER): Payer: Medicaid Other

## 2018-09-07 ENCOUNTER — Ambulatory Visit (INDEPENDENT_AMBULATORY_CARE_PROVIDER_SITE_OTHER): Payer: Medicaid Other | Admitting: Vascular Surgery

## 2019-01-07 ENCOUNTER — Ambulatory Visit (INDEPENDENT_AMBULATORY_CARE_PROVIDER_SITE_OTHER): Payer: Medicaid Other | Admitting: Nurse Practitioner

## 2019-01-12 ENCOUNTER — Ambulatory Visit (INDEPENDENT_AMBULATORY_CARE_PROVIDER_SITE_OTHER): Payer: Medicaid Other | Admitting: Nurse Practitioner

## 2019-01-12 ENCOUNTER — Other Ambulatory Visit: Payer: Self-pay

## 2019-01-12 ENCOUNTER — Encounter (INDEPENDENT_AMBULATORY_CARE_PROVIDER_SITE_OTHER): Payer: Self-pay | Admitting: Nurse Practitioner

## 2019-01-12 VITALS — BP 154/110 | HR 93 | Resp 16 | Ht 73.0 in | Wt 187.6 lb

## 2019-01-12 DIAGNOSIS — L03116 Cellulitis of left lower limb: Secondary | ICD-10-CM | POA: Diagnosis not present

## 2019-01-12 DIAGNOSIS — K219 Gastro-esophageal reflux disease without esophagitis: Secondary | ICD-10-CM

## 2019-01-12 DIAGNOSIS — Z79899 Other long term (current) drug therapy: Secondary | ICD-10-CM

## 2019-01-12 DIAGNOSIS — I83813 Varicose veins of bilateral lower extremities with pain: Secondary | ICD-10-CM | POA: Diagnosis not present

## 2019-01-12 MED ORDER — PREGABALIN 75 MG PO CAPS: 75.0000 mg | ORAL_CAPSULE | Freq: Every day | ORAL | 0 refills | Status: DC

## 2019-01-12 MED ORDER — DOXYCYCLINE HYCLATE 100 MG PO CAPS: 100.0000 mg | ORAL_CAPSULE | Freq: Two times a day (BID) | ORAL | 0 refills | Status: DC

## 2019-01-15 ENCOUNTER — Telehealth (INDEPENDENT_AMBULATORY_CARE_PROVIDER_SITE_OTHER): Payer: Self-pay

## 2019-01-17 ENCOUNTER — Encounter (INDEPENDENT_AMBULATORY_CARE_PROVIDER_SITE_OTHER): Payer: Self-pay | Admitting: Nurse Practitioner

## 2019-01-17 DIAGNOSIS — L0390 Cellulitis, unspecified: Secondary | ICD-10-CM | POA: Insufficient documentation

## 2019-01-17 NOTE — Progress Notes (Signed)
SUBJECTIVE:  Patient ID: Guy Rodriguez, male    DOB: 08/09/1980, 38 y.o.   MRN: [id number] Chief Complaint  Patient presents with  . Follow-up    HPI  Guy Rodriguez is a 38 y.o. male . The patient continues to have pain in the lower extremities with dependency. The pain is lessened with elevation. Graduated compression stockings, Class I (20-30 mmHg), have been worn but the stockings do not eliminate the leg pain. Over-the-counter analgesics do not improve the symptoms. The degree of discomfort continues to interfere with daily activities. The patient notes the pain in the legs is causing problems with daily exercise, at the workplace and even with household activities and maintenance such as standing in the kitchen preparing meals and doing dishes.   The patient returns six months after initial visit and has complaints of pain, redness and warmth on his left calf.  The varicose vein underneath is hard and indurated ut the redness spreads further past the point in the varicose vein.  The redness radiates towards his knee.    Past Medical History:  Diagnosis Date  . Cocaine abuse (HCC)   . ETOH abuse   . GERD (gastroesophageal reflux disease)   . History of methicillin resistant staphylococcus aureus (MRSA)   . History of participation in smoking cessation counseling   . Hypertension    H/O IN 2016-PCP TOOK PT OFF BP MED DUE TO BP CONTROLLED    Past Surgical History:  Procedure Laterality Date  . APPENDECTOMY    . CARPAL TUNNEL RELEASE Right   . clavicale surgery Left   . VASECTOMY N/A 02/28/2017   Procedure: VASECTOMY;  Surgeon: Hildred LaserBudzyn, Brian James, MD;  Location: [facility] ORS;  Service: Urology;  Laterality: N/A;  . VASECTOMY Bilateral 03/28/2017   Procedure: VASECTOMY;  Surgeon: Hildred LaserBudzyn, Brian James, MD;  Location: [facility] ORS;  Service: Urology;  Laterality: Bilateral;    Social History   Socioeconomic History  . Marital status: Married    Spouse name: Not on file  . Number of  children: Not on file  . Years of education: Not on file  . Highest education level: Not on file  Occupational History  . Not on file  Social Needs  . Financial resource strain: Not on file  . Food insecurity    Worry: Not on file    Inability: Not on file  . Transportation needs    Medical: Not on file    Non-medical: Not on file  Tobacco Use  . Smoking status: Current Every Day Smoker    Packs/day: 0.50    Years: 13.00    Pack years: 6.50    Types: Cigarettes  . Smokeless tobacco: Never Used  Substance and Sexual Activity  . Alcohol use: Yes    Comment: OCC  . Drug use: Yes    Types: Marijuana    Comment: + UDS FOR COCAINE AND MARIJUANA IN 2017, smoked marijuana 02/27/17, last did cocaine 02/24/17 or 02/23/17 per patient  . Sexual activity: Not on file  Lifestyle  . Physical activity    Days per week: Not on file    Minutes per session: Not on file  . Stress: Not on file  Relationships  . Social Musicianconnections    Talks on phone: Not on file    Gets together: Not on file    Attends religious service: Not on file    Active member of club or organization: Not on file    Attends meetings of clubs or  organizations: Not on file    Relationship status: Not on file  . Intimate partner violence    Fear of current or ex partner: Not on file    Emotionally abused: Not on file    Physically abused: Not on file    Forced sexual activity: Not on file  Other Topics Concern  . Not on file  Social History Narrative  . Not on file    Family History  Problem Relation Age of Onset  . Prostate cancer Neg Hx   . Kidney cancer Neg Hx     No Known Allergies   Review of Systems   Review of Systems: Negative Unless Checked Constitutional: [] Weight loss  [] Fever  [] Chills Cardiac: [] Chest pain   []  Atrial Fibrillation  [] Palpitations   [] Shortness of breath when laying flat   [] Shortness of breath with exertion. [] Shortness of breath at rest Vascular:  [] Pain in legs with walking    [] Pain in legs with standing [] Pain in legs when laying flat   [] Claudication    [] Pain in feet when laying flat    [] History of DVT   [] Phlebitis   [x] Swelling in legs   [x] Varicose veins   [] Non-healing ulcers Pulmonary:   [] Uses home oxygen   [] Productive cough   [] Hemoptysis   [] Wheeze  [] COPD   [] Asthma Neurologic:  [] Dizziness   [] Seizures  [] Blackouts [] History of stroke   [] History of TIA  [] Aphasia   [] Temporary Blindness   [] Weakness or numbness in arm   [] Weakness or numbness in leg Musculoskeletal:   [] Joint swelling   [] Joint pain   [] Low back pain  []  History of Knee Replacement [] Arthritis [] back Surgeries  []  Spinal Stenosis    Hematologic:  [] Easy bruising  [] Easy bleeding   [] Hypercoagulable state   [] Anemic Gastrointestinal:  [] Diarrhea   [] Vomiting  [] Gastroesophageal reflux/heartburn   [] Difficulty swallowing. [] Abdominal pain Genitourinary:  [] Chronic kidney disease   [] Difficult urination  [] Anuric   [] Blood in urine [] Frequent urination  [] Burning with urination   [] Hematuria Skin:  [x] Rashes   [] Ulcers [] Wounds Psychological:  [] History of anxiety   []  History of major depression  []  Memory Difficulties      OBJECTIVE:   Physical Exam  BP (!) 154/110 (BP Location: Right Arm)   Pulse 93   Resp 16   Ht 6\' 1"  (1.854 m)   Wt 187 lb 9.6 oz (85.1 kg)   BMI 24.75 kg/m   Gen: WD/WN, NAD Head: [place]/AT, No temporalis wasting.  Ear/Nose/Throat: Hearing grossly intact, nares w/o erythema or drainage Eyes: PER, EOMI, sclera nonicteric.  Neck: Supple, no masses.  No JVD.  Pulmonary:  Good air movement, no use of accessory muscles.  Cardiac: RRR Vascular: extensive varicose veins bilaterally 5-8 mm in size from groin to ankle.  Scattered spider varicosities.  Hard, red, hot painful area on left calf, radiating to knee.  1+ edema Vessel Right Left  Radial Palpable Palpable  Dorsalis Pedis Palpable Palpable  Posterior Tibial Palpable Palpable   Gastrointestinal: soft,  non-distended. No guarding/no peritoneal signs.  Musculoskeletal: M/S 5/5 throughout.  No deformity or atrophy.  Neurologic: Pain and light touch intact in extremities.  Symmetrical.  Speech is fluent. Motor exam as listed above. Psychiatric: Judgment intact, Mood & affect appropriate for pt's clinical situation. Dermatologic: No Venous rashes. No Ulcers Noted.  Lymph : No Cervical lymphadenopathy, no lichenification or skin changes of chronic lymphedema.       ASSESSMENT AND PLAN:  1. Varicose veins  of both lower extremities with pain  Recommend:  The patient has large symptomatic varicose veins that are painful and associated with swelling.  I have had a long discussion with the patient regarding  varicose veins and why they cause symptoms.  Patient will continue  wearing graduated compression stockings class 1 on a daily basis, beginning first thing in the morning and removing them in the evening. The patient is instructed specifically not to sleep in the stockings.    The patient  will also continue using over-the-counter analgesics such as Motrin 600 mg po TID to help control the symptoms.    In addition, behavioral modification including elevation during the day will be continued.   An  ultrasound of the venous system will be obtained.   Further plans will be based on the ultrasound results and whether conservative therapies are successful at eliminating the pain and swelling.   The patient also has complaints of restless leg and pain in his lower extremities, we will allow a one time Rx of Lyrica. After the patient will need to return to his PCP for further workup  - VAS US LOWER EXTREMITY VENOUS REFLUX; Future  2. Gastroesophageal reflux disease without esophagitis Continue PPI as already ordered, this medication has been reviewed and there are no changes at this time.  Avoidence of caffeine and alcohol  Moderate elevation of the head of the bed   3. Cellulitis of left  lower extremity The redness/tenderness of his leg could be a superficial thrombolphlebitis, however based upon the patient's report of the redness spreading I don't believe that cellulitis can safely be ruled out.  We will do a course of doxycycline and have the patient return in two weeks to evaluate area.    Current Outpatient Medications on File Prior to Visit  Medication Sig Dispense Refill  . acetaminophen (TYLENOL) 500 MG tablet Take 1 tablet (500 mg total) by mouth every 6 (six) hours as needed. (Patient not taking: Reported on 09/01/2018) 30 tablet 0  . HYDROcodone-acetaminophen (NORCO) 5-325 MG tablet Take 1 tablet by mouth every 6 (six) hours as needed for moderate pain. (Patient not taking: Reported on 09/01/2018) 12 tablet 0  . ibuprofen (ADVIL,MOTRIN) 600 MG tablet Take 1 tablet (600 mg total) by mouth every 6 (six) hours as needed. (Patient not taking: Reported on 09/01/2018) 30 tablet 0  . naproxen (NAPROSYN) 500 MG tablet Take 1 tablet (500 mg total) by mouth 2 (two) times daily with a meal. (Patient not taking: Reported on 09/01/2018) 30 tablet 0  . ondansetron (ZOFRAN) 4 MG tablet Take 1 tablet (4 mg total) by mouth daily as needed for nausea or vomiting. (Patient not taking: Reported on 09/01/2018) 15 tablet 0  . ranitidine (ZANTAC) 150 MG tablet Take 150 mg by mouth 2 (two) times daily.      No current facility-administered medications on file prior to visit.     There are no Patient Instructions on file for this visit. No follow-ups on file.   Georgiana SpinnerFallon E Mylen Mangan, NP  This note was completed with Office managerDragon Dictation.  Any errors are purely unintentional.

## 2019-01-18 NOTE — Telephone Encounter (Signed)
Please inform the patient that we can try gabapentin if that works for him

## 2019-01-18 NOTE — Telephone Encounter (Signed)
I spoke with the patient and he stated that he has been on Gabapentin for 4 years and also he is not seeing no improvement with his leg and is requesting to be seen sooner

## 2019-01-18 NOTE — Telephone Encounter (Signed)
I left a message on patient voicemail to return a call back to the office 

## 2019-01-19 NOTE — Telephone Encounter (Signed)
Let's see if we can get him on Dr. Nino Parsley schedule for Thursday

## 2019-01-19 NOTE — Telephone Encounter (Signed)
I spoke with the patient and he will coming in the office on Thursday

## 2019-01-21 ENCOUNTER — Encounter (INDEPENDENT_AMBULATORY_CARE_PROVIDER_SITE_OTHER): Payer: Medicaid Other

## 2019-01-21 ENCOUNTER — Ambulatory Visit (INDEPENDENT_AMBULATORY_CARE_PROVIDER_SITE_OTHER): Payer: Medicaid Other | Admitting: Vascular Surgery

## 2019-01-25 ENCOUNTER — Encounter (INDEPENDENT_AMBULATORY_CARE_PROVIDER_SITE_OTHER): Payer: Medicaid Other

## 2019-01-25 ENCOUNTER — Ambulatory Visit (INDEPENDENT_AMBULATORY_CARE_PROVIDER_SITE_OTHER): Payer: Medicaid Other | Admitting: Vascular Surgery

## 2019-06-16 IMAGING — US US EXTREM LOW VENOUS BILAT
1 series · 13 of 24 positions shown · non-contrast
Comparison: None.

CLINICAL DATA: Acute onset of bilateral lower extremity pain and
swelling. Initial encounter.



[Series 1: us extrem low venous bilat · 0.07mm/px · 13 of 66 slices shown]
[im 1/66]
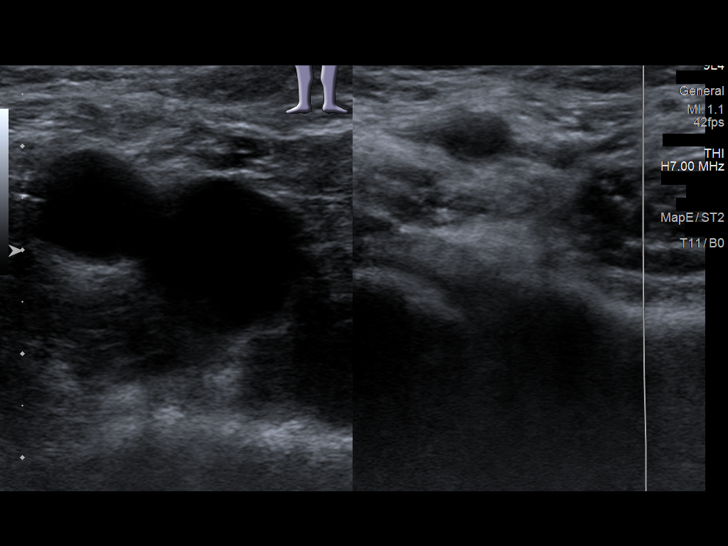
[im 6/66]
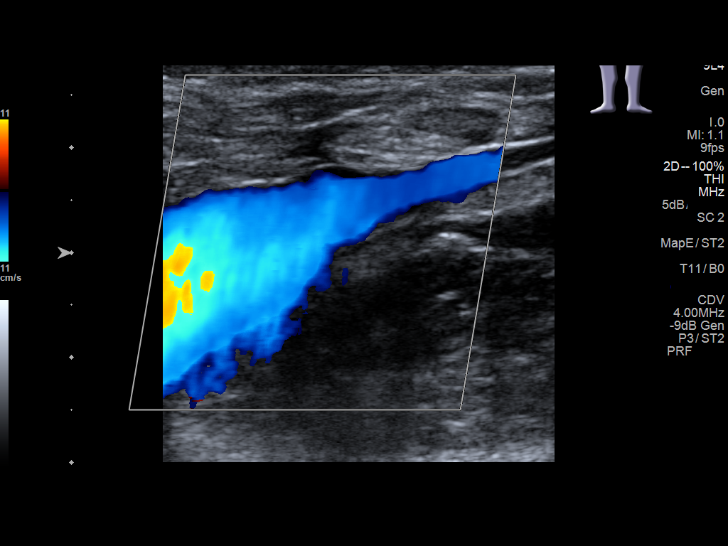
[im 12/66]
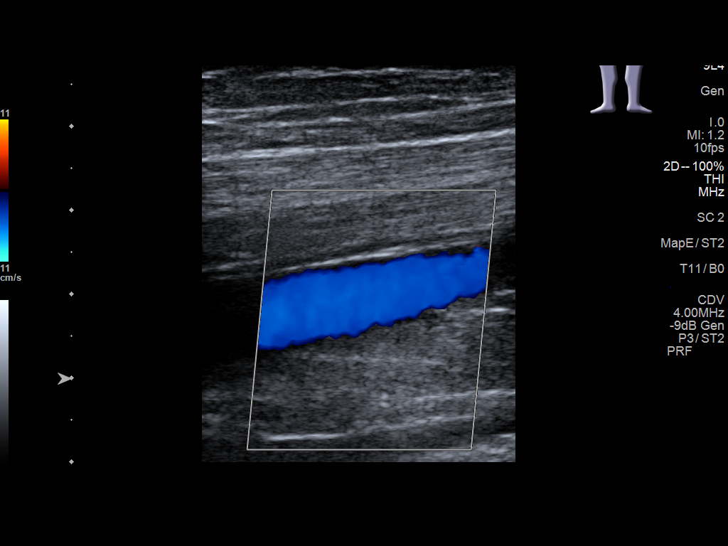
[im 17/66]
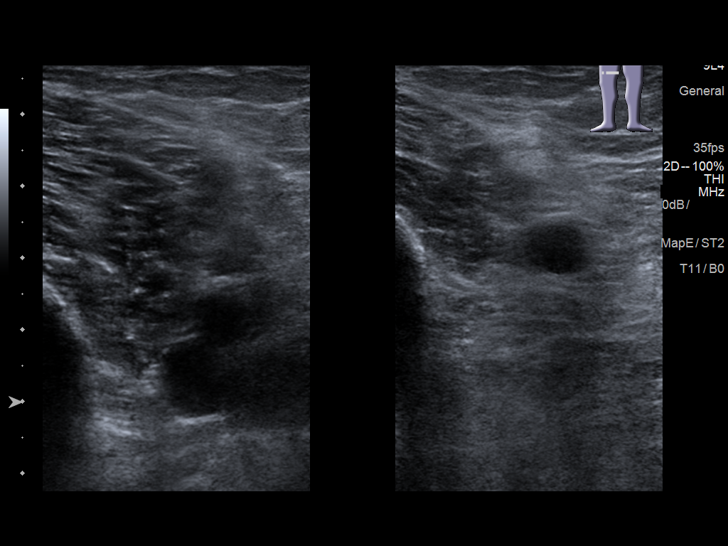
[im 23/66]
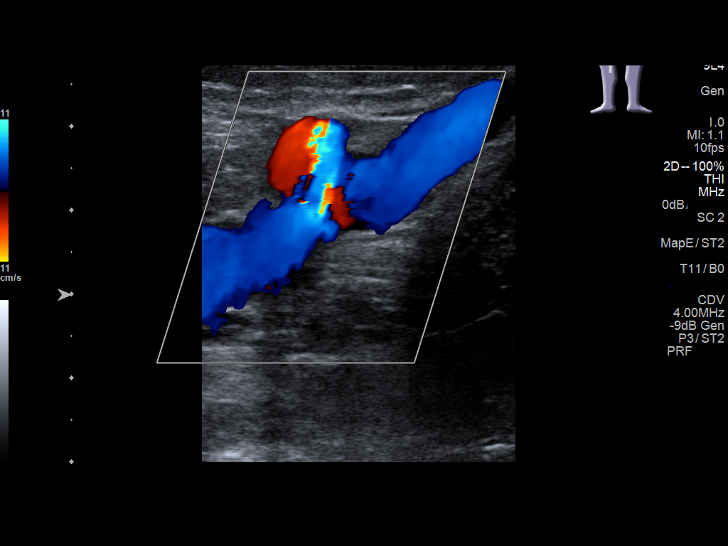
[im 29/66]
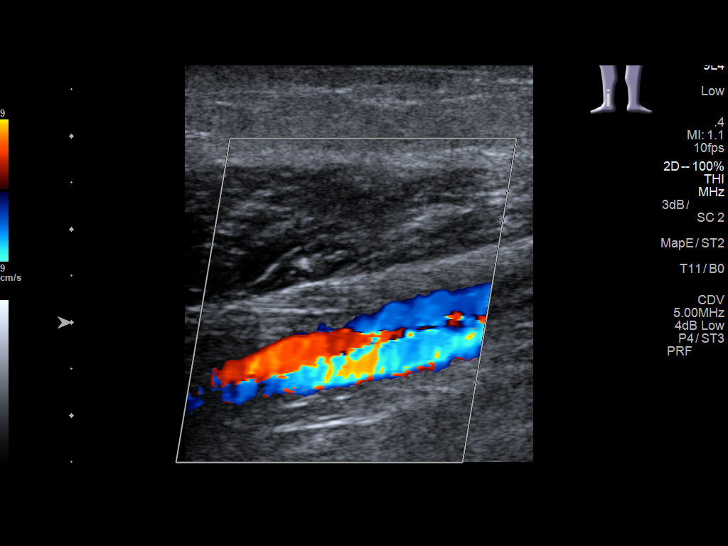
[im 34/66]
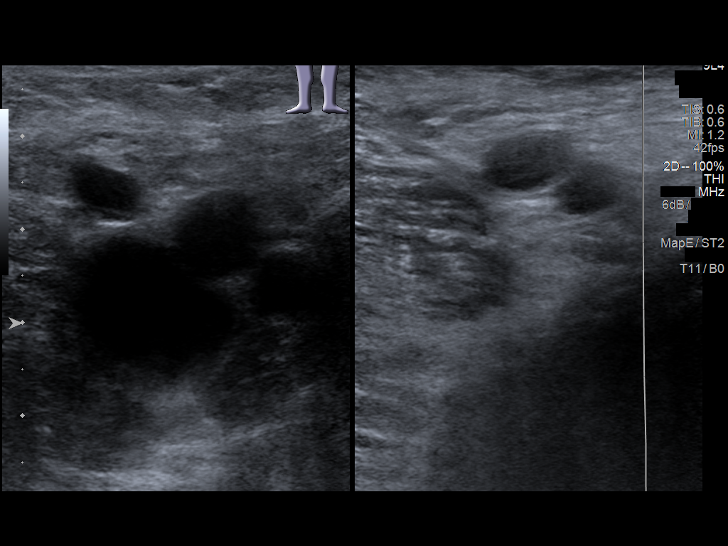
[im 37/66]
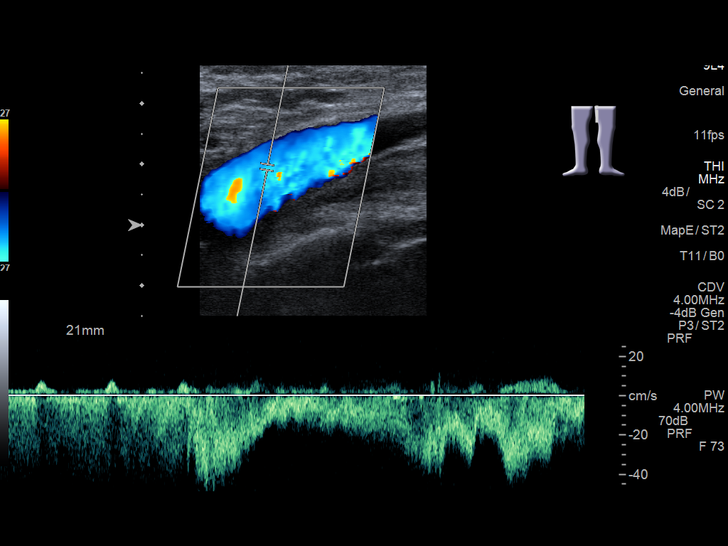
[im 43/66]
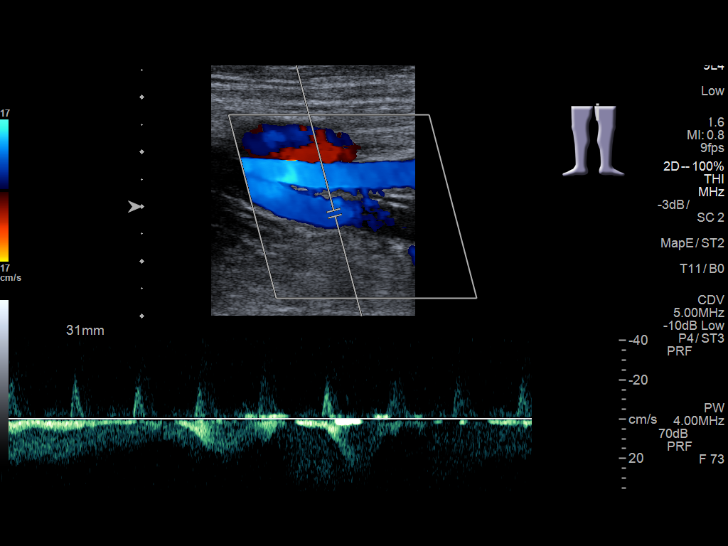
[im 49/66]
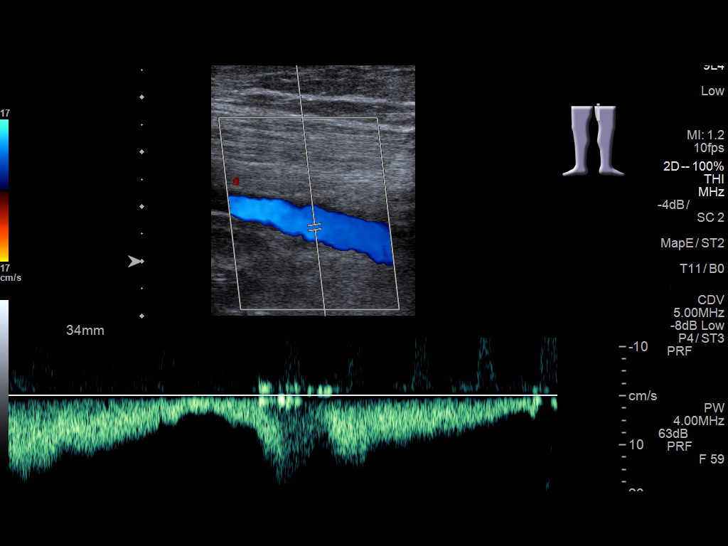
[im 54/66]
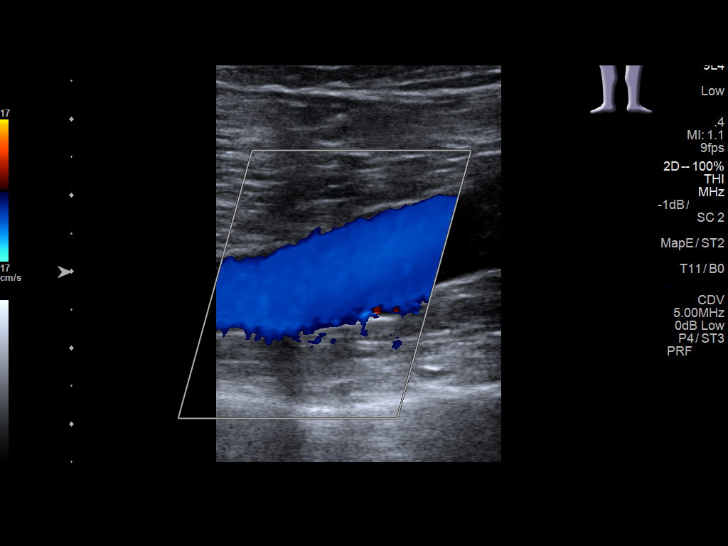
[im 60/66]
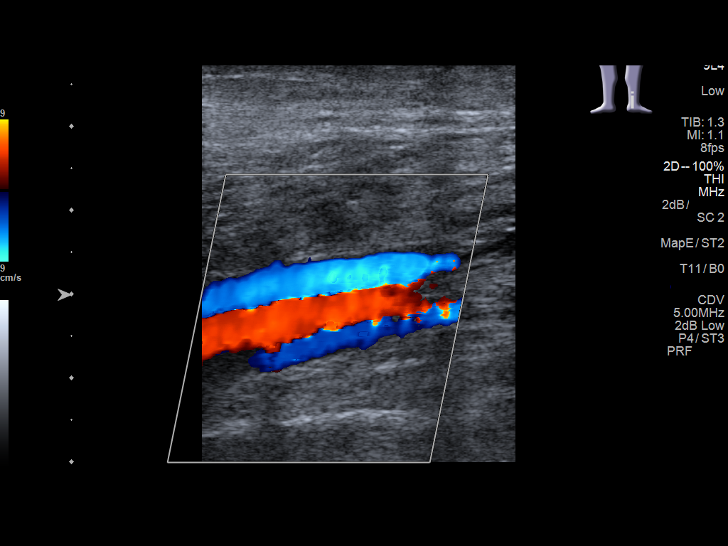
[im 66/66]
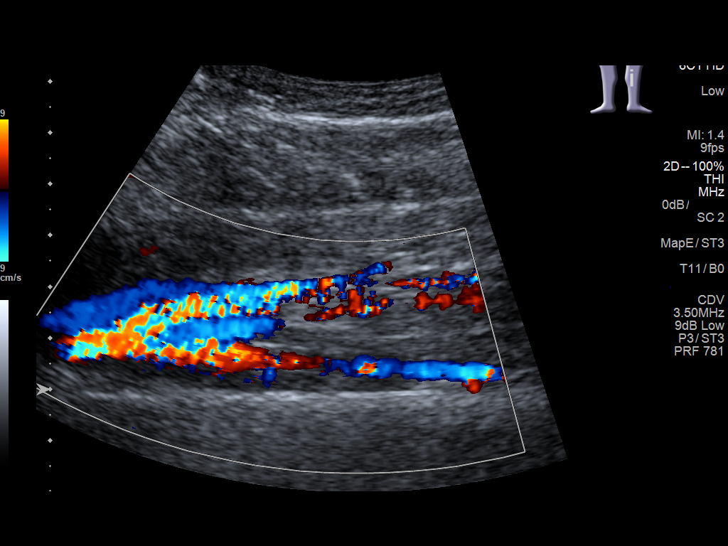

[13 of 24 positions shown; findings below may reference images not displayed]

FINDINGS: RIGHT LOWER EXTREMITY

Common Femoral Vein: No evidence of thrombus. Normal
compressibility, respiratory phasicity and response to augmentation.

Saphenofemoral Junction: No evidence of thrombus. Normal
compressibility and flow on color Doppler imaging.

Profunda Femoral Vein: No evidence of thrombus. Normal
compressibility and flow on color Doppler imaging.

Femoral Vein: No evidence of thrombus. Normal compressibility,
respiratory phasicity and response to augmentation.

Popliteal Vein: No evidence of thrombus. Normal compressibility,
respiratory phasicity and response to augmentation.

Calf Veins: No evidence of thrombus. Normal compressibility and flow
on color Doppler imaging.

Superficial Great Saphenous Vein: No evidence of thrombus. Normal
compressibility and flow on color Doppler imaging.

Venous Reflux:  None.

Other Findings:  None.

LEFT LOWER EXTREMITY

Common Femoral Vein: No evidence of thrombus. Normal
compressibility, respiratory phasicity and response to augmentation.

Saphenofemoral Junction: No evidence of thrombus. Normal
compressibility and flow on color Doppler imaging.

Profunda Femoral Vein: No evidence of thrombus. Normal
compressibility and flow on color Doppler imaging.

Femoral Vein: No evidence of thrombus. Normal compressibility,
respiratory phasicity and response to augmentation.

Popliteal Vein: No evidence of thrombus. Normal compressibility,
respiratory phasicity and response to augmentation.

Calf Veins: Occlusive thrombus is noted at the left peroneal veins.
The posterior tibial vein remains patent.

Superficial Great Saphenous Vein: No evidence of thrombus. Normal
compressibility and flow on color Doppler imaging.

Venous Reflux:  None.

Other Findings:  None.
IMPRESSION: Occlusive deep venous thrombosis noted within the left peroneal
veins.

These results were called by telephone at the time of interpretation
on 02/13/2017 at [DATE] to Dr. Eli Eliyev, who verbally acknowledged
these results.

## 2019-10-29 ENCOUNTER — Telehealth: Payer: Self-pay | Admitting: *Deleted

## 2019-10-29 ENCOUNTER — Emergency Department: Payer: Medicaid Other

## 2019-10-29 ENCOUNTER — Other Ambulatory Visit: Payer: Self-pay

## 2019-10-29 ENCOUNTER — Emergency Department
Admission: EM | Admit: 2019-10-29 | Discharge: 2019-10-29 | Disposition: A | Discharge status: Home / Self Care | Payer: Medicaid Other | Attending: Emergency Medicine | Admitting: Emergency Medicine

## 2019-10-29 DIAGNOSIS — N509 Disorder of male genital organs, unspecified: Secondary | ICD-10-CM | POA: Diagnosis present

## 2019-10-29 DIAGNOSIS — F17210 Nicotine dependence, cigarettes, uncomplicated: Secondary | ICD-10-CM | POA: Insufficient documentation

## 2019-10-29 DIAGNOSIS — N451 Epididymitis: Secondary | ICD-10-CM | POA: Insufficient documentation

## 2019-10-29 DIAGNOSIS — N50811 Right testicular pain: Secondary | ICD-10-CM

## 2019-10-29 DIAGNOSIS — Z79899 Other long term (current) drug therapy: Secondary | ICD-10-CM | POA: Diagnosis not present

## 2019-10-29 DIAGNOSIS — R1031 Right lower quadrant pain: Secondary | ICD-10-CM

## 2019-10-29 DIAGNOSIS — I10 Essential (primary) hypertension: Secondary | ICD-10-CM | POA: Diagnosis not present

## 2019-10-29 DIAGNOSIS — N5089 Other specified disorders of the male genital organs: Secondary | ICD-10-CM

## 2019-10-29 LAB — COMPREHENSIVE METABOLIC PANEL
ALT: 12 U/L (ref 0–44)
AST: 15 U/L (ref 15–41)
Albumin: 3.2 g/dL — ABNORMAL LOW (ref 3.5–5.0)
Alkaline Phosphatase: 50 U/L (ref 38–126)
Anion gap: 7 (ref 5–15)
BUN: 18 mg/dL (ref 6–20)
CO2: 26 mmol/L (ref 22–32)
Calcium: 8.4 mg/dL — ABNORMAL LOW (ref 8.9–10.3)
Chloride: 107 mmol/L (ref 98–111)
Creatinine, Ser: 0.81 mg/dL (ref 0.61–1.24)
GFR calc Af Amer: >60 mL/min (ref >60)
GFR calc non Af Amer: >60 mL/min (ref >60)
Glucose, Bld: 92 mg/dL (ref 70–99)
Potassium: 4.1 mmol/L (ref 3.5–5.1)
Sodium: 140 mmol/L (ref 135–145)
Total Bilirubin: 0.6 mg/dL (ref 0.3–1.2)
Total Protein: 5.6 g/dL — ABNORMAL LOW (ref 6.5–8.1)

## 2019-10-29 LAB — CBC WITH DIFFERENTIAL/PLATELET
Abs Immature Granulocytes: 0.02 10*3/uL (ref 0.00–0.07)
Basophils Absolute: 0.1 10*3/uL (ref 0.0–0.1)
Basophils Relative: 1 %
Eosinophils Absolute: 0.7 10*3/uL — ABNORMAL HIGH (ref 0.0–0.5)
Eosinophils Relative: 11 %
HCT: 43.6 % (ref 39.0–52.0)
Hemoglobin: 14.9 g/dL (ref 13.0–17.0)
Immature Granulocytes: 0 %
Lymphocytes Relative: 37 %
Lymphs Abs: 2.5 10*3/uL (ref 0.7–4.0)
MCH: 30 pg (ref 26.0–34.0)
MCHC: 34.2 g/dL (ref 30.0–36.0)
MCV: 87.9 fL (ref 80.0–100.0)
Monocytes Absolute: 0.5 10*3/uL (ref 0.1–1.0)
Monocytes Relative: 7 %
Neutro Abs: 3.1 10*3/uL (ref 1.7–7.7)
Neutrophils Relative %: 44 %
Platelets: 225 10*3/uL (ref 150–400)
RBC: 4.96 MIL/uL (ref 4.22–5.81)
RDW: 12.6 % (ref 11.5–15.5)
WBC: 6.9 10*3/uL (ref 4.0–10.5)
nRBC: 0 % (ref 0.0–0.2)

## 2019-10-29 LAB — URINALYSIS, COMPLETE (UACMP) WITH MICROSCOPIC
Bacteria, UA: NONE SEEN
Bilirubin Urine: NEGATIVE
Glucose, UA: NEGATIVE mg/dL
Hgb urine dipstick: NEGATIVE
Ketones, ur: NEGATIVE mg/dL
Leukocytes,Ua: NEGATIVE
Nitrite: NEGATIVE
Protein, ur: NEGATIVE mg/dL
Specific Gravity, Urine: 1.02 (ref 1.005–1.030)
pH: 7 (ref 5.0–8.0)

## 2019-10-29 MED ORDER — SODIUM CHLORIDE 0.9% FLUSH: 3.0000 mL | Freq: Once | INTRAVENOUS | Status: DC

## 2019-10-29 MED ORDER — NAPROXEN 500 MG PO TABS: 500.0000 mg | ORAL_TABLET | Freq: Two times a day (BID) | ORAL | Status: DC

## 2019-10-29 MED ORDER — DOXYCYCLINE MONOHYDRATE 100 MG PO CAPS: 100.0000 mg | ORAL_CAPSULE | Freq: Two times a day (BID) | ORAL | 0 refills | Status: DC

## 2019-10-29 NOTE — ED Notes (Signed)
Pt states having constant groin pain to his right testicle radiating to his stomach. Pt denies fevers, nvd, or worsening pain with certain movements.

## 2019-10-29 NOTE — Telephone Encounter (Signed)
Patient called in today and states he as a mass in his testicle and he is in a lot pian of pain . The area has been there for three days. Advised patient to be seen in the ER. Offered him to be seen on Monday . He states he would go the ER

## 2019-10-29 NOTE — Discharge Instructions (Addendum)
Follow discharge care instruction take medication as directed.  If no improvement in 5 to 7 days follow-up with urology listed on your discharge care instructions.

## 2019-10-29 NOTE — ED Triage Notes (Signed)
Pt c/o a  Painful knot in his scrotal sack for the past 3 days. Denies any redness.

## 2019-10-29 NOTE — ED Notes (Signed)
E-signature not working at this time. Pt verbalized understanding of D/C instructions, prescriptions and follow up care with no further questions at this time. Pt in NAD and ambulatory at time of D/C.  

## 2019-10-29 NOTE — ED Provider Notes (Signed)
[facility] Emergency Department Provider Note   ____________________________________________   First MD Initiated Contact with Patient 10/29/19 1256     (approximate)  I have reviewed the triage vital signs and the nursing notes.   HISTORY  Chief Complaint Rectal Pain    HPI Guy Rodriguez is a 39 y.o. male patient complain complaining of a painful knot in the scrotum for the past 3 days.  Patient denies dysuria, urethral discharge, edema or erythema.  Patient rates pain a 6/10.  Patient described pain as "achy".  No palliative measure for complaint.      Past Medical History:  Diagnosis Date  . Cocaine abuse ([place])   . ETOH abuse   . GERD (gastroesophageal reflux disease)   . History of methicillin resistant staphylococcus aureus (MRSA)   . History of participation in smoking cessation counseling   . Hypertension    H/O IN 2016-PCP TOOK PT OFF BP MED DUE TO BP CONTROLLED    Patient Active Problem List   Diagnosis Date Noted  . Cellulitis 01/17/2019  . Leg pain 02/19/2017  . Varicose veins of leg with pain 12/26/2016  . Chronic venous insufficiency 12/26/2016  . Essential hypertension 12/26/2016  . GERD (gastroesophageal reflux disease) 12/26/2016  . Substance induced mood disorder ([place]) 02/14/2016  . Alcohol abuse 02/14/2016  . Cocaine abuse ([place]) 02/14/2016  . Moderate major depression ([place]) 02/14/2016    Past Surgical History:  Procedure Laterality Date  . APPENDECTOMY    . CARPAL TUNNEL RELEASE Right   . clavicale surgery Left   . VASECTOMY N/A 02/28/2017   Procedure: VASECTOMY;  Surgeon: Nickie Retort, MD;  Location: [facility] ORS;  Service: Urology;  Laterality: N/A;  . VASECTOMY Bilateral 03/28/2017   Procedure: VASECTOMY;  Surgeon: Nickie Retort, MD;  Location: [facility] ORS;  Service: Urology;  Laterality: Bilateral;    Prior to Admission medications   Medication Sig Start Date End Date Taking? Authorizing Provider    acetaminophen (TYLENOL) 500 MG tablet Take 1 tablet (500 mg total) by mouth every 6 (six) hours as needed. Patient not taking: Reported on 09/01/2018 08/11/18   Laban Emperor, PA-C  doxycycline (MONODOX) 100 MG capsule Take 1 capsule (100 mg total) by mouth 2 (two) times daily. 10/29/19   Sable Feil, PA-C  doxycycline (VIBRAMYCIN) 100 MG capsule Take 1 capsule (100 mg total) by mouth 2 (two) times daily. 01/12/19   Kris Hartmann, NP  HYDROcodone-acetaminophen (NORCO) 5-325 MG tablet Take 1 tablet by mouth every 6 (six) hours as needed for moderate pain. Patient not taking: Reported on 09/01/2018 08/04/17   Johnn Hai, PA-C  ibuprofen (ADVIL,MOTRIN) 600 MG tablet Take 1 tablet (600 mg total) by mouth every 6 (six) hours as needed. Patient not taking: Reported on 09/01/2018 08/11/18   Laban Emperor, PA-C  naproxen (NAPROSYN) 500 MG tablet Take 1 tablet (500 mg total) by mouth 2 (two) times daily with a meal. Patient not taking: Reported on 09/01/2018 08/04/17   Johnn Hai, PA-C  naproxen (NAPROSYN) 500 MG tablet Take 1 tablet (500 mg total) by mouth 2 (two) times daily with a meal. 10/29/19   Sable Feil, PA-C  pregabalin (LYRICA) 75 MG capsule Take 1 capsule (75 mg total) by mouth daily. 01/12/19   Kris Hartmann, NP  ranitidine (ZANTAC) 150 MG tablet Take 150 mg by mouth 2 (two) times daily.     [provider]    Allergies Patient has no known  allergies.  Family History  Problem Relation Age of Onset  . Prostate cancer Neg Hx   . Kidney cancer Neg Hx     Social History Social History   Tobacco Use  . Smoking status: Current Every Day Smoker    Packs/day: 0.50    Years: 13.00    Pack years: 6.50    Types: Cigarettes  . Smokeless tobacco: Never Used  Substance Use Topics  . Alcohol use: Not Currently    Comment: OCC  . Drug use: Not Currently    Review of Systems Constitutional: No fever/chills Eyes: No visual changes. ENT: No sore  throat. Cardiovascular: Denies chest pain. Respiratory: Denies shortness of breath. Gastrointestinal: No abdominal pain.  No nausea, no vomiting.  No diarrhea.  No constipation. Genitourinary: Negative for dysuria.  Right scrotum pain. Musculoskeletal: Negative for back pain. Skin: Negative for rash. Neurological: Negative for headaches, focal weakness or numbness. Psychiatric:  Depression and recovering drug abuse.   ____________________________________________   PHYSICAL EXAM:  VITAL SIGNS: ED Triage Vitals  Enc Vitals Group     BP 10/29/19 1044 118/71     Pulse Rate 10/29/19 1044 63     Resp 10/29/19 1044 18     Temp 10/29/19 1046 98.1 F (36.7 C)     Temp Source 10/29/19 1044 Oral     SpO2 10/29/19 1044 98 %     Weight 10/29/19 1045 182 lb (82.6 kg)     Height 10/29/19 1045 6\' 1"  (1.854 m)     Head Circumference --      Peak Flow --      Pain Score 10/29/19 1045 6     Pain Loc --      Pain Edu? --      Excl. in GC? --    Constitutional: Alert and oriented. Well appearing and in no acute distress. Cardiovascular: Normal rate, regular rhythm. Grossly normal heart sounds.  Good peripheral circulation. Respiratory: Normal respiratory effort.  No retractions. Lungs CTAB. Genitourinary: Palpable lesion right scrotum area. Musculoskeletal: No lower extremity tenderness nor edema.  No joint effusions. Neurologic:  Normal speech and language. No gross focal neurologic deficits are appreciated. No gait instability. Skin:  Skin is warm, dry and intact. No rash noted. Psychiatric: Mood and affect are normal. Speech and behavior are normal.  ____________________________________________   LABS (all labs ordered are listed, but only abnormal results are displayed)  Labs Reviewed  COMPREHENSIVE METABOLIC PANEL - Abnormal; Notable for the following components:      Result Value   Calcium 8.4 (*)    Total Protein 5.6 (*)    Albumin 3.2 (*)    All other components within normal  limits  CBC WITH DIFFERENTIAL/PLATELET - Abnormal; Notable for the following components:   Eosinophils Absolute 0.7 (*)    All other components within normal limits  URINALYSIS, COMPLETE (UACMP) WITH MICROSCOPIC - Abnormal; Notable for the following components:   Color, Urine YELLOW (*)    APPearance CLEAR (*)    All other components within normal limits   ____________________________________________  EKG   ____________________________________________  RADIOLOGY  ED MD interpretation:    Official radiology report(s): 10/31/19 SCROTUM W/DOPPLER  Result Date: 10/29/2019 CLINICAL DATA:  Right testicle and right groin pain, painful knot in scrotal sac EXAM: SCROTAL ULTRASOUND DOPPLER ULTRASOUND OF THE TESTICLES TECHNIQUE: Complete ultrasound examination of the testicles, epididymis, and other scrotal structures was performed. Color and spectral Doppler ultrasound were also utilized to evaluate blood flow to the  testicles. COMPARISON:  06/13/2017 FINDINGS: Right testicle Measurements: 4.2 x 1.9 x 3.0 cm. No mass or microlithiasis visualized. Left testicle Measurements: 4.0 x 1.8 x 2.8 cm. No mass or microlithiasis visualized. Right epididymis: Slightly enlarged and hypervascular appearance of the epididymal tail. Left epididymis:  Normal in size and appearance. Hydrocele:  None visualized. Varicocele:  None visualized. Pulsed Doppler interrogation of both testes demonstrates normal low resistance arterial and venous waveforms bilaterally. IMPRESSION: 1. Slightly enlarged and hypervascular appearance of the right epididymal tail, which corresponds to patient identified site of palpable abnormality. Findings are consistent with epididymitis. 2. Normal ultrasound size and appearance of the bilateral testicles proper. Electronically Signed   By: Lauralyn Primes M.D.   On: 10/29/2019 14:41    ____________________________________________   PROCEDURES  Procedure(s) performed (including Critical  Care):  Procedures   ____________________________________________   INITIAL IMPRESSION / ASSESSMENT AND PLAN / ED COURSE  As part of my medical decision making, I reviewed the following data within the electronic MEDICAL RECORD NUMBER     Patient complain of scrotal pain for 3 days.  Ultrasound consistent with mild epididymitis.  Patient given discharge care instruction advised take medication as directed.  Patient advised if no improvement in 5 to 7 days follow-up with urologist listed on discharge care instructions.    Wendal Wilkie was evaluated in Emergency Department on 10/29/2019 for the symptoms described in the history of present illness. He was evaluated in the context of the global COVID-19 pandemic, which necessitated consideration that the patient might be at risk for infection with the SARS-CoV-2 virus that causes COVID-19. Institutional protocols and algorithms that pertain to the evaluation of patients at risk for COVID-19 are in a state of rapid change based on information released by regulatory bodies including the CDC and federal and state organizations. These policies and algorithms were followed during the patient's care in the ED.       ____________________________________________   FINAL CLINICAL IMPRESSION(S) / ED DIAGNOSES  Final diagnoses:  Epididymitis     ED Discharge Orders         Ordered    doxycycline (MONODOX) 100 MG capsule  2 times daily     10/29/19 1454    naproxen (NAPROSYN) 500 MG tablet  2 times daily with meals     10/29/19 1455           Note:  This document was prepared using Dragon voice recognition software and may include unintentional dictation errors.    Joni Reining, PA-C 10/29/19 1503    Chesley Noon, MD 10/30/19 [id number]

## 2020-01-05 ENCOUNTER — Encounter (INDEPENDENT_AMBULATORY_CARE_PROVIDER_SITE_OTHER): Payer: Medicaid Other

## 2020-01-05 ENCOUNTER — Ambulatory Visit (INDEPENDENT_AMBULATORY_CARE_PROVIDER_SITE_OTHER): Payer: Medicaid Other | Admitting: Nurse Practitioner

## 2020-01-21 ENCOUNTER — Ambulatory Visit (INDEPENDENT_AMBULATORY_CARE_PROVIDER_SITE_OTHER): Payer: Medicaid Other | Admitting: Nurse Practitioner

## 2020-01-21 ENCOUNTER — Other Ambulatory Visit (INDEPENDENT_AMBULATORY_CARE_PROVIDER_SITE_OTHER): Payer: Self-pay | Admitting: Nurse Practitioner

## 2020-01-21 ENCOUNTER — Ambulatory Visit (INDEPENDENT_AMBULATORY_CARE_PROVIDER_SITE_OTHER): Payer: Medicaid Other

## 2020-01-21 DIAGNOSIS — I83813 Varicose veins of bilateral lower extremities with pain: Secondary | ICD-10-CM

## 2020-01-21 DIAGNOSIS — I872 Venous insufficiency (chronic) (peripheral): Secondary | ICD-10-CM

## 2020-02-14 ENCOUNTER — Ambulatory Visit (INDEPENDENT_AMBULATORY_CARE_PROVIDER_SITE_OTHER): Payer: Medicaid Other | Admitting: Nurse Practitioner

## 2020-02-14 ENCOUNTER — Other Ambulatory Visit: Payer: Self-pay

## 2020-02-14 ENCOUNTER — Encounter (INDEPENDENT_AMBULATORY_CARE_PROVIDER_SITE_OTHER): Payer: Self-pay | Admitting: Nurse Practitioner

## 2020-02-14 ENCOUNTER — Ambulatory Visit (INDEPENDENT_AMBULATORY_CARE_PROVIDER_SITE_OTHER): Payer: Medicaid Other

## 2020-02-14 VITALS — BP 125/82 | HR 55 | Resp 16 | Wt 177.8 lb

## 2020-02-14 DIAGNOSIS — I83813 Varicose veins of bilateral lower extremities with pain: Secondary | ICD-10-CM

## 2020-02-14 DIAGNOSIS — I872 Venous insufficiency (chronic) (peripheral): Secondary | ICD-10-CM

## 2020-02-14 DIAGNOSIS — I10 Essential (primary) hypertension: Secondary | ICD-10-CM

## 2020-02-15 ENCOUNTER — Encounter (INDEPENDENT_AMBULATORY_CARE_PROVIDER_SITE_OTHER): Payer: Self-pay | Admitting: Nurse Practitioner

## 2020-02-15 NOTE — Progress Notes (Signed)
Subjective:    Patient ID: Guy Rodriguez, male    DOB: [birth year], 39 y.o.   MRN: [id number] Chief Complaint  Patient presents with  . Follow-up    ultrasound follow up    The patient returns for followup evaluation 12 months after the initial visit. The patient continues to have pain in the lower extremities with dependency. The pain is lessened with elevation. Graduated compression stockings, Class I (20-30 mmHg), have been worn but the stockings do not eliminate the leg pain. Over-the-counter analgesics do not improve the symptoms. The degree of discomfort continues to interfere with daily activities. The patient notes the pain in the legs is causing problems with daily exercise, at the workplace and even with household activities and maintenance such as standing in the kitchen preparing meals and doing dishes.   Today, noninvasive studies show evidence of deep venous insufficiency in the right femoral vein and popliteal vein.  The left lower extremity has deep venous insufficiency in the femoral vein as well.  The right lower extremity has evidence of reflux in the right small saphenous vein with vein diameters that range from 0.49 cm to 0.67 cm.  The left lower extremity has reflux in the great saphenous vein at the saphenofemoral junction as well as the mid thigh.  Vein diameters range from 0.51cm to 0.61 cm.   Review of Systems  Cardiovascular: Positive for leg swelling.  Musculoskeletal: Positive for arthralgias and myalgias.       Objective:   Physical Exam Vitals reviewed.  HENT:     Head: Normocephalic.  Cardiovascular:     Rate and Rhythm: Normal rate and regular rhythm.     Pulses: Normal pulses.     Comments: Large varicosities seen bilaterally Pulmonary:     Effort: Pulmonary effort is normal.     Breath sounds: Normal breath sounds.  Skin:    General: Skin is warm and dry.  Neurological:     Mental Status: He is alert and oriented to person, place, and time.    Psychiatric:        Mood and Affect: Mood normal.        Behavior: Behavior normal.        Thought Content: Thought content normal.        Judgment: Judgment normal.     BP 125/82 (BP Location: Right Arm)   Pulse (!) 55   Resp 16   Wt 177 lb 12.8 oz (80.6 kg)   BMI 23.46 kg/m   Past Medical History:  Diagnosis Date  . Cocaine abuse ([facility])   . ETOH abuse   . GERD (gastroesophageal reflux disease)   . History of methicillin resistant staphylococcus aureus (MRSA)   . History of participation in smoking cessation counseling   . Hypertension    H/O IN 2016-PCP TOOK PT OFF BP MED DUE TO BP CONTROLLED    Social History   Socioeconomic History  . Marital status: Married    Spouse name: Not on file  . Number of children: Not on file  . Years of education: Not on file  . Highest education level: Not on file  Occupational History  . Not on file  Tobacco Use  . Smoking status: Current Every Day Smoker    Packs/day: 0.50    Years: 13.00    Pack years: 6.50    Types: Cigarettes  . Smokeless tobacco: Never Used  Vaping Use  . Vaping Use: Never used  Substance and Sexual Activity  .  Alcohol use: Not Currently    Comment: OCC  . Drug use: Not Currently  . Sexual activity: Not on file  Other Topics Concern  . Not on file  Social History Narrative  . Not on file   Social Determinants of Health   Financial Resource Strain:   . Difficulty of Paying Living Expenses:   Food Insecurity:   . Worried About Programme researcher, broadcasting/film/video in the Last Year:   . Barista in the Last Year:   Transportation Needs:   . Freight forwarder (Medical):   Marland Kitchen Lack of Transportation (Non-Medical):   Physical Activity:   . Days of Exercise per Week:   . Minutes of Exercise per Session:   Stress:   . Feeling of Stress :   Social Connections:   . Frequency of Communication with Friends and Family:   . Frequency of Social Gatherings with Friends and Family:   . Attends Religious  Services:   . Active Member of Clubs or Organizations:   . Attends Banker Meetings:   Marland Kitchen Marital Status:   Intimate Partner Violence:   . Fear of Current or Ex-Partner:   . Emotionally Abused:   Marland Kitchen Physically Abused:   . Sexually Abused:     Past Surgical History:  Procedure Laterality Date  . APPENDECTOMY    . CARPAL TUNNEL RELEASE Right   . clavicale surgery Left   . VASECTOMY N/A 02/28/2017   Procedure: VASECTOMY;  Surgeon: Hildred Laser, MD;  Location: [facility] ORS;  Service: Urology;  Laterality: N/A;  . VASECTOMY Bilateral 03/28/2017   Procedure: VASECTOMY;  Surgeon: Hildred Laser, MD;  Location: [facility] ORS;  Service: Urology;  Laterality: Bilateral;    Family History  Problem Relation Age of Onset  . Prostate cancer Neg Hx   . Kidney cancer Neg Hx     No Known Allergies     Assessment & Plan:   1. Varicose veins of both lower extremities with pain Recommend  I have reviewed my previous  discussion with the patient regarding  varicose veins and why they cause symptoms. Patient will continue  wearing graduated compression stockings class 1 on a daily basis, beginning first thing in the morning and removing them in the evening.    In addition, behavioral modification including elevation during the day was again discussed and this will continue.  The patient has utilized over the counter pain medications and has been exercising.  However, at this time conservative therapy has not alleviated the patient's symptoms of leg pain and swelling  Recommend: laser ablation of the right small saphenous vein and  left great saphenous vein to eliminate the symptoms of pain and swelling of the lower extremities caused by the severe superficial venous reflux disease.   2. Essential hypertension Continue antihypertensive medications as already ordered, these medications have been reviewed and there are no changes at this time.    Current Outpatient Medications  on File Prior to Visit  Medication Sig Dispense Refill  . omeprazole (PRILOSEC) 20 MG capsule Take 20 mg by mouth daily.     . SUBOXONE 8-2 MG FILM Place under the tongue 2 (two) times daily.     Marland Kitchen acetaminophen (TYLENOL) 500 MG tablet Take 1 tablet (500 mg total) by mouth every 6 (six) hours as needed. (Patient not taking: Reported on 09/01/2018) 30 tablet 0  . doxycycline (MONODOX) 100 MG capsule Take 1 capsule (100 mg total) by mouth 2 (two)  times daily. (Patient not taking: Reported on 02/14/2020) 20 capsule 0  . doxycycline (VIBRAMYCIN) 100 MG capsule Take 1 capsule (100 mg total) by mouth 2 (two) times daily. (Patient not taking: Reported on 02/14/2020) 20 capsule 0  . HYDROcodone-acetaminophen (NORCO) 5-325 MG tablet Take 1 tablet by mouth every 6 (six) hours as needed for moderate pain. (Patient not taking: Reported on 09/01/2018) 12 tablet 0  . ibuprofen (ADVIL,MOTRIN) 600 MG tablet Take 1 tablet (600 mg total) by mouth every 6 (six) hours as needed. (Patient not taking: Reported on 09/01/2018) 30 tablet 0  . naproxen (NAPROSYN) 500 MG tablet Take 1 tablet (500 mg total) by mouth 2 (two) times daily with a meal. (Patient not taking: Reported on 09/01/2018) 30 tablet 0  . naproxen (NAPROSYN) 500 MG tablet Take 1 tablet (500 mg total) by mouth 2 (two) times daily with a meal. (Patient not taking: Reported on 02/14/2020) 20 tablet 00  . pregabalin (LYRICA) 75 MG capsule Take 1 capsule (75 mg total) by mouth daily. (Patient not taking: Reported on 02/14/2020) 30 capsule 0  . ranitidine (ZANTAC) 150 MG tablet Take 150 mg by mouth 2 (two) times daily.  (Patient not taking: Reported on 02/14/2020)     No current facility-administered medications on file prior to visit.    There are no Patient Instructions on file for this visit. No follow-ups on file.   Georgiana Spinner, NP

## 2020-03-09 ENCOUNTER — Telehealth (INDEPENDENT_AMBULATORY_CARE_PROVIDER_SITE_OTHER): Payer: Self-pay | Admitting: Vascular Surgery

## 2020-03-09 ENCOUNTER — Other Ambulatory Visit (INDEPENDENT_AMBULATORY_CARE_PROVIDER_SITE_OTHER): Payer: Medicaid Other | Admitting: Vascular Surgery

## 2020-03-09 NOTE — Telephone Encounter (Signed)
Patient left a voicemail concerning his RX that was prescribed by [facility] at last visit 02-14-20 with LE Ven Reflux studies. He stated that his pharmacy will not fill the RX and he was wondering if he needs to come by to get another script or if he needs to go to another pharmacy. He mentioned it may have something to do with his insurance (medicaid). Please advise.

## 2020-03-09 NOTE — Telephone Encounter (Signed)
I called and left a voice mail for the pt and made him aware that per his pharmacy he needs to call medicaid and have the provider he saw here added to his medicaid and that it cannot be be switched to cash. I advised him that if he has any questions about this matter to call his pharmacy or any other questions to call back to our office.

## 2020-03-14 ENCOUNTER — Encounter (INDEPENDENT_AMBULATORY_CARE_PROVIDER_SITE_OTHER): Payer: Medicaid Other

## 2020-03-30 ENCOUNTER — Other Ambulatory Visit (INDEPENDENT_AMBULATORY_CARE_PROVIDER_SITE_OTHER): Payer: Medicaid Other | Admitting: Vascular Surgery

## 2020-04-03 ENCOUNTER — Encounter (INDEPENDENT_AMBULATORY_CARE_PROVIDER_SITE_OTHER): Payer: Medicaid Other

## 2020-05-22 ENCOUNTER — Ambulatory Visit (INDEPENDENT_AMBULATORY_CARE_PROVIDER_SITE_OTHER): Payer: Medicaid Other

## 2020-05-22 ENCOUNTER — Ambulatory Visit
Admission: EM | Admit: 2020-05-22 | Discharge: 2020-05-22 | Disposition: A | Discharge status: Home / Self Care | Payer: Medicaid Other | Attending: Family Medicine | Admitting: Family Medicine

## 2020-05-22 ENCOUNTER — Other Ambulatory Visit: Payer: Self-pay

## 2020-05-22 DIAGNOSIS — N50811 Right testicular pain: Secondary | ICD-10-CM

## 2020-05-22 DIAGNOSIS — N50819 Testicular pain, unspecified: Secondary | ICD-10-CM

## 2020-05-22 DIAGNOSIS — N451 Epididymitis: Secondary | ICD-10-CM | POA: Diagnosis not present

## 2020-05-22 MED ORDER — LEVOFLOXACIN 500 MG PO TABS: 500.0000 mg | ORAL_TABLET | Freq: Every day | ORAL | 0 refills | Status: DC

## 2020-05-22 NOTE — ED Provider Notes (Signed)
[facility]    CSN: [id number] Arrival date & time: 05/22/20  1323      History   Chief Complaint Chief Complaint  Patient presents with  . Testicle Pain   HPI  39 year old male presents with testicle pain.  Patient reports right testicle pain for the past 1 to 2 weeks. Patient reports a firm area that is painful on his right testicle. Patient also reports that the pain radiates upwards to his groin. He also reports back pain. He has a history of epididymitis per the medical chart. No fever. No urinary symptoms. No other associated symptoms. No other complaints.  Past Medical History:  Diagnosis Date  . Cocaine abuse (HCC)   . ETOH abuse   . GERD (gastroesophageal reflux disease)   . History of methicillin resistant staphylococcus aureus (MRSA)   . History of participation in smoking cessation counseling   . Hypertension    H/O IN 2016-PCP TOOK PT OFF BP MED DUE TO BP CONTROLLED    Patient Active Problem List   Diagnosis Date Noted  . Cellulitis 01/17/2019  . Leg pain 02/19/2017  . Varicose veins of leg with pain 12/26/2016  . Chronic venous insufficiency 12/26/2016  . Essential hypertension 12/26/2016  . GERD (gastroesophageal reflux disease) 12/26/2016  . Substance induced mood disorder (HCC) 02/14/2016  . Alcohol abuse 02/14/2016  . Cocaine abuse (HCC) 02/14/2016  . 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 APPENDECTOMY    . BAND HEMORRHOIDECTOMY    . CARPAL TUNNEL RELEASE Right   . clavicale surgery Left   . VASECTOMY N/A 02/28/2017   Procedure: VASECTOMY;  Surgeon: Hildred Laser, MD;  Location: [facility];  Service: Urology;  Laterality: N/A;  . VASECTOMY Bilateral 03/28/2017   Procedure: VASECTOMY;  Surgeon: Hildred Laser, MD;  Location: [facility];  Service: Urology;  Laterality: Bilateral;       Home Medications    Prior to Admission medications   Medication Sig Start  Date End Date Taking? Authorizing Provider  levofloxacin (LEVAQUIN) 500 MG tablet Take 1 tablet (500 mg total) by mouth daily. 05/22/20   Tommie Sams, DO  omeprazole (PRILOSEC) 20 MG capsule Take 20 mg by mouth daily.  09/06/19   [provider]  SUBOXONE 8-2 MG FILM Place under the tongue 2 (two) times daily.  10/28/19   [provider]  pregabalin (LYRICA) 75 MG capsule Take 1 capsule (75 mg total) by mouth daily. Patient not taking: Reported on 02/14/2020 01/12/19 05/22/20  Georgiana Spinner, NP  ranitidine (ZANTAC) 150 MG tablet Take 150 mg by mouth 2 (two) times daily.  Patient not taking: Reported on 02/14/2020  05/22/20  [provider]    Family History Family History  Problem Relation Age of Onset  . Prostate cancer Neg Hx   . Kidney cancer Neg Hx     Social History Social History   Tobacco Use  . Smoking status: Current Every Day Smoker    Packs/day: 0.50    Years: 13.00    Pack years: 6.50    Types: Cigarettes  . Smokeless tobacco: Never Used  Vaping Use  . Vaping Use: Never used  Substance Use Topics  . Alcohol use: Not Currently    Comment: OCC  . Drug use: Not Currently     Allergies   Patient has no known allergies.   Review of Systems Review of Systems  Genitourinary: Positive for  testicular pain.   Physical Exam Triage Vital Signs ED Triage Vitals  Enc Vitals Group     BP 05/22/20 1414 121/85     Pulse Rate 05/22/20 1414 (!) 55     Resp 05/22/20 1414 18     Temp 05/22/20 1414 98.3 F (36.8 C)     Temp Source 05/22/20 1414 Oral     SpO2 05/22/20 1414 97 %     Weight 05/22/20 1407 170 lb (77.1 kg)     Height 05/22/20 1407 6\' 1"  (1.854 m)     Head Circumference --      Peak Flow --      Pain Score 05/22/20 1407 10     Pain Loc --      Pain Edu? --      Excl. in GC? --    No data found.  Updated Vital Signs BP 121/85 (BP Location: Left Arm)   Pulse (!) 55   Temp 98.3 F (36.8 C) (Oral)   Resp 18   Ht 6\' 1"  (1.854  m)   Wt 77.1 kg   SpO2 97%   BMI 22.43 kg/m   Visual Acuity Right Eye Distance:   Left Eye Distance:   Bilateral Distance:    Right Eye Near:   Left Eye Near:    Bilateral Near:     Physical Exam Vitals and nursing note reviewed.  Constitutional:      General: He is not in acute distress.    Appearance: Normal appearance. He is not ill-appearing.  HENT:     Head: Normocephalic and atraumatic.  Eyes:     General:        Right eye: No discharge.        Left eye: No discharge.     Conjunctiva/sclera: Conjunctivae normal.  Pulmonary:     Effort: Pulmonary effort is normal. No respiratory distress.  Genitourinary:    Testes:        Left: Mass or tenderness not present.     Epididymis:     Right: Tenderness present.  Neurological:     Mental Status: He is alert.  Psychiatric:        Mood and Affect: Mood normal.        Behavior: Behavior normal.    UC Treatments / Results  Labs (all labs ordered are listed, but only abnormal results are displayed) Labs Reviewed - No data to display  EKG   Radiology 05/24/20 SCROTUM W/DOPPLER  Result Date: 05/22/2020 CLINICAL DATA:  Right-sided testicle pain and lump for 2 weeks, history of epididymitis, history of vasectomy EXAM: SCROTAL ULTRASOUND DOPPLER ULTRASOUND OF THE TESTICLES TECHNIQUE: Complete ultrasound examination of the testicles, epididymis, and other scrotal structures was performed. Color and spectral Doppler ultrasound were also utilized to evaluate blood flow to the testicles. COMPARISON:  10/29/2019 FINDINGS: Right testicle Measurements: 3.2 x 2.3 x 2.7 cm. No mass or microlithiasis visualized. Left testicle Measurements: 4.1 x 2.0 x 2.8 cm. No mass or microlithiasis visualized. Right epididymis:  Normal in size and appearance. Left epididymis:  Normal in size and appearance. Hydrocele:  Small bilateral hydroceles. Varicocele:  None visualized. Pulsed Doppler interrogation of both testes demonstrates normal low resistance  arterial and venous waveforms bilaterally. IMPRESSION: 1. Normal ultrasound appearance of the bilateral testicles. Arterial and venous Doppler flow is present bilaterally. 2. Patient identified lump and palpable abnormality corresponds to the right epididymis, which is normal in ultrasound size and appearance. Correlate with urinalysis for evidence of epididymitis.  3. Small bilateral hydroceles. Electronically Signed   By: Lauralyn Primes M.D.   On: 05/22/2020 16:04    Procedures Procedures (including critical care time)  Medications Ordered in UC Medications - No data to display  Initial Impression / Assessment and Plan / UC Course  I have reviewed the triage vital signs and the nursing notes.  Pertinent labs & imaging results that were available during my care of the patient were reviewed by me and considered in my medical decision making (see chart for details).    39 year old male presents with right testicle pain. Ultrasound was completed. Results as above. The area of concern is at the location of the epididymis. There was no evidence of infection on ultrasound. However, given significant pain which has been unresolving I am treating him empirically for epididymitis. Treating with Levaquin.  Final Clinical Impressions(s) / UC Diagnoses   Final diagnoses:  Testicle pain  Epididymitis   Discharge Instructions   None    ED Prescriptions    Medication Sig Dispense Auth. Provider   levofloxacin (LEVAQUIN) 500 MG tablet Take 1 tablet (500 mg total) by mouth daily. 10 tablet Tommie Sams, DO     PDMP not reviewed this encounter.   Tommie Sams, [place] 05/22/20 1623

## 2020-05-22 NOTE — ED Triage Notes (Signed)
Pt presents to Urgent Care with c/o testicular pain x over a week. States he has had a knot on R testicle since having a vasectomy two years ago, but he has noticed it is growing in size. Pt reports he is also having groin pain and lower abdominal pain. Also c/o back pain. States PCP was supposed to be scheduling a testicular ultrasound, but he is "tired of waiting on her."

## 2020-07-13 ENCOUNTER — Ambulatory Visit: Payer: Medicaid Other | Admitting: Urology

## 2020-07-14 ENCOUNTER — Encounter: Payer: Self-pay | Admitting: Urology

## 2020-08-09 ENCOUNTER — Encounter: Payer: Self-pay | Admitting: Urology

## 2020-08-09 ENCOUNTER — Ambulatory Visit (INDEPENDENT_AMBULATORY_CARE_PROVIDER_SITE_OTHER): Payer: Medicaid Other | Admitting: Urology

## 2020-08-09 ENCOUNTER — Other Ambulatory Visit: Payer: Self-pay

## 2020-08-09 VITALS — BP 107/80 | HR 69 | Ht 73.0 in | Wt 161.0 lb

## 2020-08-09 DIAGNOSIS — R6882 Decreased libido: Secondary | ICD-10-CM

## 2020-08-09 DIAGNOSIS — E349 Endocrine disorder, unspecified: Secondary | ICD-10-CM

## 2020-08-09 DIAGNOSIS — N451 Epididymitis: Secondary | ICD-10-CM

## 2020-08-09 MED ORDER — ETODOLAC 400 MG PO TABS: 400.0000 mg | ORAL_TABLET | Freq: Two times a day (BID) | ORAL | 1 refills | Status: DC | PRN

## 2020-08-09 NOTE — Progress Notes (Signed)
08/09/2020 3:16 PM   Guy Rodriguez 09-06-1980 [id number]  Referring provider: No referring provider defined for this encounter.  Chief Complaint  Patient presents with  . Erectile Dysfunction    HPI: Guy Rodriguez is a 40 y.o. male who presents today for evaluation of chronic right hemiscrotal pain, ED and decreased libido   Primary complaint is right scrotal pain which has been intermittent since a vasectomy in 2018  Complains of intermittent pain and swelling of the right hemiscrotum  No precipitating, aggravating or alleviating factors.  Urgent care visit 05/22/2020 and scrotal ultrasound was performed which showed normal-appearing testes and epididymis bilaterally.  He also had a scrotal ultrasound April 2021 which showed enlargement and hypervascularity of the globus minor of the right epididymis  He does intermittently note a painful mass in the right hemiscrotum   Also complains of decreased libido for several years and feels his ED is related to this  Notes occasional painful ejaculation  Testosterone level was ordered by PCP but he has not had drawn yet   PMH: Past Medical History:  Diagnosis Date  . Cocaine abuse (HCC)   . ETOH abuse   . GERD (gastroesophageal reflux disease)   . History of methicillin resistant staphylococcus aureus (MRSA)   . History of participation in smoking cessation counseling   . Hypertension    H/O IN 2016-PCP TOOK PT OFF BP MED DUE TO BP CONTROLLED    Surgical History: Past Surgical History:  Procedure Laterality Date  . APPENDECTOMY    . BAND HEMORRHOIDECTOMY    . CARPAL TUNNEL RELEASE Right   . clavicale surgery Left   . VASECTOMY N/A 02/28/2017   Procedure: VASECTOMY;  Surgeon: Hildred Laser, MD;  Location: [facility];  Service: Urology;  Laterality: N/A;  . VASECTOMY Bilateral 03/28/2017   Procedure: VASECTOMY;  Surgeon: Hildred Laser, MD;  Location: [facility];  Service: Urology;  Laterality: Bilateral;     Home Medications:  Allergies as of 08/09/2020   No Known Allergies     Medication List       Accurate as of August 09, 2020  3:16 PM. If you have any questions, ask your nurse or doctor.        levofloxacin 500 MG tablet Commonly known as: LEVAQUIN Take 1 tablet (500 mg total) by mouth daily.   omeprazole 20 MG capsule Commonly known as: PRILOSEC Take 20 mg by mouth daily.   Suboxone 8-2 MG Film Generic drug: Buprenorphine HCl-Naloxone HCl Place under the tongue 2 (two) times daily.       Allergies: No Known Allergies  Family History: Family History  Problem Relation Age of Onset  . Prostate cancer Neg Hx   . Kidney cancer Neg Hx     Social History:  reports that he has been smoking cigarettes. He has a 6.50 pack-year smoking history. He has never used smokeless tobacco. He reports previous alcohol use. He reports previous drug use.   Physical Exam: BP 107/80   Pulse 69   Ht 6\' 1"  (1.854 m)   Wt 161 lb (73 kg)   BMI 21.24 kg/m   Constitutional:  Alert and oriented, No acute distress. HEENT: [place] AT, moist mucus membranes.  Trachea midline, no masses. Cardiovascular: No clubbing, cyanosis, or edema. Respiratory: Normal respiratory effort, no increased work of breathing. GU: Phallus circumcised without lesions, testes descended bilaterally; estimated volume 15 cc bilaterally; mild segmental enlargement of a portion of the globus minor of the right epididymis with  mild tenderness Lymph: No cervical or inguinal lymphadenopathy. Skin: No rashes, bruises or suspicious lesions. Neurologic: Grossly intact, no focal deficits, moving all 4 extremities. Psychiatric: Normal mood and affect.   Assessment & Plan:    1.  Postvasectomy epididymitis-recurrent  We discussed this occurs in approximately 1 in 500 patients who have had vasectomy.  It was stressed this is an inflammatory condition and noninfectious and would not recommend antibiotic treatment  Rx etodolac  400 mg twice daily as needed sent to pharmacy  2.  Decreased libido  Testosterone level drawn today  Follow-up 1 month for symptom reassessment   Riki Altes, MD  [facility] [address], [address] [place], [place] [address] [phone]

## 2020-08-10 LAB — TESTOSTERONE: Testosterone: 431 ng/dL (ref 264–916)

## 2020-08-11 ENCOUNTER — Encounter: Payer: Self-pay | Admitting: Urology

## 2020-08-11 ENCOUNTER — Telehealth: Payer: Self-pay | Admitting: *Deleted

## 2020-08-11 NOTE — Telephone Encounter (Signed)
-----   Message from Riki Altes, MD sent at 08/11/2020  2:24 PM EST ----- Testosterone level is normal at 431

## 2020-08-11 NOTE — Telephone Encounter (Signed)
Left message on voice mail per DPR .  °

## 2020-09-11 ENCOUNTER — Ambulatory Visit: Payer: Medicaid Other | Admitting: Urology

## 2020-09-12 ENCOUNTER — Other Ambulatory Visit: Payer: Self-pay

## 2020-09-12 ENCOUNTER — Ambulatory Visit
Admission: EM | Admit: 2020-09-12 | Discharge: 2020-09-12 | Disposition: A | Discharge status: Home / Self Care | Payer: Medicaid Other | Attending: Emergency Medicine | Admitting: Emergency Medicine

## 2020-09-12 ENCOUNTER — Encounter: Payer: Self-pay | Admitting: Urology

## 2020-09-12 ENCOUNTER — Ambulatory Visit (INDEPENDENT_AMBULATORY_CARE_PROVIDER_SITE_OTHER)
Admit: 2020-09-12 | Discharge: 2020-09-12 | Disposition: A | Discharge status: Home / Self Care | Payer: Medicaid Other | Attending: Emergency Medicine | Admitting: Emergency Medicine

## 2020-09-12 DIAGNOSIS — K449 Diaphragmatic hernia without obstruction or gangrene: Secondary | ICD-10-CM | POA: Diagnosis not present

## 2020-09-12 DIAGNOSIS — R1084 Generalized abdominal pain: Secondary | ICD-10-CM | POA: Insufficient documentation

## 2020-09-12 DIAGNOSIS — I700 Atherosclerosis of aorta: Secondary | ICD-10-CM | POA: Diagnosis not present

## 2020-09-12 LAB — COMPREHENSIVE METABOLIC PANEL
ALT: 14 U/L (ref 0–44)
AST: 25 U/L (ref 15–41)
Albumin: 3.7 g/dL (ref 3.5–5.0)
Alkaline Phosphatase: 44 U/L (ref 38–126)
Anion gap: 8 (ref 5–15)
BUN: 17 mg/dL (ref 6–20)
CO2: 26 mmol/L (ref 22–32)
Calcium: 9 mg/dL (ref 8.9–10.3)
Chloride: 105 mmol/L (ref 98–111)
Creatinine, Ser: 0.89 mg/dL (ref 0.61–1.24)
GFR, Estimated: >60 mL/min (ref >60)
Glucose, Bld: 88 mg/dL (ref 70–99)
Potassium: 4.5 mmol/L (ref 3.5–5.1)
Sodium: 139 mmol/L (ref 135–145)
Total Bilirubin: 1.5 mg/dL — ABNORMAL HIGH (ref 0.3–1.2)
Total Protein: 6.2 g/dL — ABNORMAL LOW (ref 6.5–8.1)

## 2020-09-12 LAB — CBC WITH DIFFERENTIAL/PLATELET
Abs Immature Granulocytes: 0.01 10*3/uL (ref 0.00–0.07)
Basophils Absolute: 0.1 10*3/uL (ref 0.0–0.1)
Basophils Relative: 1 %
Eosinophils Absolute: 0.5 10*3/uL (ref 0.0–0.5)
Eosinophils Relative: 9 %
HCT: 44.6 % (ref 39.0–52.0)
Hemoglobin: 15.5 g/dL (ref 13.0–17.0)
Immature Granulocytes: 0 %
Lymphocytes Relative: 38 %
Lymphs Abs: 2 10*3/uL (ref 0.7–4.0)
MCH: 31.4 pg (ref 26.0–34.0)
MCHC: 34.8 g/dL (ref 30.0–36.0)
MCV: 90.3 fL (ref 80.0–100.0)
Monocytes Absolute: 0.3 10*3/uL (ref 0.1–1.0)
Monocytes Relative: 6 %
Neutro Abs: 2.5 10*3/uL (ref 1.7–7.7)
Neutrophils Relative %: 46 %
Platelets: 209 10*3/uL (ref 150–400)
RBC: 4.94 MIL/uL (ref 4.22–5.81)
RDW: 12.7 % (ref 11.5–15.5)
WBC: 5.3 10*3/uL (ref 4.0–10.5)
nRBC: 0 % (ref 0.0–0.2)

## 2020-09-12 LAB — URINALYSIS, COMPLETE (UACMP) WITH MICROSCOPIC
Glucose, UA: NEGATIVE mg/dL
Hgb urine dipstick: NEGATIVE
Ketones, ur: 40 mg/dL — AB
Leukocytes,Ua: NEGATIVE
Nitrite: NEGATIVE
Protein, ur: NEGATIVE mg/dL
RBC / HPF: NONE SEEN RBC/hpf (ref 0–5)
Specific Gravity, Urine: 1.02 (ref 1.005–1.030)
pH: 8.5 — ABNORMAL HIGH (ref 5.0–8.0)

## 2020-09-12 LAB — LIPASE, BLOOD: Lipase: 21 U/L (ref 11–51)

## 2020-09-12 NOTE — ED Provider Notes (Signed)
[facility]    CSN: [id number] Arrival date & time: 09/12/20  1157      History   Chief Complaint Chief Complaint  Patient presents with  . Abdominal Pain    Upper left  . Rash    Upper cheeks, bilateral    HPI Guy Rodriguez is a 40 y.o. male.   HPI   40 year old male here for evaluation of abdominal pain.  Patient reports that he has been having left upper quadrant abdominal pain off and on for the last 2 weeks but it has worsened in the last week.  Patient reports that now radiates through to his back.  He has had a decreased appetite, fatigue, and increase in his urinary urgency and frequency.  Patient denies nausea, vomiting, diarrhea, constipation, blood in his stool, abdominal distention, pain with urination, penile discharge, low back pain, flulike symptoms, decrease in stream strength, or urinary incontinence.  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  Patient states he is been 17 months sober.  Patient does smoke marijuana.  Past Medical History:  Diagnosis Date  . Cocaine abuse (HCC)   . ETOH abuse   . GERD (gastroesophageal reflux disease)   . History of methicillin resistant staphylococcus aureus (MRSA)   . History of participation in smoking cessation counseling   . Hypertension    H/O IN 2016-PCP TOOK PT OFF BP MED DUE TO BP CONTROLLED    Patient Active Problem List   Diagnosis Date Noted  . Cellulitis 01/17/2019  . Leg pain 02/19/2017  . Varicose veins of leg with pain 12/26/2016  . Chronic venous insufficiency 12/26/2016  . Essential hypertension 12/26/2016  . GERD (gastroesophageal reflux disease) 12/26/2016  . Substance induced mood disorder (HCC) 02/14/2016  . Alcohol abuse 02/14/2016  . Cocaine abuse (HCC) 02/14/2016  . Moderate major depression (HCC) 02/14/2016    Past Surgical History:  Procedure Laterality Date   . APPENDECTOMY    . BAND HEMORRHOIDECTOMY    . CARPAL TUNNEL RELEASE Right   . clavicale surgery Left   . VASECTOMY N/A 02/28/2017   Procedure: VASECTOMY;  Surgeon: Hildred Laser, MD;  Location: [facility];  Service: Urology;  Laterality: N/A;  . VASECTOMY Bilateral 03/28/2017   Procedure: VASECTOMY;  Surgeon: Hildred Laser, MD;  Location: [facility];  Service: Urology;  Laterality: Bilateral;       Home Medications    Prior to Admission medications   Medication Sig Start Date End Date Taking? Authorizing Provider  omeprazole (PRILOSEC) 20 MG capsule Take 20 mg by mouth daily.  09/06/19  Yes [provider]  SUBOXONE 8-2 MG FILM Place under the tongue 2 (two) times daily.  10/28/19  Yes [provider]  pregabalin (LYRICA) 75 MG capsule Take 1 capsule (75 mg total) by mouth daily. Patient not taking: Reported on 02/14/2020 01/12/19 05/22/20  Georgiana Spinner, NP  ranitidine (ZANTAC) 150 MG tablet Take 150 mg by mouth 2 (two) times daily.  Patient not taking: Reported on 02/14/2020  05/22/20  [provider]    Family History Family History  Problem Relation Age of Onset  . Prostate cancer Neg Hx   . Kidney cancer Neg Hx     Social History Social History   Tobacco Use  . Smoking status: Current Every Day Smoker    Packs/day: 0.50    Years:  13.00    Pack years: 6.50    Types: Cigarettes  . Smokeless tobacco: Never Used  Vaping Use  . Vaping Use: Never used  Substance Use Topics  . Alcohol use: Not Currently    Comment: OCC  . Drug use: Not Currently     Allergies   Patient has no known allergies.   Review of Systems Review of Systems  Constitutional: Positive for appetite change and fatigue.  Gastrointestinal: Positive for abdominal pain. Negative for blood in stool, constipation, diarrhea, nausea and vomiting.  Genitourinary: Positive for frequency and urgency. Negative for dysuria, hematuria, penile discharge, penile pain and penile  swelling.  Musculoskeletal: Negative for back pain.  Skin: Negative for rash.  Hematological: Negative.   Psychiatric/Behavioral: Negative.      Physical Exam Triage Vital Signs ED Triage Vitals  Enc Vitals Group     BP 09/12/20 1213 107/71     Pulse Rate 09/12/20 1213 65     Resp 09/12/20 1213 18     Temp 09/12/20 1213 97.8 F (36.6 C)     Temp Source 09/12/20 1213 Oral     SpO2 09/12/20 1213 100 %     Weight 09/12/20 1210 165 lb (74.8 kg)     Height 09/12/20 1210 6\' 1"  (1.854 m)     Head Circumference --      Peak Flow --      Pain Score 09/12/20 1210 8     Pain Loc --      Pain Edu? --      Excl. in GC? --    No data found.  Updated Vital Signs BP 107/71 (BP Location: Left Arm)   Pulse 65   Temp 97.8 F (36.6 C) (Oral)   Resp 18   Ht 6\' 1"  (1.854 m)   Wt 165 lb (74.8 kg)   SpO2 100%   BMI 21.77 kg/m   Visual Acuity Right Eye Distance:   Left Eye Distance:   Bilateral Distance:    Right Eye Near:   Left Eye Near:    Bilateral Near:     Physical Exam Vitals and nursing note reviewed.  Constitutional:      General: He is not in acute distress.    Appearance: He is well-developed and normal weight. He is not ill-appearing.  HENT:     Head: Normocephalic and atraumatic.  Cardiovascular:     Rate and Rhythm: Normal rate and regular rhythm.     Heart sounds: Normal heart sounds. No murmur heard. No gallop.   Pulmonary:     Effort: Pulmonary effort is normal.     Breath sounds: Normal breath sounds. No wheezing, rhonchi or rales.  Abdominal:     General: Abdomen is flat. Bowel sounds are normal. There is no distension.     Palpations: Abdomen is soft. There is no hepatomegaly or splenomegaly.     Tenderness: There is abdominal tenderness in the right lower quadrant, left upper quadrant and left lower quadrant. There is no right CVA tenderness, left CVA tenderness, guarding or rebound. Negative signs include Murphy's sign and McBurney's sign.     Hernia:  No hernia is present.  Skin:    General: Skin is warm and dry.     Capillary Refill: Capillary refill takes less than 2 seconds.     Findings: No erythema or rash.  Neurological:     General: No focal deficit present.     Mental Status: He is alert and oriented to  person, place, and time.  Psychiatric:        Mood and Affect: Mood normal.        Behavior: Behavior normal.      UC Treatments / Results  Labs (all labs ordered are listed, but only abnormal results are displayed) Labs Reviewed  COMPREHENSIVE METABOLIC PANEL - Abnormal; Notable for the following components:      Result Value   Total Protein 6.2 (*)    Total Bilirubin 1.5 (*)    All other components within normal limits  URINALYSIS, COMPLETE (UACMP) WITH MICROSCOPIC - Abnormal; Notable for the following components:   APPearance CLOUDY (*)    pH 8.5 (*)    Bilirubin Urine SMALL (*)    Ketones, ur 40 (*)    Bacteria, UA FEW (*)    All other components within normal limits  CBC WITH DIFFERENTIAL/PLATELET  LIPASE, BLOOD    EKG   Radiology CT Renal Stone Study  Result Date: 09/12/2020 CLINICAL DATA:  Abdominal pain, primarily left-sided, with hematuria EXAM: CT ABDOMEN AND PELVIS WITHOUT CONTRAST TECHNIQUE: Multidetector CT imaging of the abdomen and pelvis was performed following the standard protocol without oral or IV contrast. COMPARISON:  August 07, 2018 FINDINGS: Lower chest: Lung bases are clear.  There is a small hiatal hernia. Hepatobiliary: No focal liver lesions are evident on this noncontrast enhanced study. Gallbladder wall is not appreciably thickened. There is no biliary duct dilatation. Pancreas: There is no evident pancreatic mass or inflammatory focus. Spleen: No splenic lesions are evident. Adrenals/Urinary Tract: Adrenals bilaterally appear unremarkable. There is no appreciable renal mass or hydronephrosis on either side. There is no appreciable renal or ureteral calculus on either side. Urinary  bladder is midline with wall thickness within normal limits. Stomach/Bowel: There is no appreciable bowel wall or mesenteric thickening. No evident bowel obstruction. The terminal ileum appears normal. There is no appreciable free air or portal venous air. Vascular/Lymphatic: No abdominal aortic aneurysm. There are foci of aortic and iliac artery atherosclerosis. There is no evident adenopathy in the abdomen or pelvis. Subcentimeter inguinal lymph nodes are noted, considered nonspecific. Reproductive: Prostate and seminal vesicles are normal in size and contour. Other: Appendix absent. No periappendiceal region inflammation evident. There is no abscess or ascites in the abdomen or pelvis. There is slight fat in the umbilicus. Musculoskeletal: There is broad-based disc bulging at L5-S1. Degenerative change with vacuum phenomenon noted at L5-S1. Mild degenerative changes noted in the lumbar spine. No blastic or lytic bone lesions. No intramuscular lesions are evident. IMPRESSION: 1. No appreciable renal or ureteral calculus. No hydronephrosis. Urinary bladder wall thickness is normal. A cause for hematuria has not been established with this study. 2. No bowel wall thickening or bowel obstruction. No abscess in the abdomen or pelvis. Appendix absent. 3.  Small hiatal hernia. 4.  Aortic Atherosclerosis (ICD10-I70.0). Electronically Signed   By: Bretta Bang III M.D.   On: 09/12/2020 14:07    Procedures Procedures (including critical care time)  Medications Ordered in UC Medications - No data to display  Initial Impression / Assessment and Plan / UC Course  I have reviewed the triage vital signs and the nursing notes.  Pertinent labs & imaging results that were available during my care of the patient were reviewed by me and considered in my medical decision making (see chart for details).   Is a very pleasant 40 year old male here for evaluation of left upper quadrant pain that radiates through to his  back.  Pain is been present for off and on for 2 weeks but it has worsened and become more constant in the last week.  Patient does have a history of alcoholism but he has been sober for 17 months.  Physical exam reveals pale conjunctiva but no scleral icterus.  Cardiopulmonary exam is unremarkable.  Abdominal exam reveals a flat abdomen that is soft with positive bowel sounds in all 4 quadrants.  Patient does display tenderness in the left upper quadrant, left lower quadrant, and right lower quadrant.  No hepato or splenomegaly appreciated on exam.  Patient has had increased urgency and frequency of urination but no pain or no penile discharge.  Differentials include urinary tract infection, prostatitis, and pancreatitis.  Will check CBC, CMP, lipase, and UA.  Patient CBC differential are unremarkable.  CMP shows a decreased total protein of 6.2 and an increased total bilirubin of 1.5.  Electrolytes, renal function, and remainder of transaminases are normal.  Urinalysis shows an increased pH of 8.5, small amount of bilirubin, ketones 40, few bacteria, and amorphous crystals present.  No leukocyte esterase, WBCs, RBCs, protein, or nitrites present.  Lipase is 21.  We will obtain CT the abdomen pelvis to evaluate for source of abdominal pain.  Radiology read of the CT is negative for acute intra-abdominal process.  Do not have a good reason for patient's symptoms.  We will have patient improve his hydration, try OTC Pepcid to see if that helps with his left upper quadrant pain, and follow-up with his PCP if his symptoms do not improve.       Final Clinical Impressions(s) / UC Diagnoses   Final diagnoses:  Generalized abdominal pain     Discharge Instructions     Your blood work and CT scan did not give any clear indication of why you are having your abdominal pain.  Your urine did have the presence of ketones which could indicate mild dehydration.  Recommend you increase your oral  fluid intake to help flush your system, increase your dietary intake which will help with your decreased total protein, and take Pepcid over-the-counter 20 mg twice daily to see if this helps with the left upper quadrant pain.  If your symptoms continue follow-up with your primary care provider.    ED Prescriptions    None     PDMP not reviewed this encounter.   Becky [place], NP 09/12/20 [phone]

## 2020-09-12 NOTE — ED Triage Notes (Addendum)
Pt c/o left-sided upper abdomen pain (under ribs), radiates to his back when driving. Pt reports pain present about a week and a half and increasing. Pt denies f/n/v/d, urinary, bowel or other symptoms. Pt also reports rash to upper cheeks, bilateral, denies itching/burning. Pt states he would get this rash when he was drinking heavily, has been sober 17 months now.

## 2020-09-12 NOTE — Discharge Instructions (Addendum)
Your blood work and CT scan did not give any clear indication of why you are having your abdominal pain.  Your urine did have the presence of ketones which could indicate mild dehydration.  Recommend you increase your oral fluid intake to help flush your system, increase your dietary intake which will help with your decreased total protein, and take Pepcid over-the-counter 20 mg twice daily to see if this helps with the left upper quadrant pain.  If your symptoms continue follow-up with your primary care provider.

## 2021-07-13 ENCOUNTER — Emergency Department
Admission: EM | Admit: 2021-07-13 | Discharge: 2021-07-13 | Discharge status: Against Medical Advice | Payer: Medicaid Other

## 2021-07-13 NOTE — ED Notes (Signed)
No answer when called for triage 

## 2021-07-13 NOTE — ED Notes (Signed)
Pt called for triage x1 

## 2022-01-29 NOTE — Congregational Nurse Program (Signed)
  Dept: [phone]   Congregational Nurse Program Note  Date of Encounter: 01/29/2022 Client new to clinic, in today with reports of nausea d/t not drinking alcohol in 3 days. Given a dose of pink bismuth. He is currently living in a camper with his wife., both are trying be sober and drug free.  They have no permanent place to park the camper and have been moving it around to different places. Encouraged client to return to clinic for vital sign checks when able. Past Medical History: Past Medical History:  Diagnosis Date   Cocaine abuse (HCC)    ETOH abuse    GERD (gastroesophageal reflux disease)    History of methicillin resistant staphylococcus aureus (MRSA)    History of participation in smoking cessation counseling    Hypertension    H/O IN 2016-PCP TOOK PT OFF BP MED DUE TO BP CONTROLLED    Encounter Details:  CNP Questionnaire - 01/29/22 1116       Questionnaire   Do you give verbal consent to treat you today? Yes    Location Patient Served  [facility]    Visit Setting Church or Organization    Patient Status Homeless   is currenlty living in his camper with his wife, with no permanent place to park it   [employer] Referral N/A    Medication N/A   not currenlty taking medications   Medical Provider No   uncertain at this time   Screening Referrals N/A    Medical Referral N/A   will discuss at next visit   Medical Appointment Made N/A    Food Have Food Insecurities    Transportation N/A   has a truck   Housing/Utilities No permanent housing    Interpersonal Safety N/A    Intervention Support    ED Visit Averted N/A    Life-Saving Intervention Made N/A

## 2022-02-06 ENCOUNTER — Emergency Department
Admission: EM | Admit: 2022-02-06 | Discharge: 2022-02-06 | Disposition: A | Discharge status: Home / Self Care | Payer: Medicaid Other | Attending: Emergency Medicine | Admitting: Emergency Medicine

## 2022-02-06 ENCOUNTER — Emergency Department: Payer: Medicaid Other

## 2022-02-06 ENCOUNTER — Encounter: Payer: Self-pay | Admitting: Emergency Medicine

## 2022-02-06 DIAGNOSIS — I8001 Phlebitis and thrombophlebitis of superficial vessels of right lower extremity: Secondary | ICD-10-CM | POA: Diagnosis not present

## 2022-02-06 DIAGNOSIS — M79604 Pain in right leg: Secondary | ICD-10-CM | POA: Diagnosis present

## 2022-02-06 DIAGNOSIS — I82811 Embolism and thrombosis of superficial veins of right lower extremities: Secondary | ICD-10-CM

## 2022-02-06 LAB — CBC WITH DIFFERENTIAL/PLATELET
Abs Immature Granulocytes: 0.02 10*3/uL (ref 0.00–0.07)
Basophils Absolute: 0.1 10*3/uL (ref 0.0–0.1)
Basophils Relative: 1 %
Eosinophils Absolute: 0.1 10*3/uL (ref 0.0–0.5)
Eosinophils Relative: 2 %
HCT: 40.1 % (ref 39.0–52.0)
Hemoglobin: 13.6 g/dL (ref 13.0–17.0)
Immature Granulocytes: 0 %
Lymphocytes Relative: 24 %
Lymphs Abs: 1.6 10*3/uL (ref 0.7–4.0)
MCH: 32 pg (ref 26.0–34.0)
MCHC: 33.9 g/dL (ref 30.0–36.0)
MCV: 94.4 fL (ref 80.0–100.0)
Monocytes Absolute: 0.7 10*3/uL (ref 0.1–1.0)
Monocytes Relative: 10 %
Neutro Abs: 4.4 10*3/uL (ref 1.7–7.7)
Neutrophils Relative %: 63 %
Platelets: 208 10*3/uL (ref 150–400)
RBC: 4.25 MIL/uL (ref 4.22–5.81)
RDW: 14.3 % (ref 11.5–15.5)
WBC: 6.9 10*3/uL (ref 4.0–10.5)
nRBC: 0 % (ref 0.0–0.2)

## 2022-02-06 LAB — D-DIMER, QUANTITATIVE: D-Dimer, Quant: 0.66 ug/mL-FEU — ABNORMAL HIGH (ref 0.00–0.50)

## 2022-02-06 LAB — COMPREHENSIVE METABOLIC PANEL
ALT: 58 U/L — ABNORMAL HIGH (ref 0–44)
AST: 45 U/L — ABNORMAL HIGH (ref 15–41)
Albumin: 3.9 g/dL (ref 3.5–5.0)
Alkaline Phosphatase: 89 U/L (ref 38–126)
Anion gap: 12 (ref 5–15)
BUN: 22 mg/dL — ABNORMAL HIGH (ref 6–20)
CO2: 20 mmol/L — ABNORMAL LOW (ref 22–32)
Calcium: 8.5 mg/dL — ABNORMAL LOW (ref 8.9–10.3)
Chloride: 107 mmol/L (ref 98–111)
Creatinine, Ser: 0.84 mg/dL (ref 0.61–1.24)
GFR, Estimated: >60 mL/min (ref >60)
Glucose, Bld: 119 mg/dL — ABNORMAL HIGH (ref 70–99)
Potassium: 2.9 mmol/L — ABNORMAL LOW (ref 3.5–5.1)
Sodium: 139 mmol/L (ref 135–145)
Total Bilirubin: 1.6 mg/dL — ABNORMAL HIGH (ref 0.3–1.2)
Total Protein: 7.1 g/dL (ref 6.5–8.1)

## 2022-02-06 LAB — TROPONIN I (HIGH SENSITIVITY): Troponin I (High Sensitivity): 5 ng/L (ref <18)

## 2022-02-06 MED ORDER — KETOROLAC TROMETHAMINE 30 MG/ML IJ SOLN
30.0000 mg | Freq: Once | INTRAMUSCULAR | Status: AC
Start: 2022-02-06 — End: 2022-02-06
  Administered 2022-02-06: 30 mg via INTRAMUSCULAR
  Filled 2022-02-06: qty 1

## 2022-02-06 MED ORDER — CEPHALEXIN 500 MG PO CAPS
500.0000 mg | ORAL_CAPSULE | Freq: Once | ORAL | Status: AC
  Administered 2022-02-06: 500 mg via ORAL
  Filled 2022-02-06: qty 1

## 2022-02-06 MED ORDER — CEPHALEXIN 500 MG PO CAPS: 500.0000 mg | ORAL_CAPSULE | Freq: Three times a day (TID) | ORAL | 0 refills | Status: AC

## 2022-02-06 MED ORDER — ACETAMINOPHEN 500 MG PO TABS
1000.0000 mg | ORAL_TABLET | Freq: Once | ORAL | Status: AC
  Administered 2022-02-06: 1000 mg via ORAL
  Filled 2022-02-06: qty 2

## 2022-02-06 MED ORDER — OXYCODONE HCL 5 MG PO TABS
5.0000 mg | ORAL_TABLET | Freq: Once | ORAL | Status: AC
  Administered 2022-02-06: 5 mg via ORAL
  Filled 2022-02-06: qty 1

## 2022-02-06 NOTE — ED Triage Notes (Addendum)
Pt presents via POV with complaints of right leg swelling that started 4 days ago with associated neck pain. Notable erythema & edema to the right posterior calf in comparison to left. Hx of DVT on the left. Denies CP or SOB.

## 2022-02-06 NOTE — ED Notes (Signed)
Pt to stat desk inquiring about wait time. Pt updated on wait time and given two warm blankets per request.

## 2022-02-06 NOTE — ED Provider Notes (Signed)
[facility] Provider Note    Event Date/Time   First MD Initiated Contact with Patient 02/06/22 [phone]     (approximate)   History   Leg Swelling   HPI  Guy Rodriguez is a 41 y.o. male who presents to the ED for evaluation of Leg Swelling   I reviewed PCP visit from December.  History of polysubstance abuse.  History of varicose veins.  Patient presents to the ED for the ration of right calf pain and swelling.  Reports atraumatic pain over the past 2-3 days to his right calf with associated swelling to his leg distal to this.  Reports concern for a blood clot.  Denies chest pain, fever, shortness of breath, syncope, back pain   Physical Exam   Triage Vital Signs: ED Triage Vitals  Enc Vitals Group     BP 02/06/22 0036 (!) 156/93     Pulse Rate 02/06/22 0036 80     Resp 02/06/22 0036 18     Temp 02/06/22 0036 98.6 F (37 C)     Temp Source 02/06/22 0036 Oral     SpO2 02/06/22 0036 98 %     Weight 02/06/22 0034 185 lb (83.9 kg)     Height 02/06/22 0034 6\' 1"  (1.854 m)     Head Circumference --      Peak Flow --      Pain Score 02/06/22 0034 7     Pain Loc --      Pain Edu? --      Excl. in GC? --     Most recent vital signs: Vitals:   02/06/22 0500 02/06/22 0527  BP: 111/78 124/88  Pulse: (!) 50 (!) 57  Resp: 16 20  Temp:  97.7 F (36.5 C)  SpO2: 99% 97%    General: Awake, no distress.  CV:  Good peripheral perfusion.  Resp:  Normal effort.  Abd:  No distention.  MSK:  Engorged varicose veins to the area in question to his posterior right calf.  Associated erythema and induration without purulence or anything coming to a head.  Right distal leg is edematous with pitting edema that is asymmetric when compared to the left.  Tender to palpation.  Right foot is distally neurovascularly intact. Neuro:  No focal deficits appreciated. Other:     ED Results / Procedures / Treatments   Labs (all labs ordered are listed, but only  abnormal results are displayed) Labs Reviewed  COMPREHENSIVE METABOLIC PANEL - Abnormal; Notable for the following components:      Result Value   Potassium 2.9 (*)    CO2 20 (*)    Glucose, Bld 119 (*)    BUN 22 (*)    Calcium 8.5 (*)    AST 45 (*)    ALT 58 (*)    Total Bilirubin 1.6 (*)    All other components within normal limits  D-DIMER, QUANTITATIVE - Abnormal; Notable for the following components:   D-Dimer, Quant 0.66 (*)    All other components within normal limits  CBC WITH DIFFERENTIAL/PLATELET  TROPONIN I (HIGH SENSITIVITY)  TROPONIN I (HIGH SENSITIVITY)    EKG   RADIOLOGY CXR interpreted by me without evidence of acute cardiopulmonary pathology. Venous ultrasound of the right leg without evidence of DVT, but thrombosed superficial varicose veins are noted  Official radiology report(s): 04/08/22 Venous Img Lower Unilateral Right  Result Date: 02/06/2022 CLINICAL DATA:  Right leg swelling for 4 days EXAM: RIGHT LOWER EXTREMITY  VENOUS DOPPLER ULTRASOUND TECHNIQUE: Gray-scale sonography with compression, as well as color and duplex ultrasound, were performed to evaluate the deep venous system(s) from the level of the common femoral vein through the popliteal and proximal calf veins. COMPARISON:  05/09/2017 FINDINGS: VENOUS Normal compressibility of the common femoral, superficial femoral, and popliteal veins, as well as the visualized calf veins. Visualized portions of profunda femoral vein and great saphenous vein unremarkable. No filling defects to suggest DVT on grayscale or color Doppler imaging. Doppler waveforms show normal direction of venous flow, normal respiratory plasticity and response to augmentation. Limited views of the contralateral common femoral vein are unremarkable. OTHER At the area of pain there is subcutaneous dilated and tortuous veins with areas of occlusive thrombus. IMPRESSION: Thrombosed subcutaneous varicosities in the right calf. Negative for DVT.  Electronically Signed   By: Tiburcio Pea M.D.   On: 02/06/2022 04:50   DG Chest 2 View  Result Date: 02/06/2022 CLINICAL DATA:  Weakness. EXAM: CHEST - 2 VIEW COMPARISON:  Chest radiograph dated 08/04/2017. FINDINGS: The heart size and mediastinal contours are within normal limits. Both lungs are clear. The visualized skeletal structures are unremarkable. IMPRESSION: No active cardiopulmonary disease. Electronically Signed   By: Elgie Collard M.D.   On: 02/06/2022 01:10    PROCEDURES and INTERVENTIONS:  Procedures  Medications  acetaminophen (TYLENOL) tablet 1,000 mg (1,000 mg Oral Given 02/06/22 0405)  oxyCODONE (Oxy IR/ROXICODONE) immediate release tablet 5 mg (5 mg Oral Given 02/06/22 0405)  ketorolac (TORADOL) 30 MG/ML injection 30 mg (30 mg Intramuscular Given 02/06/22 0517)  cephALEXin (KEFLEX) capsule 500 mg (500 mg Oral Given 02/06/22 0518)     IMPRESSION / MDM / ASSESSMENT AND PLAN / ED COURSE  I reviewed the triage vital signs and the nursing notes.  Differential diagnosis includes, but is not limited to, DVT, SVT, superficial thrombophlebitis, cellulitis, abscess  41 year old male presents to the ED with evidence of superficial venous thrombosis, with thrombosed varicose veins, suitable for outpatient management with vascular surgery follow-up.  The superimposed erythema and induration certainly could be thrombophlebitis, but we discussed the possibility of superimposed cellulitis and after discussing risks and benefits we decided to pursue a few days of antibiotics to treat cellulitis.  Venous ultrasound without signs of DVT, and does show stigmata of thrombosis within varicose veins.  Blood work otherwise with mild hypokalemia.  No hematologic derangements.  D-dimer sent from triage for unknown reasons prior to my evaluation of the patient and this is somewhat positive.  He has no chest pain, shortness of breath or symptoms to suggest PE and I do not believe CTA chest is prudent at  this time considering his lack of cardiopulmonary symptoms. We discussed NSAIDs, warm compresses and compression stockings alongside his Keflex antibiotics.  Provided referral to vascular surgery and discussed return precautions for the ED.      FINAL CLINICAL IMPRESSION(S) / ED DIAGNOSES   Final diagnoses:  Thrombophlebitis of superficial veins of right lower extremity  Acute superficial venous thrombosis of lower extremity, right     Rx / DC Orders   ED Discharge Orders          Ordered    cephALEXin (KEFLEX) 500 MG capsule  3 times daily        02/06/22 0514             Note:  This document was prepared using Dragon voice recognition software and may include unintentional dictation errors.   Delton Prairie, MD 02/06/22 [phone]

## 2022-02-06 NOTE — Discharge Instructions (Addendum)
Please take Tylenol and ibuprofen/Advil for your pain.  It is safe to take them together, or to alternate them every few hours.  Take up to 1000mg  of Tylenol at a time, up to 4 times per day.  Do not take more than 4000 mg of Tylenol in 24 hours.  For ibuprofen, take 400-600 mg, 3 - 4 times per day.  Use warm compresses to the area  Use compression socks or tight stockings  You are being discharged with a few days of antibiotics to treat the possibility of skin infection.  Please finish all pills.  You can reach out to the vascular surgery clinic to discuss more definitive treatment options

## 2022-02-12 ENCOUNTER — Telehealth (INDEPENDENT_AMBULATORY_CARE_PROVIDER_SITE_OTHER): Payer: Self-pay | Admitting: Vascular Surgery

## 2022-02-12 NOTE — Telephone Encounter (Signed)
Patient called and left a voice message stating he has a large mass on right calf and would like an appointment.  Please advise

## 2022-02-12 NOTE — Telephone Encounter (Signed)
The patient has a superficial thrombophlebitis.  Typically these are red and swollen and painful until they start to resolve.  The patient should begin taking a baby aspirin daily in addition to using NSAIDs for pain control.  He can also use warm compresses.  He should also elevate the lower extremity.  Will have the patient return in 1 to 2 weeks for repeat DVT study.  He can see myself or Schnier

## 2022-02-12 NOTE — Telephone Encounter (Signed)
Return patient call to this number [phone].

## 2022-02-12 NOTE — Telephone Encounter (Signed)
Patient has had the mass for 7 days,was seen at the ER on 02/06/22, and was placed on antibiotic. The patient informed that from his knee down is swollen,sore,and his toe feel like its on fire, Please Advise

## 2022-02-13 NOTE — Telephone Encounter (Signed)
Reach out to the patient but was not able to leave a message 

## 2022-02-13 NOTE — Telephone Encounter (Signed)
Patient was made aware with medical recommendations and verbalized understanding. Patient appointment has been scheduled.

## 2022-02-19 ENCOUNTER — Emergency Department: Payer: Medicaid Other

## 2022-02-19 ENCOUNTER — Other Ambulatory Visit (INDEPENDENT_AMBULATORY_CARE_PROVIDER_SITE_OTHER): Payer: Self-pay | Admitting: Nurse Practitioner

## 2022-02-19 ENCOUNTER — Encounter: Payer: Self-pay | Admitting: Emergency Medicine

## 2022-02-19 ENCOUNTER — Other Ambulatory Visit: Payer: Self-pay

## 2022-02-19 ENCOUNTER — Emergency Department
Admission: EM | Admit: 2022-02-19 | Discharge: 2022-02-20 | Disposition: A | Discharge status: Home / Self Care | Payer: Medicaid Other | Attending: Emergency Medicine | Admitting: Emergency Medicine

## 2022-02-19 DIAGNOSIS — Z789 Other specified health status: Secondary | ICD-10-CM

## 2022-02-19 DIAGNOSIS — I8001 Phlebitis and thrombophlebitis of superficial vessels of right lower extremity: Secondary | ICD-10-CM

## 2022-02-19 DIAGNOSIS — S61412A Laceration without foreign body of left hand, initial encounter: Secondary | ICD-10-CM | POA: Insufficient documentation

## 2022-02-19 DIAGNOSIS — X781XXA Intentional self-harm by knife, initial encounter: Secondary | ICD-10-CM | POA: Diagnosis not present

## 2022-02-19 DIAGNOSIS — Y906 Blood alcohol level of 120-199 mg/100 ml: Secondary | ICD-10-CM | POA: Diagnosis not present

## 2022-02-19 DIAGNOSIS — Z20822 Contact with and (suspected) exposure to covid-19: Secondary | ICD-10-CM | POA: Insufficient documentation

## 2022-02-19 DIAGNOSIS — I10 Essential (primary) hypertension: Secondary | ICD-10-CM | POA: Insufficient documentation

## 2022-02-19 DIAGNOSIS — F1010 Alcohol abuse, uncomplicated: Secondary | ICD-10-CM | POA: Insufficient documentation

## 2022-02-19 DIAGNOSIS — S6992XA Unspecified injury of left wrist, hand and finger(s), initial encounter: Secondary | ICD-10-CM | POA: Diagnosis present

## 2022-02-19 DIAGNOSIS — F17210 Nicotine dependence, cigarettes, uncomplicated: Secondary | ICD-10-CM | POA: Insufficient documentation

## 2022-02-19 DIAGNOSIS — R45851 Suicidal ideations: Secondary | ICD-10-CM

## 2022-02-19 DIAGNOSIS — E876 Hypokalemia: Secondary | ICD-10-CM | POA: Diagnosis not present

## 2022-02-19 DIAGNOSIS — J020 Streptococcal pharyngitis: Secondary | ICD-10-CM | POA: Insufficient documentation

## 2022-02-19 LAB — URINE DRUG SCREEN, QUALITATIVE (ARMC ONLY)
Amphetamines, Ur Screen: NOT DETECTED
Barbiturates, Ur Screen: NOT DETECTED
Benzodiazepine, Ur Scrn: NOT DETECTED
Cannabinoid 50 Ng, Ur ~~LOC~~: POSITIVE — AB
Cocaine Metabolite,Ur ~~LOC~~: POSITIVE — AB
MDMA (Ecstasy)Ur Screen: NOT DETECTED
Methadone Scn, Ur: NOT DETECTED
Opiate, Ur Screen: NOT DETECTED
Phencyclidine (PCP) Ur S: NOT DETECTED
Tricyclic, Ur Screen: NOT DETECTED

## 2022-02-19 LAB — URINALYSIS, ROUTINE W REFLEX MICROSCOPIC
Bacteria, UA: NONE SEEN
Bilirubin Urine: NEGATIVE
Glucose, UA: NEGATIVE mg/dL
Hgb urine dipstick: NEGATIVE
Ketones, ur: NEGATIVE mg/dL
Leukocytes,Ua: NEGATIVE
Nitrite: NEGATIVE
Protein, ur: 30 mg/dL — AB
Specific Gravity, Urine: 1.018 (ref 1.005–1.030)
pH: 6 (ref 5.0–8.0)

## 2022-02-19 LAB — CBC
HCT: 48.2 % (ref 39.0–52.0)
Hemoglobin: 16 g/dL (ref 13.0–17.0)
MCH: 32.1 pg (ref 26.0–34.0)
MCHC: 33.2 g/dL (ref 30.0–36.0)
MCV: 96.8 fL (ref 80.0–100.0)
Platelets: 311 10*3/uL (ref 150–400)
RBC: 4.98 MIL/uL (ref 4.22–5.81)
RDW: 14.7 % (ref 11.5–15.5)
WBC: 11.2 10*3/uL — ABNORMAL HIGH (ref 4.0–10.5)
nRBC: 0 % (ref 0.0–0.2)

## 2022-02-19 LAB — COMPREHENSIVE METABOLIC PANEL
ALT: 31 U/L (ref 0–44)
AST: 37 U/L (ref 15–41)
Albumin: 3.9 g/dL (ref 3.5–5.0)
Alkaline Phosphatase: 106 U/L (ref 38–126)
Anion gap: 11 (ref 5–15)
BUN: 12 mg/dL (ref 6–20)
CO2: 23 mmol/L (ref 22–32)
Calcium: 8.5 mg/dL — ABNORMAL LOW (ref 8.9–10.3)
Chloride: 105 mmol/L (ref 98–111)
Creatinine, Ser: 0.81 mg/dL (ref 0.61–1.24)
GFR, Estimated: >60 mL/min (ref >60)
Glucose, Bld: 118 mg/dL — ABNORMAL HIGH (ref 70–99)
Potassium: 3 mmol/L — ABNORMAL LOW (ref 3.5–5.1)
Sodium: 139 mmol/L (ref 135–145)
Total Bilirubin: 0.7 mg/dL (ref 0.3–1.2)
Total Protein: 7.3 g/dL (ref 6.5–8.1)

## 2022-02-19 LAB — ACETAMINOPHEN LEVEL: Acetaminophen (Tylenol), Serum: <10 ug/mL — ABNORMAL LOW (ref 10–30)

## 2022-02-19 LAB — SARS CORONAVIRUS 2 BY RT PCR: SARS Coronavirus 2 by RT PCR: NEGATIVE

## 2022-02-19 LAB — SALICYLATE LEVEL: Salicylate Lvl: <7 mg/dL — ABNORMAL LOW (ref 7.0–30.0)

## 2022-02-19 LAB — ETHANOL: Alcohol, Ethyl (B): 127 mg/dL — ABNORMAL HIGH (ref <10)

## 2022-02-19 MED ORDER — THIAMINE HCL 100 MG PO TABS
100.0000 mg | ORAL_TABLET | Freq: Every day | ORAL | Status: DC
  Administered 2022-02-19 – 2022-02-20 (×2): 100 mg via ORAL
  Filled 2022-02-19 (×2): qty 1

## 2022-02-19 MED ORDER — POTASSIUM CHLORIDE CRYS ER 20 MEQ PO TBCR
40.0000 meq | EXTENDED_RELEASE_TABLET | Freq: Once | ORAL | Status: AC
  Administered 2022-02-19: 40 meq via ORAL
  Filled 2022-02-19: qty 2

## 2022-02-19 MED ORDER — LORAZEPAM 2 MG PO TABS: 0.0000 mg | ORAL_TABLET | Freq: Two times a day (BID) | ORAL | Status: DC

## 2022-02-19 MED ORDER — LORAZEPAM 2 MG/ML IJ SOLN: 0.0000 mg | Freq: Four times a day (QID) | INTRAMUSCULAR | Status: DC

## 2022-02-19 MED ORDER — ACETAMINOPHEN 500 MG PO TABS
1000.0000 mg | ORAL_TABLET | Freq: Once | ORAL | Status: AC
  Administered 2022-02-19: 1000 mg via ORAL
  Filled 2022-02-19: qty 2

## 2022-02-19 MED ORDER — THIAMINE HCL 100 MG/ML IJ SOLN: 100.0000 mg | Freq: Every day | INTRAMUSCULAR | Status: DC

## 2022-02-19 MED ORDER — LORAZEPAM 2 MG PO TABS
0.0000 mg | ORAL_TABLET | Freq: Four times a day (QID) | ORAL | Status: DC
  Administered 2022-02-19 – 2022-02-20 (×2): 2 mg via ORAL
  Filled 2022-02-19 (×2): qty 1

## 2022-02-19 MED ORDER — LORAZEPAM 2 MG/ML IJ SOLN: 0.0000 mg | Freq: Two times a day (BID) | INTRAMUSCULAR | Status: DC

## 2022-02-19 NOTE — ED Notes (Signed)
Pt IVC pending consult

## 2022-02-19 NOTE — ED Triage Notes (Signed)
Pt via [facility] from [facility] under IVC. Pt states he is suicidal, states he also wanted to detox off of crack and alcohol, last used was around 2:00pm today. Denies HI. Denies AVH. Pt is calm and cooperative but anxious and tearful on arrival.  Per IVC paperwork, pt is SI and HI. States that the client has not eaten in past 3 days. States that "it will not be good" if he goes back home.

## 2022-02-19 NOTE — ED Notes (Signed)
RN introduced self to pt. Pt reports last alcohol and crack cocaine use today at 2pm. Pt reports he doesn't want to live like this anymore and went to [facility]. Pt placed under IVC for SI. Pt tearful and requesting help with detox.

## 2022-02-19 NOTE — ED Provider Notes (Signed)
[facility] Provider Note    Event Date/Time   First MD Initiated Contact with Patient 02/19/22 1757     (approximate)   History   Chief Complaint Suicidal   HPI  Guy Rodriguez is a 41 y.o. male with past medical history of hypertension, polysubstance abuse, and alcohol abuse who presents to the ED complaining of suicidal ideation.  Patient reports that he has been dealing with intermittent thoughts of suicide over the past few days after he recently relapsed into alcohol and drug use.  He is states that he has been smoking crack cocaine as well as drinking alcohol on a regular basis.  He drinks about 6-10 16 ounce beers daily, that he states contain 12% alcohol.  His last drink was around 2 PM this afternoon and he is now concerned he is developing withdrawal symptoms.  He denies any IV drug use.  He states he has been having thoughts about cutting himself and stabbed his left hand with a knife last night.  He reports 2 small wounds to his left hand along with pain in this area.     Physical Exam   Triage Vital Signs: ED Triage Vitals  Enc Vitals Group     BP 02/19/22 1744 (!) 140/94     Pulse Rate 02/19/22 1744 97     Resp 02/19/22 1744 20     Temp 02/19/22 1744 98.5 F (36.9 C)     Temp src --      SpO2 02/19/22 1744 96 %     Weight 02/19/22 1742 160 lb (72.6 kg)     Height 02/19/22 1742 6\' 1"  (1.854 m)     Head Circumference --      Peak Flow --      Pain Score 02/19/22 1742 8     Pain Loc --      Pain Edu? --      Excl. in GC? --     Most recent vital signs: Vitals:   02/19/22 1744  BP: (!) 140/94  Pulse: 97  Resp: 20  Temp: 98.5 F (36.9 C)  SpO2: 96%    Constitutional: Alert and oriented. Eyes: Conjunctivae are normal. Head: Atraumatic. Nose: No congestion/rhinnorhea. Mouth/Throat: Mucous membranes are moist.  Cardiovascular: Normal rate, regular rhythm. Grossly normal heart sounds.  2+ radial pulses  bilaterally. Respiratory: Normal respiratory effort.  No retractions. Lungs CTAB. Gastrointestinal: Soft and nontender. No distention. Musculoskeletal: No lower extremity tenderness nor edema.  Two small healing superficial lacerations to ventral surface of left hand with no erythema, edema, or purulence. Neurologic:  Normal speech and language. No gross focal neurologic deficits are appreciated.    ED Results / Procedures / Treatments   Labs (all labs ordered are listed, but only abnormal results are displayed) Labs Reviewed  COMPREHENSIVE METABOLIC PANEL - Abnormal; Notable for the following components:      Result Value   Potassium 3.0 (*)    Glucose, Bld 118 (*)    Calcium 8.5 (*)    All other components within normal limits  ETHANOL - Abnormal; Notable for the following components:   Alcohol, Ethyl (B) 127 (*)    All other components within normal limits  SALICYLATE LEVEL - Abnormal; Notable for the following components:   Salicylate Lvl <7.0 (*)    All other components within normal limits  ACETAMINOPHEN LEVEL - Abnormal; Notable for the following components:   Acetaminophen (Tylenol), Serum <10 (*)    All other components  within normal limits  CBC - Abnormal; Notable for the following components:   WBC 11.2 (*)    All other components within normal limits  URINE DRUG SCREEN, QUALITATIVE ([facility] ONLY) - Abnormal; Notable for the following components:   Cocaine Metabolite,Ur [place] POSITIVE (*)    Cannabinoid 50 Ng, Ur [place] POSITIVE (*)    All other components within normal limits   RADIOLOGY Left hand x-ray reviewed and interpreted by me with no fracture, dislocation, or foreign body.  PROCEDURES:  Critical Care performed: No  Procedures   MEDICATIONS ORDERED IN ED: Medications  LORazepam (ATIVAN) injection 0-4 mg (has no administration in time range)    Or  LORazepam (ATIVAN) tablet 0-4 mg (has no administration in time range)  LORazepam (ATIVAN) injection 0-4 mg  (has no administration in time range)    Or  LORazepam (ATIVAN) tablet 0-4 mg (has no administration in time range)  thiamine (VITAMIN B1) tablet 100 mg (has no administration in time range)    Or  thiamine (VITAMIN B1) injection 100 mg (has no administration in time range)  potassium chloride SA (KLOR-CON M) CR tablet 40 mEq (40 mEq Oral Given 02/19/22 1846)     IMPRESSION / MDM / ASSESSMENT AND PLAN / ED COURSE  I reviewed the triage vital signs and the nursing notes.                              41 y.o. male with past medical history of hypertension, alcohol abuse, polysubstance abuse, and GERD who presents to the ED with increasing thoughts of suicide over the past 2 days after he recently relapsed on alcohol and cocaine.  Patient's presentation is most consistent with acute presentation with potential threat to life or bodily function.  Differential diagnosis includes, but is not limited to, suicidal ideation, homicidal ideation, psychosis, hand laceration, foreign body, cellulitis, alcohol withdrawal.  Patient nontoxic-appearing and in no acute distress, vital signs are unremarkable.  He is concerned he is developing alcohol withdrawal symptoms but symptoms seem relatively minimal at this time with reassuring vital signs.  We will start him on CIWA protocol but no indication for medical admission for alcohol withdrawal.  We will further assess with x-ray of left hand to ensure no foreign body with self-inflicted laceration from last night.  No signs of developing infection to his left hand.  Screening labs are unremarkable with no significant leukocytosis, anemia, or AKI.  He does have mild hypokalemia that was repleted along with mildly elevated ethanol level.  Patient may be medically cleared for psychiatric disposition, was placed under IVC prior to arrival at [facility].  Left hand x-ray is unremarkable with no evidence of traumatic injury or foreign body.  The patient has been placed in  psychiatric observation due to the need to provide a safe environment for the patient while obtaining psychiatric consultation and evaluation, as well as ongoing medical and medication management to treat the patient's condition.  The patient has been placed under full IVC at this time.       FINAL CLINICAL IMPRESSION(S) / ED DIAGNOSES   Final diagnoses:  Suicidal ideation  Laceration of left hand without foreign body, initial encounter     Rx / DC Orders   ED Discharge Orders     None        Note:  This document was prepared using Dragon voice recognition software and may include unintentional dictation errors.  Chesley Noon, MD 02/19/22 1919

## 2022-02-19 NOTE — ED Notes (Signed)
Pt returned from X-ray.  

## 2022-02-19 NOTE — ED Notes (Addendum)
Pt in Xray with security escort.

## 2022-02-19 NOTE — ED Notes (Signed)
Report received from [place], English as a second language teacher. Patient alert and oriented, warm and dry, and in no acute distress. Patient denies SI (now), HI, AVH and pain. Patient made aware of Q15 minute rounds and Psychologist, counselling presence for their safety. Patient instructed to come to this nurse with needs or concerns.

## 2022-02-19 NOTE — ED Notes (Signed)
Pt given nighttime snack. 

## 2022-02-19 NOTE — ED Notes (Signed)
Security called to escort pt to xray

## 2022-02-19 NOTE — ED Notes (Addendum)
Belongings include (2):  1 pair of white shoes 1 blue shirt 1 gray shorts  1 black underwear 1 multi-colored bag  1 silver colored ring  Per pt, wife is supposed to be on the way to pick up his cell phone. Cell phone placed in the multi-colored bag

## 2022-02-20 ENCOUNTER — Encounter (INDEPENDENT_AMBULATORY_CARE_PROVIDER_SITE_OTHER): Payer: Medicaid Other

## 2022-02-20 DIAGNOSIS — F1010 Alcohol abuse, uncomplicated: Secondary | ICD-10-CM

## 2022-02-20 LAB — GROUP A STREP BY PCR: Group A Strep by PCR: DETECTED — AB

## 2022-02-20 MED ORDER — CHLORDIAZEPOXIDE HCL 25 MG PO CAPS
50.0000 mg | ORAL_CAPSULE | Freq: Once | ORAL | Status: AC
  Administered 2022-02-20: 50 mg via ORAL
  Filled 2022-02-20: qty 2

## 2022-02-20 MED ORDER — PENICILLIN G BENZATHINE 600000 UNIT/ML IM SUSY
600000.0000 [IU] | PREFILLED_SYRINGE | Freq: Once | INTRAMUSCULAR | Status: AC
  Administered 2022-02-20: 600000 [IU] via INTRAMUSCULAR
  Filled 2022-02-20 (×2): qty 1

## 2022-02-20 MED ORDER — CHLORDIAZEPOXIDE HCL 25 MG PO CAPS: 50.0000 mg | ORAL_CAPSULE | ORAL | 0 refills | Status: DC

## 2022-02-20 MED ORDER — ACETAMINOPHEN 325 MG PO TABS
650.0000 mg | ORAL_TABLET | Freq: Once | ORAL | Status: AC
  Administered 2022-02-20: 650 mg via ORAL
  Filled 2022-02-20: qty 2

## 2022-02-20 NOTE — ED Notes (Addendum)
Will try for a temp at a later time. Pt had been drinking a cold beverage and then was in the shower.

## 2022-02-20 NOTE — ED Notes (Signed)
IVC rescinded per NP Barthold 

## 2022-02-20 NOTE — ED Notes (Signed)
ED Provider at bedside. 

## 2022-02-20 NOTE — ED Notes (Signed)
Ivc /psych cosult pending

## 2022-02-20 NOTE — Discharge Instructions (Addendum)
Take librium as prescribed.  Take tylenol 650 mg and ibuprofen 400 mg every 6 hours for strep throat pain.

## 2022-02-20 NOTE — BH Assessment (Signed)
Attempted to assess patient but patient continues to be under the influence, psyc team will attempt at a later time.

## 2022-02-20 NOTE — ED Provider Notes (Signed)
The patient has been evaluated at bedside by psychiatry.  Patient is clinically stable.  Not felt to be a danger to self or others.  No SI or Hi.  No indication for inpatient psychiatric admission at this time.  Appropriate for continued outpatient therapy.  Pt with fever and sore throat, no masses rigidity on my examination.  Erythema.  Strep positive.  Given IM penicillin.  Pt concerned for alcohol withdrawals, not in acute alcohol withdrawal on my assessment, mild tremors only, hemodynamics reassuring, offered Librium and Librium taper outpatient.  Safe for discharge home with alcohol use resources, Librium taper, pain management for strep.  Return precautions given  Pilar Jarvis MD   Pilar Jarvis, MD 02/20/22 [phone]

## 2022-02-20 NOTE — ED Provider Notes (Signed)
Emergency Medicine Observation Re-evaluation Note  Guy Rodriguez is a 41 y.o. male, seen on rounds today.  Pt initially presented to the ED for complaints of Suicidal  Currently, the patient is is no acute distress. Denies any concerns at this time.  Physical Exam  Blood pressure (!) 137/95, pulse 97, temperature 99 F (37.2 C), temperature source Oral, resp. rate 17, height 6\' 1"  (1.854 m), weight 72.6 kg, SpO2 95 %.  Physical Exam: General: No apparent distress Pulm: Normal WOB Neuro: Moving all extremities Psych: Resting comfortably     ED Course / MDM     I have reviewed the labs performed to date as well as medications administered while in observation.  Recent changes in the last 24 hours include: No acute events overnight.  Plan   Current plan: Patient awaiting psychiatric disposition. Patient is under full IVC at this time.    Guy Rodriguez, , DO 02/20/22 [phone]

## 2022-02-20 NOTE — ED Notes (Signed)
When pt sat up to take po med, a vape was seen under his person. Pt educated that this is not allowed in ED and he is not allowed personal belongings. Pt hands to nurse, this was place in his personal belonging bag 1 of 2

## 2022-02-20 NOTE — Consult Note (Signed)
[facility] Face-to-Face Psychiat[facility] Consult   Reason for Consult:  substance abuse Referring Physician:  Modesto CharonWong Patient Identification: Guy Rodriguez MRN:  [id number] Principal Diagnosis: Alcohol abuse Diagnosis:  Principal Problem:   Alcohol abuse   Total Time spent with patient: 45 minutes  Subjective:   Guy Rodriguez is a 41 y.o. male patient admitted with alcohol intoxication. BAL 127.  UDS positive for cocaine and cannabinoids HPI: Patient presented to the ED under involuntary commitment after [facility].  On evaluation this morning, patient is cooperative.  Calm.  He denies that he ever had any suicidal thoughts when he went to [facility] and denies current suicidal thoughts.  He denies homicidal ideations, paranoia, auditory or visual hallucinations.  Patient does not appear to be responding to internal stimuli.  Patient states that he is focused on getting help for his substance use.  He states that he uses crack cocaine daily for the last 2 years.  He also reports 2 years of alcohol use, 7-10 16 oz containers of liquor daily.  Patient states that he has been in detox/rehab centers before, the last one a 30-day detox center in [place].  Patient is speaking in clear, coherent sentences.  It appears that he has metabolized substances.  Patient does complain of aches, sore throat and chills.  EDP is aware and medications are ordered appropriately.  Patient denies medications for any psychiatric illnesses or formal mental health diagnosis. States any depression is related to substance abuse.  He denies being diagnosed with psychiatric illness.  Chart review reveals he has been prescribed Suboxone as recently as January 11, 2022 for 7 day supply.   Writer advised patient to contact the local outpatient provider that he has previously seen for his substance use, as well as [facility] in [place] to see if they have a bed.   Past Psychiatric History: denies  Risk to Self:   Risk to Others:    Prior Inpatient Therapy:   Prior Outpatient Therapy:    Past Medical History:  Past Medical History:  Diagnosis Date   Cocaine abuse (HCC)    ETOH abuse    GERD (gastroesophageal reflux disease)    History of methicillin resistant staphylococcus aureus (MRSA)    History of participation in smoking cessation counseling    Hypertension    H/O IN 2016-PCP TOOK PT OFF BP MED DUE TO BP CONTROLLED    Past Surgical History:  Procedure Laterality Date   APPENDECTOMY     BAND HEMORRHOIDECTOMY     CARPAL TUNNEL RELEASE Right    clavicale surgery Left    VASECTOMY N/A 02/28/2017   Procedure: VASECTOMY;  Surgeon: Hildred LaserBudzyn, Brian James, MD;  Location: [facility] ORS;  Service: Urology;  Laterality: N/A;   VASECTOMY Bilateral 03/28/2017   Procedure: VASECTOMY;  Surgeon: Hildred LaserBudzyn, Brian James, MD;  Location: [facility] ORS;  Service: Urology;  Laterality: Bilateral;   Family History:  Family History  Problem Relation Age of Onset   Prostate cancer Neg Hx    Kidney cancer Neg Hx    Family Psychiatric  History:  Social History:  Social History   Substance and Sexual Activity  Alcohol Use Not Currently   Comment: OCC     Social History   Substance and Sexual Activity  Drug Use Not Currently    Social History   Socioeconomic History   Marital status: Married    Spouse name: Not on file   Number of children: Not on file   Years of  education: Not on file   Highest education level: Not on file  Occupational History   Not on file  Tobacco Use   Smoking status: Every Day    Packs/day: 0.50    Years: 13.00    Total pack years: 6.50    Types: Cigarettes   Smokeless tobacco: Never  Vaping Use   Vaping Use: Never used  Substance and Sexual Activity   Alcohol use: Not Currently    Comment: OCC   Drug use: Not Currently   Sexual activity: Not on file  Other Topics Concern   Not on file  Social History Narrative   Not on file   Social Determinants of Health   Financial Resource Strain:  Not on file  Food Insecurity: Not on file  Transportation Needs: Not on file  Physical Activity: Not on file  Stress: Not on file  Social Connections: Not on file   Additional Social History:    Allergies:  No Known Allergies  Labs:  Results for orders placed or performed during the hospital encounter of 02/19/22 (from the past 48 hour(s))  Comprehensive metabolic panel     Status: Abnormal   Collection Time: 02/19/22  5:43 PM  Result Value Ref Range   Sodium 139 135 - 145 mmol/L   Potassium 3.0 (L) 3.5 - 5.1 mmol/L   Chloride 105 98 - 111 mmol/L   CO2 23 22 - 32 mmol/L   Glucose, Bld 118 (H) 70 - 99 mg/dL    Comment: Glucose reference range applies only to samples taken after fasting for at least 8 hours.   BUN 12 6 - 20 mg/dL   Creatinine, Ser 8.12 0.61 - 1.24 mg/dL   Calcium 8.5 (L) 8.9 - 10.3 mg/dL   Total Protein 7.3 6.5 - 8.1 g/dL   Albumin 3.9 3.5 - 5.0 g/dL   AST 37 15 - 41 U/L   ALT 31 0 - 44 U/L   Alkaline Phosphatase 106 38 - 126 U/L   Total Bilirubin 0.7 0.3 - 1.2 mg/dL   GFR, Estimated >75 >17 mL/min    Comment: (NOTE) Calculated using the CKD-EPI Creatinine Equation (2021)    Anion gap 11 5 - 15    Comment: Performed at [facility], [address], [place], [place] [address]  Ethanol     Status: Abnormal   Collection Time: 02/19/22  5:43 PM  Result Value Ref Range   Alcohol, Ethyl (B) 127 (H) <10 mg/dL    Comment: (NOTE) Lowest detectable limit for serum alcohol is 10 mg/dL.  For medical purposes only. Performed at [facility], [address] [place], [place] [address]   Salicylate level     Status: Abnormal   Collection Time: 02/19/22  5:43 PM  Result Value Ref Range   Salicylate Lvl <7.0 (L) 7.0 - 30.0 mg/dL    Comment: Performed at [facility], [address] [place], [place] [address]  Acetaminophen level     Status: Abnormal   Collection Time: 02/19/22  5:43 PM  Result Value Ref Range   Acetaminophen  (Tylenol), Serum <10 (L) 10 - 30 ug/mL    Comment: (NOTE) Therapeutic concentrations vary significantly. A range of 10-30 ug/mL  may be an effective concentration for many patients. However, some  are best treated at concentrations outside of this range. Acetaminophen concentrations >150 ug/mL at 4 hours after ingestion  and >50 ug/mL at 12 hours after ingestion are often associated with  toxic reactions.  Performed at [facility], [address] [place], [place] [address]   cbc     Status: Abnormal   Collection Time: 02/19/22  5:43 PM  Result Value Ref Range   WBC 11.2 (H) 4.0 - 10.5 K/uL   RBC 4.98 4.22 - 5.81 MIL/uL   Hemoglobin 16.0 13.0 - 17.0 g/dL   HCT 48.2 50.0 - 37.0 %   MCV 96.8 80.0 - 100.0 fL   MCH 32.1 26.0 - 34.0 pg   MCHC 33.2 30.0 - 36.0 g/dL   RDW 48.8 89.1 - 69.4 %   Platelets 311 150 - 400 K/uL   nRBC 0.0 0.0 - 0.2 %    Comment: Performed at [facility], [address] [place], [place] [address]  Urine Drug Screen, Qualitative     Status: Abnormal   Collection Time: 02/19/22  5:43 PM  Result Value Ref Range   Tricyclic, Ur Screen NONE DETECTED NONE DETECTED   Amphetamines, Ur Screen NONE DETECTED NONE DETECTED   MDMA (Ecstasy)Ur Screen NONE DETECTED NONE DETECTED   Cocaine Metabolite,Ur [place] POSITIVE (A) NONE DETECTED   Opiate, Ur Screen NONE DETECTED NONE DETECTED   Phencyclidine (PCP) Ur S NONE DETECTED NONE DETECTED   Cannabinoid 50 Ng, Ur [place] POSITIVE (A) NONE DETECTED   Barbiturates, Ur Screen NONE DETECTED NONE DETECTED   Benzodiazepine, Ur Scrn NONE DETECTED NONE DETECTED   Methadone Scn, Ur NONE DETECTED NONE DETECTED    Comment: (NOTE) Tricyclics + metabolites, urine    Cutoff 1000 ng/mL Amphetamines + metabolites, urine  Cutoff 1000 ng/mL MDMA (Ecstasy), urine              Cutoff 500 ng/mL Cocaine Metabolite, urine          Cutoff 300 ng/mL Opiate + metabolites, urine        Cutoff 300 ng/mL Phencyclidine (PCP), urine          Cutoff 25 ng/mL Cannabinoid, urine                 Cutoff 50 ng/mL Barbiturates + metabolites, urine  Cutoff 200 ng/mL Benzodiazepine, urine              Cutoff 200 ng/mL Methadone, urine                   Cutoff 300 ng/mL  The urine drug screen provides only a preliminary, unconfirmed analytical test result and should not be used for non-medical purposes. Clinical consideration and professional judgment should be applied to any positive drug screen result due to possible interfering substances. A more specific alternate chemical method must be used in order to obtain a confirmed analytical result. Gas chromatography / mass spectrometry (GC/MS) is the preferred confirm atory method. Performed at [facility], [address] [place], [place] [address]   SARS Coronavirus 2 by RT PCR (hospital order, performed in [facility] hospital lab) *cepheid single result test* Anterior Nasal Swab     Status: None   Collection Time: 02/19/22  7:29 PM   Specimen: Anterior Nasal Swab  Result Value Ref Range   SARS Coronavirus 2 by RT PCR NEGATIVE NEGATIVE    Comment: (NOTE) SARS-CoV-2 target nucleic acids are NOT DETECTED.  The SARS-CoV-2 RNA is generally detectable in upper and lower respiratory specimens during the acute phase of infection. The lowest concentration of SARS-CoV-2 viral copies this assay can detect is 250 copies / mL. A negative result does not preclude SARS-CoV-2 infection and should  not be used as the sole basis for treatment or other patient management decisions.  A negative result may occur with improper specimen collection / handling, submission of specimen other than nasopharyngeal swab, presence of viral mutation(s) within the areas targeted by this assay, and inadequate number of viral copies (<250 copies / mL). A negative result must be combined with clinical observations, patient history, and epidemiological information.  Fact Sheet for Patients:    RoadLapTop.co.za  Fact Sheet for Healthcare Providers: http://kim-miller.com/  This test is not yet approved or  cleared by the [place] FDA and has been authorized for detection and/or diagnosis of SARS-CoV-2 by FDA under an Emergency Use Authorization (EUA).  This EUA will remain in effect (meaning this test can be used) for the duration of the COVID-19 declaration under Section 564(b)(1) of the Act, 21 U.S.C. section 360bbb-3(b)(1), unless the authorization is terminated or revoked sooner.  Performed at [facility], [address] [place], [place] [address]   Urinalysis, Routine w reflex microscopic     Status: Abnormal   Collection Time: 02/19/22  8:13 PM  Result Value Ref Range   Color, Urine YELLOW (A) YELLOW   APPearance CLEAR (A) CLEAR   Specific Gravity, Urine 1.018 1.005 - 1.030   pH 6.0 5.0 - 8.0   Glucose, UA NEGATIVE NEGATIVE mg/dL   Hgb urine dipstick NEGATIVE NEGATIVE   Bilirubin Urine NEGATIVE NEGATIVE   Ketones, ur NEGATIVE NEGATIVE mg/dL   Protein, ur 30 (A) NEGATIVE mg/dL   Nitrite NEGATIVE NEGATIVE   Leukocytes,Ua NEGATIVE NEGATIVE   RBC / HPF 0-5 0 - 5 RBC/hpf   WBC, UA 0-5 0 - 5 WBC/hpf   Bacteria, UA NONE SEEN NONE SEEN   Squamous Epithelial / LPF 0-5 0 - 5   Mucus PRESENT     Comment: Performed at [facility], [address] [place], [place] [address]  Group A Strep by PCR     Status: Abnormal   Collection Time: 02/20/22 10:43 AM   Specimen: Throat; Sterile Swab  Result Value Ref Range   Group A Strep by PCR DETECTED (A) NOT DETECTED    Comment: Performed at [facility], [address], [place], [place] [address]    Current Facility-Administered Medications  Medication Dose Route Frequency Provider Last Rate Last Admin   LORazepam (ATIVAN) injection 0-4 mg  0-4 mg Intravenous Q6H Chesley Noon, MD       Or   LORazepam (ATIVAN) tablet 0-4 mg  0-4 mg Oral Q6H  Chesley Noon, MD   2 mg at 02/20/22 0145   [START ON 02/22/2022] LORazepam (ATIVAN) injection 0-4 mg  0-4 mg Intravenous Q12H Chesley Noon, MD       Or   Melene Muller ON 02/22/2022] LORazepam (ATIVAN) tablet 0-4 mg  0-4 mg Oral Q12H Chesley Noon, MD       thiamine (VITAMIN B1) tablet 100 mg  100 mg Oral Daily Chesley Noon, MD   100 mg at 02/20/22 5400   Or   thiamine (VITAMIN B1) injection 100 mg  100 mg Intravenous Daily Chesley Noon, MD       Current Outpatient Medications  Medication Sig Dispense Refill   chlordiazePOXIDE (LIBRIUM) 25 MG capsule Take 2 capsules (50 mg total) by mouth See admin instructions. 50 mg 3 times a day x 3 days 50 mg 2 times a day for 3 days 50 mg once a day for 3 days stop Total of 36 pills 36 capsule 0  SUBOXONE 8-2 MG FILM Place under the tongue 2 (two) times daily.       Musculoskeletal: Strength & Muscle Tone: within normal limits Gait & Station: normal Patient leans: N/A            Psychiatric Specialty Exam:  Presentation  General Appearance: Casual Eye Contact:Good  Speech:Clear and Coherent  Speech Volume:Normal  Handedness:No data recorded  Mood and Affect  Mood:Euthymic  Affect:Congruent   Thought Process  Thought Processes:Coherent  Descriptions of Associations:Intact  Orientation:Full (Time, Place and Person)  Thought Content:WDL  History of Schizophrenia/Schizoaffective disorder:No data recorded Duration of Psychotic Symptoms:No data recorded Hallucinations:Hallucinations: None  Ideas of Reference:No data recorded Suicidal Thoughts:Suicidal Thoughts: No  Homicidal Thoughts:Homicidal Thoughts: No   Sensorium  Memory:No data recorded Judgment:Fair  Insight:Fair   Executive Functions  Concentration:Fair  Attention Span:Fair  Recall:No data recorded Fund of Knowledge:Fair  Language:Fair   Psychomotor Activity  Psychomotor Activity:Psychomotor Activity: Normal   Assets   Assets:Housing; Intimacy; Financial Resources/Insurance; Desire for Improvement; Physical Health; Resilience; Social Support   Sleep  Sleep:Sleep: Fair   Physical Exam: Physical Exam Vitals and nursing note reviewed.  HENT:     Nose: No congestion or rhinorrhea.  Eyes:     General:        Right eye: No discharge.        Left eye: No discharge.  Cardiovascular:     Rate and Rhythm: Normal rate.  Musculoskeletal:        General: Normal range of motion.     Cervical back: Normal range of motion.  Neurological:     Mental Status: He is alert and oriented to person, place, and time.  Psychiatric:        Attention and Perception: Attention normal.        Mood and Affect: Affect is blunt.        Behavior: Behavior is cooperative.        Thought Content: Thought content is not paranoid or delusional. Thought content does not include homicidal or suicidal ideation.        Cognition and Memory: Cognition normal.        Judgment: Judgment normal.    Review of Systems  Constitutional:  Positive for malaise/fatigue.  HENT:  Positive for sore throat.   Eyes: Negative.   Respiratory: Negative.    Cardiovascular: Negative.   Musculoskeletal:  Positive for myalgias.  Skin: Negative.   Psychiatric/Behavioral:  Positive for substance abuse. Negative for suicidal ideas.    Blood pressure (!) 164/120, pulse 74, temperature 97.6 F (36.4 C), temperature source Oral, resp. rate 16, height 6\' 1"  (1.854 m), weight 72.6 kg, SpO2 95 %. Body mass index is 21.11 kg/m.  Treatment Plan Summary: Plan Patient denies suicidal ideation. Patient should contact latest  outpatient prescriber that he saw for substance use treatment. Can call [facility] for inpatient substance treatment to inquire about a bed. Reviewed with EDP  Disposition: No evidence of imminent risk to self or others at present.   Patient does not meet criteria for psychiatric inpatient admission. Supportive therapy provided about ongoing  stressors. Discussed crisis plan, support from social network, calling 911, coming to the Emergency Department, and calling Suicide Hotline.  , NP 02/20/2022 1:52 PM

## 2022-02-20 NOTE — BH Assessment (Signed)
Comprehensive Clinical Assessment (CCA) Screening, Triage and Referral Note  02/20/2022 Guy Rodriguez [id number]  Guy Rodriguez, 41 year old male who presents to [facility] ED involuntarily for treatment. Per triage note, Pt via BPD from [facility] under IVC. Pt states he is suicidal, states he also wanted to detox off of crack and alcohol, last used was around 2:00pm today. Denies HI. Denies AVH. Pt is calm and cooperative but anxious and tearful on arrival. Per IVC paperwork, pt is SI and HI. States that the client has not eaten in past 3 days. States that "it will not be good" if he goes back home.   During [facility] assessment pt presents alert and oriented x 4, restless but cooperative, and mood-congruent with affect. The pt does not appear to be responding to internal or external stimuli. Neither is the pt presenting with any delusional thinking. Pt verified the information provided to triage RN.   Pt identifies his main complaint to be that he wants to stop using crack/cocaine and drinking alcohol. Patient reports he is going through withdrawals, and it is the worst he has ever had. Patient states he is nauseous, has tremors, and feels terrible. During assessment, patient is shaking, and sweaty. Patient states he has been drinking daily for the past 2 years. "I drink 7-10 16 oz beers and 4 Locos (24 oz.)  every day." Patient also reports he has been using crack cocaine daily for the past year. Patient reports he is not currently working at the moment but has employment secured once he returns from treatment. Patient reports he lives with his wife in [place], [place]. Pt reports past INPT hx 3 years ago at [facility]. Pt denies current SI/HI/AH/VH and depression stems from drug use. Patient is being followed by [facility].    Per Sallye Ober, NP, pt shows n No evidence of imminent risk to self or others at present and does not meet criteria for psychiatric inpatient admission. Patient was advised to follow up with outpatient  resources and provided services for detox/rehab ([facility]).    Chief Complaint:  Chief Complaint  Patient presents with   Suicidal   Visit Diagnosis: Alcohol abuse  Patient Reported Information How did you hear about [place]? -- ([facility])  What Is the Reason for Your Visit/Call Today? Patient wants detox/rehab.  How Long Has This Been Causing You Problems? > than 6 months  What Do You Feel Would Help You the Most Today? Alcohol or Drug Use Treatment   Have You Recently Had Any Thoughts About Hurting Yourself? No  Are You Planning to Commit Suicide/Harm Yourself At This time? No   Have you Recently Had Thoughts About Hurting Someone Guy Rodriguez? No  Are You Planning to Harm Someone at This Time? No  Explanation: No data recorded  Have You Used Any Alcohol or Drugs in the Past 24 Hours? Yes  How Long Ago Did You Use Drugs or Alcohol? No data recorded What Did You Use and How Much? Crack cocaine and alcohol   Do You Currently Have a Therapist/Psychiatrist? No  Name of Therapist/Psychiatrist: No data recorded  Have You Been Recently Discharged From Any Office Practice or Programs? No  Explanation of Discharge From Practice/Program: No data recorded   CCA Screening Triage Referral Assessment Type of Contact: Face-to-Face  Telemedicine Service Delivery:   Is this Initial or Reassessment? No data recorded Date Telepsych consult ordered in [facility]:  No data recorded Time Telepsych consult ordered in [facility]:  No data recorded Location of Assessment: [facility] ED  Provider Location: [facility] ED   Collateral Involvement: None provided   Does Patient Have a Court Appointed Legal Guardian? No data recorded Name and Contact of Legal Guardian: No data recorded If Minor and Not Living with Parent(s), Who has Custody? n/a  Is CPS involved or ever been involved? Never  Is APS involved or ever been involved? Never   Patient Determined To Be At Risk for Harm To Self or Others Based on Review of Patient  Reported Information or Presenting Complaint? No  Method: No data recorded Availability of Means: No data recorded Intent: No data recorded Notification Required: No data recorded Additional Information for Danger to Others Potential: No data recorded Additional Comments for Danger to Others Potential: No data recorded Are There Guns or Other Weapons in Your Home? No data recorded Types of Guns/Weapons: No data recorded Are These Weapons Safely Secured?                            No data recorded Who Could Verify You Are Able To Have These Secured: No data recorded Do You Have any Outstanding Charges, Pending Court Dates, Parole/Probation? No data recorded Contacted To Inform of Risk of Harm To Self or Others: No data recorded  Does Patient Present under Involuntary Commitment? Yes  IVC Papers Initial File Date: 02/20/22   [place] of Residence: [place]   Patient Currently Receiving the Following Services: Not Receiving Services   Determination of Need: Emergent (2 hours)   Options For Referral: ED Visit; Intensive Outpatient Therapy; Medication Management; Outpatient Therapy; Chemical Dependency Intensive Outpatient Therapy (CDIOP)   Discharge Disposition:     Clerance Lav, Counselor, LCAS-A

## 2022-02-22 ENCOUNTER — Other Ambulatory Visit: Payer: Self-pay

## 2022-02-22 ENCOUNTER — Emergency Department
Admission: EM | Admit: 2022-02-22 | Discharge: 2022-02-22 | Disposition: A | Discharge status: Home / Self Care | Payer: Medicaid Other | Attending: Emergency Medicine | Admitting: Emergency Medicine

## 2022-02-22 DIAGNOSIS — F32A Depression, unspecified: Secondary | ICD-10-CM

## 2022-02-22 DIAGNOSIS — F1910 Other psychoactive substance abuse, uncomplicated: Secondary | ICD-10-CM | POA: Diagnosis not present

## 2022-02-22 DIAGNOSIS — F17210 Nicotine dependence, cigarettes, uncomplicated: Secondary | ICD-10-CM | POA: Diagnosis not present

## 2022-02-22 DIAGNOSIS — F1410 Cocaine abuse, uncomplicated: Secondary | ICD-10-CM | POA: Diagnosis present

## 2022-02-22 DIAGNOSIS — F1010 Alcohol abuse, uncomplicated: Secondary | ICD-10-CM | POA: Insufficient documentation

## 2022-02-22 DIAGNOSIS — F329 Major depressive disorder, single episode, unspecified: Secondary | ICD-10-CM | POA: Insufficient documentation

## 2022-02-22 DIAGNOSIS — F14188 Cocaine abuse with other cocaine-induced disorder: Secondary | ICD-10-CM | POA: Insufficient documentation

## 2022-02-22 DIAGNOSIS — Y906 Blood alcohol level of 120-199 mg/100 ml: Secondary | ICD-10-CM | POA: Insufficient documentation

## 2022-02-22 DIAGNOSIS — I10 Essential (primary) hypertension: Secondary | ICD-10-CM | POA: Diagnosis not present

## 2022-02-22 DIAGNOSIS — F1994 Other psychoactive substance use, unspecified with psychoactive substance-induced mood disorder: Secondary | ICD-10-CM | POA: Diagnosis not present

## 2022-02-22 DIAGNOSIS — F1210 Cannabis abuse, uncomplicated: Secondary | ICD-10-CM | POA: Insufficient documentation

## 2022-02-22 DIAGNOSIS — F321 Major depressive disorder, single episode, moderate: Secondary | ICD-10-CM | POA: Diagnosis present

## 2022-02-22 LAB — COMPREHENSIVE METABOLIC PANEL
ALT: 31 U/L (ref 0–44)
AST: 37 U/L (ref 15–41)
Albumin: 3.9 g/dL (ref 3.5–5.0)
Alkaline Phosphatase: 104 U/L (ref 38–126)
Anion gap: 10 (ref 5–15)
BUN: 11 mg/dL (ref 6–20)
CO2: 20 mmol/L — ABNORMAL LOW (ref 22–32)
Calcium: 8.6 mg/dL — ABNORMAL LOW (ref 8.9–10.3)
Chloride: 109 mmol/L (ref 98–111)
Creatinine, Ser: 0.78 mg/dL (ref 0.61–1.24)
GFR, Estimated: >60 mL/min (ref >60)
Glucose, Bld: 103 mg/dL — ABNORMAL HIGH (ref 70–99)
Potassium: 3.4 mmol/L — ABNORMAL LOW (ref 3.5–5.1)
Sodium: 139 mmol/L (ref 135–145)
Total Bilirubin: 0.6 mg/dL (ref 0.3–1.2)
Total Protein: 7.5 g/dL (ref 6.5–8.1)

## 2022-02-22 LAB — CBC
HCT: 46.6 % (ref 39.0–52.0)
Hemoglobin: 15.7 g/dL (ref 13.0–17.0)
MCH: 32 pg (ref 26.0–34.0)
MCHC: 33.7 g/dL (ref 30.0–36.0)
MCV: 95.1 fL (ref 80.0–100.0)
Platelets: 262 10*3/uL (ref 150–400)
RBC: 4.9 MIL/uL (ref 4.22–5.81)
RDW: 14.3 % (ref 11.5–15.5)
WBC: 8.5 10*3/uL (ref 4.0–10.5)
nRBC: 0 % (ref 0.0–0.2)

## 2022-02-22 LAB — URINE DRUG SCREEN, QUALITATIVE (ARMC ONLY)
Amphetamines, Ur Screen: NOT DETECTED
Barbiturates, Ur Screen: POSITIVE — AB
Benzodiazepine, Ur Scrn: POSITIVE — AB
Cannabinoid 50 Ng, Ur ~~LOC~~: POSITIVE — AB
Cocaine Metabolite,Ur ~~LOC~~: POSITIVE — AB
MDMA (Ecstasy)Ur Screen: NOT DETECTED
Methadone Scn, Ur: NOT DETECTED
Opiate, Ur Screen: NOT DETECTED
Phencyclidine (PCP) Ur S: NOT DETECTED
Tricyclic, Ur Screen: NOT DETECTED

## 2022-02-22 LAB — ACETAMINOPHEN LEVEL: Acetaminophen (Tylenol), Serum: <10 ug/mL — ABNORMAL LOW (ref 10–30)

## 2022-02-22 LAB — SALICYLATE LEVEL: Salicylate Lvl: <7 mg/dL — ABNORMAL LOW (ref 7.0–30.0)

## 2022-02-22 LAB — ETHANOL: Alcohol, Ethyl (B): 157 mg/dL — ABNORMAL HIGH (ref <10)

## 2022-02-22 MED ORDER — THIAMINE HCL 100 MG PO TABS: 100.0000 mg | ORAL_TABLET | Freq: Every day | ORAL | Status: DC

## 2022-02-22 MED ORDER — LORAZEPAM 2 MG PO TABS: 0.0000 mg | ORAL_TABLET | Freq: Two times a day (BID) | ORAL | Status: DC

## 2022-02-22 MED ORDER — LORAZEPAM 2 MG/ML IJ SOLN: 0.0000 mg | Freq: Two times a day (BID) | INTRAMUSCULAR | Status: DC

## 2022-02-22 MED ORDER — LORAZEPAM 2 MG/ML IJ SOLN: 0.0000 mg | Freq: Four times a day (QID) | INTRAMUSCULAR | Status: DC

## 2022-02-22 MED ORDER — THIAMINE HCL 100 MG/ML IJ SOLN: 100.0000 mg | Freq: Every day | INTRAMUSCULAR | Status: DC

## 2022-02-22 MED ORDER — LORAZEPAM 2 MG PO TABS: 0.0000 mg | ORAL_TABLET | Freq: Four times a day (QID) | ORAL | Status: DC

## 2022-02-22 MED ORDER — POTASSIUM CHLORIDE CRYS ER 20 MEQ PO TBCR
40.0000 meq | EXTENDED_RELEASE_TABLET | Freq: Once | ORAL | Status: AC
  Administered 2022-02-22: 40 meq via ORAL
  Filled 2022-02-22: qty 2
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
## 2022-02-22 NOTE — ED Notes (Signed)
Verified correct patient and correct discharge papers given. Pt alert and oriented X 4, stable for discharge. RR even and unlabored, color WNL. Discussed discharge instructions and follow-up as directed. Discharge medications discussed, when prescribed. Pt had opportunity to ask questions, and RN available to provide patient and/or family education. Left with all of belongings. Given breakfast.

## 2022-02-22 NOTE — ED Notes (Signed)
Pt dressed out in triage room 1 with [place], EDT.    Pt belongings: Black t-shirt Black shorts  White socks White tennis shoes  [employer]  Black cell phone  Black wallet  Yellow cigarette lighter  Multi colored lighter  E-cigarette  2 black vehicle fuses

## 2022-02-22 NOTE — ED Provider Notes (Signed)
[facility] Provider Note    Event Date/Time   First MD Initiated Contact with Patient 02/22/22 0122     (approximate)   History   Psychiatric Evaluation   HPI  Guy Rodriguez is a 41 y.o. male with history of substance abuse, hypertension who presents to the emergency department with complaints of feeling suicidal.  He denies any known plan.  States he drank alcohol today and used crack cocaine 3 days ago.  No HI or hallucinations.   History provided by patient.    Past Medical History:  Diagnosis Date   Cocaine abuse (HCC)    ETOH abuse    GERD (gastroesophageal reflux disease)    History of methicillin resistant staphylococcus aureus (MRSA)    History of participation in smoking cessation counseling    Hypertension    H/O IN 2016-PCP TOOK PT OFF BP MED DUE TO BP CONTROLLED    Past Surgical History:  Procedure Laterality Date   APPENDECTOMY     BAND HEMORRHOIDECTOMY     CARPAL TUNNEL RELEASE Right    clavicale surgery Left    VASECTOMY N/A 02/28/2017   Procedure: VASECTOMY;  Surgeon: Hildred Laser, MD;  Location: [facility] ORS;  Service: Urology;  Laterality: N/A;   VASECTOMY Bilateral 03/28/2017   Procedure: VASECTOMY;  Surgeon: Hildred Laser, MD;  Location: [facility] ORS;  Service: Urology;  Laterality: Bilateral;    MEDICATIONS:  Prior to Admission medications   Medication Sig Start Date End Date Taking? Authorizing Provider  chlordiazePOXIDE (LIBRIUM) 25 MG capsule Take 2 capsules (50 mg total) by mouth See admin instructions. 50 mg 3 times a day x 3 days 50 mg 2 times a day for 3 days 50 mg once a day for 3 days stop Total of 36 pills 02/20/22   Willy Eddy, MD  SUBOXONE 8-2 MG FILM Place under the tongue 2 (two) times daily.  10/28/19   [provider]  pregabalin (LYRICA) 75 MG capsule Take 1 capsule (75 mg total) by mouth daily. Patient not taking: Reported on 02/14/2020 01/12/19 05/22/20  Georgiana Spinner, NP   ranitidine (ZANTAC) 150 MG tablet Take 150 mg by mouth 2 (two) times daily.  Patient not taking: Reported on 02/14/2020  05/22/20  [provider]    Physical Exam   Triage Vital Signs: ED Triage Vitals  Enc Vitals Group     BP 02/22/22 0107 (!) 150/119     Pulse Rate 02/22/22 0107 88     Resp 02/22/22 0107 20     Temp 02/22/22 0107 98.4 F (36.9 C)     Temp Source 02/22/22 0107 Oral     SpO2 02/22/22 0107 99 %     Weight 02/22/22 0108 170 lb (77.1 kg)     Height 02/22/22 0108 6\' 1"  (1.854 m)     Head Circumference --      Peak Flow --      Pain Score 02/22/22 0116 0     Pain Loc --      Pain Edu? --      Excl. in GC? --     Most recent vital signs: Vitals:   02/22/22 0107  BP: (!) 150/119  Pulse: 88  Resp: 20  Temp: 98.4 F (36.9 C)  SpO2: 99%    CONSTITUTIONAL: Alert and oriented and responds appropriately to questions.  Chronically ill-appearing and appears older than stated age HEAD: Normocephalic, atraumatic EYES: Conjunctivae clear, pupils appear equal, sclera nonicteric ENT:  normal nose; moist mucous membranes NECK: Supple, normal ROM CARD: RRR; S1 and S2 appreciated; no murmurs, no clicks, no rubs, no gallops RESP: Normal chest excursion without splinting or tachypnea; breath sounds clear and equal bilaterally; no wheezes, no rhonchi, no rales, no hypoxia or respiratory distress, speaking full sentences ABD/GI: Normal bowel sounds; non-distended; soft, non-tender, no rebound, no guarding, no peritoneal signs BACK: The back appears normal EXT: Normal ROM in all joints; no deformity noted, no edema; no cyanosis SKIN: Normal color for age and race; warm; no rash on exposed skin NEURO: Moves all extremities equally, normal speech PSYCH: Careful.  States he is suicidal without plan.  No HI or hallucinations.   ED Results / Procedures / Treatments   LABS: (all labs ordered are listed, but only abnormal results are displayed) Labs Reviewed   COMPREHENSIVE METABOLIC PANEL - Abnormal; Notable for the following components:      Result Value   Potassium 3.4 (*)    CO2 20 (*)    Glucose, Bld 103 (*)    Calcium 8.6 (*)    All other components within normal limits  ETHANOL - Abnormal; Notable for the following components:   Alcohol, Ethyl (B) 157 (*)    All other components within normal limits  SALICYLATE LEVEL - Abnormal; Notable for the following components:   Salicylate Lvl <7.0 (*)    All other components within normal limits  ACETAMINOPHEN LEVEL - Abnormal; Notable for the following components:   Acetaminophen (Tylenol), Serum <10 (*)    All other components within normal limits  URINE DRUG SCREEN, QUALITATIVE ([facility] ONLY) - Abnormal; Notable for the following components:   Cocaine Metabolite,Ur [place] POSITIVE (*)    Cannabinoid 50 Ng, Ur [place] POSITIVE (*)    Barbiturates, Ur Screen POSITIVE (*)    Benzodiazepine, Ur Scrn POSITIVE (*)    All other components within normal limits  CBC     EKG:   RADIOLOGY: My personal review and interpretation of imaging:    I have personally reviewed all radiology reports.   No results found.   PROCEDURES:  Critical Care performed: No     Procedures    IMPRESSION / MDM / ASSESSMENT AND PLAN / ED COURSE  I reviewed the triage vital signs and the nursing notes.    Patient here stating he is suicidal without plan.  Was just seen in the emergency department for the same several days ago and was discharged home after he sobered up and denied suicidality.    DIFFERENTIAL DIAGNOSIS (includes but not limited to):   Depression, anxiety, intoxication, substance abuse   Patient's presentation is most consistent with acute presentation with potential threat to life or bodily function.   PLAN: We will obtain screening labs with CBC, CMP, ethanol level, Tylenol and salicylate levels, urine drug screen.  We will consult TTS and psychiatry.  No acute medical complaints.  Placed  on CIWA protocol.   MEDICATIONS GIVEN IN ED: Medications  LORazepam (ATIVAN) injection 0-4 mg (0 mg Intravenous Hold 02/22/22 0201)    Or  LORazepam (ATIVAN) tablet 0-4 mg ( Oral See Alternative 02/22/22 0201)  LORazepam (ATIVAN) injection 0-4 mg (has no administration in time range)    Or  LORazepam (ATIVAN) tablet 0-4 mg (has no administration in time range)  thiamine (VITAMIN B1) tablet 100 mg (has no administration in time range)    Or  thiamine (VITAMIN B1) injection 100 mg (has no administration in time range)  potassium chloride SA (  KLOR-CON M) CR tablet 40 mEq (has no administration in time range)     ED COURSE: Labs unremarkable other than alcohol level of 157.  Drug screen positive for cocaine, cannabinoids, barbiturates and benzodiazepines.  Tylenol and salicylate levels are negative.  Patient is medically cleared.  He has been seen by psychiatry and TTS and they feel he is safe for discharge in the morning.  Will provide with outpatient resources.   At this time, I do not feel there is any life-threatening condition present. I reviewed all nursing notes, vitals, pertinent previous records.  All lab and urine results, EKGs, imaging ordered have been independently reviewed and interpreted by myself.  I reviewed all available radiology reports from any imaging ordered this visit.  Based on my assessment, I feel the patient is safe to be discharged home without further emergent workup and can continue workup as an outpatient as needed. Discussed all findings, treatment plan as well as usual and customary return precautions.  They verbalize understanding and are comfortable with this plan.  Outpatient follow-up has been provided as needed.  All questions have been answered.   CONSULTS: TTS and psychiatry have been consulted.   OUTSIDE RECORDS REVIEWED: Reviewed patient's last family medicine note with Rolin Barry on 06/20/2021.       FINAL CLINICAL IMPRESSION(S) / ED DIAGNOSES    Final diagnoses:  Substance abuse (HCC)  Depression, unspecified depression type     Rx / DC Orders   ED Discharge Orders     None        Note:  This document was prepared using Dragon voice recognition software and may include unintentional dictation errors.   Cashe Gatt, Layla Maw, DO 02/22/22 [phone]
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
## 2022-02-22 NOTE — ED Triage Notes (Signed)
Pt presents to ER with c/o need of a psych eval.  Pt states he has been very stressed out recently, and has been struggling with drug addiction.  Pt endorses SI but denies HI/AVH.  Pt states he has been seen at several facilities recently for same and has been discharged.  Pt states he last used alcohol yesterday and used crack last 3 days ago.  Pt is A&O x4 at this time and cooperative with staff in triage.

## 2022-02-22 NOTE — ED Notes (Signed)
VOL/Consult Ordered/ Completed/Plan to D/C

## 2022-02-25 ENCOUNTER — Ambulatory Visit (INDEPENDENT_AMBULATORY_CARE_PROVIDER_SITE_OTHER): Payer: Medicaid Other | Admitting: Nurse Practitioner

## 2022-04-07 ENCOUNTER — Inpatient Hospital Stay (HOSPITAL_COMMUNITY)
Admission: EM | Admit: 2022-04-07 | Discharge: 2022-04-15 | DRG: 460 | Disposition: A | Discharge status: Home / Self Care | Payer: Medicaid Other | Attending: Neurological Surgery | Admitting: Neurological Surgery

## 2022-04-07 ENCOUNTER — Other Ambulatory Visit: Payer: Self-pay

## 2022-04-07 ENCOUNTER — Emergency Department (HOSPITAL_COMMUNITY): Payer: Medicaid Other

## 2022-04-07 DIAGNOSIS — S32018A Other fracture of first lumbar vertebra, initial encounter for closed fracture: Principal | ICD-10-CM | POA: Diagnosis present

## 2022-04-07 DIAGNOSIS — Z8614 Personal history of Methicillin resistant Staphylococcus aureus infection: Secondary | ICD-10-CM

## 2022-04-07 DIAGNOSIS — F17210 Nicotine dependence, cigarettes, uncomplicated: Secondary | ICD-10-CM | POA: Diagnosis present

## 2022-04-07 DIAGNOSIS — K5900 Constipation, unspecified: Secondary | ICD-10-CM | POA: Diagnosis not present

## 2022-04-07 DIAGNOSIS — S2242XA Multiple fractures of ribs, left side, initial encounter for closed fracture: Secondary | ICD-10-CM

## 2022-04-07 DIAGNOSIS — S32009A Unspecified fracture of unspecified lumbar vertebra, initial encounter for closed fracture: Secondary | ICD-10-CM | POA: Diagnosis present

## 2022-04-07 DIAGNOSIS — Y92410 Unspecified street and highway as the place of occurrence of the external cause: Secondary | ICD-10-CM

## 2022-04-07 DIAGNOSIS — S32010A Wedge compression fracture of first lumbar vertebra, initial encounter for closed fracture: Secondary | ICD-10-CM

## 2022-04-07 DIAGNOSIS — F32A Depression, unspecified: Secondary | ICD-10-CM | POA: Diagnosis present

## 2022-04-07 DIAGNOSIS — Z79899 Other long term (current) drug therapy: Secondary | ICD-10-CM

## 2022-04-07 DIAGNOSIS — F10920 Alcohol use, unspecified with intoxication, uncomplicated: Secondary | ICD-10-CM

## 2022-04-07 DIAGNOSIS — F1910 Other psychoactive substance abuse, uncomplicated: Secondary | ICD-10-CM

## 2022-04-07 DIAGNOSIS — F10220 Alcohol dependence with intoxication, uncomplicated: Secondary | ICD-10-CM | POA: Diagnosis present

## 2022-04-07 DIAGNOSIS — I10 Essential (primary) hypertension: Secondary | ICD-10-CM | POA: Diagnosis present

## 2022-04-07 DIAGNOSIS — S22089A Unspecified fracture of T11-T12 vertebra, initial encounter for closed fracture: Secondary | ICD-10-CM | POA: Diagnosis present

## 2022-04-07 LAB — SAMPLE TO BLOOD BANK

## 2022-04-07 MED ORDER — MORPHINE SULFATE (PF) 4 MG/ML IV SOLN
4.0000 mg | Freq: Once | INTRAVENOUS | Status: AC
  Administered 2022-04-08: 4 mg via INTRAVENOUS
  Filled 2022-04-07: qty 1
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
## 2022-04-08 ENCOUNTER — Other Ambulatory Visit: Payer: Self-pay

## 2022-04-08 ENCOUNTER — Emergency Department (HOSPITAL_COMMUNITY): Payer: Medicaid Other

## 2022-04-08 ENCOUNTER — Inpatient Hospital Stay (HOSPITAL_COMMUNITY): Payer: Medicaid Other

## 2022-04-08 ENCOUNTER — Encounter (HOSPITAL_COMMUNITY): Payer: Self-pay | Admitting: Neurological Surgery

## 2022-04-08 ENCOUNTER — Encounter (HOSPITAL_COMMUNITY)
Admission: EM | Disposition: A | Discharge status: Home / Self Care | Payer: Self-pay | Source: Home / Self Care | Attending: Neurological Surgery

## 2022-04-08 ENCOUNTER — Inpatient Hospital Stay (HOSPITAL_COMMUNITY): Payer: Medicaid Other | Admitting: Certified Registered"

## 2022-04-08 DIAGNOSIS — F32A Depression, unspecified: Secondary | ICD-10-CM | POA: Diagnosis present

## 2022-04-08 DIAGNOSIS — S2242XA Multiple fractures of ribs, left side, initial encounter for closed fracture: Secondary | ICD-10-CM | POA: Diagnosis present

## 2022-04-08 DIAGNOSIS — Z79899 Other long term (current) drug therapy: Secondary | ICD-10-CM | POA: Diagnosis not present

## 2022-04-08 DIAGNOSIS — K5900 Constipation, unspecified: Secondary | ICD-10-CM | POA: Diagnosis not present

## 2022-04-08 DIAGNOSIS — S32018A Other fracture of first lumbar vertebra, initial encounter for closed fracture: Secondary | ICD-10-CM | POA: Diagnosis present

## 2022-04-08 DIAGNOSIS — Z8614 Personal history of Methicillin resistant Staphylococcus aureus infection: Secondary | ICD-10-CM | POA: Diagnosis not present

## 2022-04-08 DIAGNOSIS — F17210 Nicotine dependence, cigarettes, uncomplicated: Secondary | ICD-10-CM | POA: Diagnosis present

## 2022-04-08 DIAGNOSIS — F10220 Alcohol dependence with intoxication, uncomplicated: Secondary | ICD-10-CM | POA: Diagnosis present

## 2022-04-08 DIAGNOSIS — I10 Essential (primary) hypertension: Secondary | ICD-10-CM | POA: Diagnosis present

## 2022-04-08 DIAGNOSIS — Y92410 Unspecified street and highway as the place of occurrence of the external cause: Secondary | ICD-10-CM | POA: Diagnosis not present

## 2022-04-08 DIAGNOSIS — S22089A Unspecified fracture of T11-T12 vertebra, initial encounter for closed fracture: Secondary | ICD-10-CM | POA: Diagnosis present

## 2022-04-08 DIAGNOSIS — S32012A Unstable burst fracture of first lumbar vertebra, initial encounter for closed fracture: Secondary | ICD-10-CM

## 2022-04-08 DIAGNOSIS — S32009A Unspecified fracture of unspecified lumbar vertebra, initial encounter for closed fracture: Secondary | ICD-10-CM | POA: Diagnosis present

## 2022-04-08 DIAGNOSIS — F1910 Other psychoactive substance abuse, uncomplicated: Secondary | ICD-10-CM | POA: Diagnosis present

## 2022-04-08 LAB — COMPREHENSIVE METABOLIC PANEL
ALT: 24 U/L (ref 0–44)
AST: 30 U/L (ref 15–41)
Albumin: 3.2 g/dL — ABNORMAL LOW (ref 3.5–5.0)
Alkaline Phosphatase: 73 U/L (ref 38–126)
Anion gap: 11 (ref 5–15)
BUN: 12 mg/dL (ref 6–20)
CO2: 22 mmol/L (ref 22–32)
Calcium: 8.2 mg/dL — ABNORMAL LOW (ref 8.9–10.3)
Chloride: 112 mmol/L — ABNORMAL HIGH (ref 98–111)
Creatinine, Ser: 1 mg/dL (ref 0.61–1.24)
GFR, Estimated: >60 mL/min (ref >60)
Glucose, Bld: 109 mg/dL — ABNORMAL HIGH (ref 70–99)
Potassium: 3.6 mmol/L (ref 3.5–5.1)
Sodium: 145 mmol/L (ref 135–145)
Total Bilirubin: 0.5 mg/dL (ref 0.3–1.2)
Total Protein: 6.1 g/dL — ABNORMAL LOW (ref 6.5–8.1)

## 2022-04-08 LAB — CBC WITH DIFFERENTIAL/PLATELET
Abs Immature Granulocytes: 0.04 10*3/uL (ref 0.00–0.07)
Basophils Absolute: 0 10*3/uL (ref 0.0–0.1)
Basophils Relative: 0 %
Eosinophils Absolute: 0.1 10*3/uL (ref 0.0–0.5)
Eosinophils Relative: 1 %
HCT: 43.4 % (ref 39.0–52.0)
Hemoglobin: 14.6 g/dL (ref 13.0–17.0)
Immature Granulocytes: 0 %
Lymphocytes Relative: 22 %
Lymphs Abs: 2.2 10*3/uL (ref 0.7–4.0)
MCH: 32.5 pg (ref 26.0–34.0)
MCHC: 33.6 g/dL (ref 30.0–36.0)
MCV: 96.7 fL (ref 80.0–100.0)
Monocytes Absolute: 0.7 10*3/uL (ref 0.1–1.0)
Monocytes Relative: 7 %
Neutro Abs: 7.1 10*3/uL (ref 1.7–7.7)
Neutrophils Relative %: 70 %
Platelets: 244 10*3/uL (ref 150–400)
RBC: 4.49 MIL/uL (ref 4.22–5.81)
RDW: 14.6 % (ref 11.5–15.5)
WBC: 10.2 10*3/uL (ref 4.0–10.5)
nRBC: 0 % (ref 0.0–0.2)

## 2022-04-08 LAB — SURGICAL PCR SCREEN
MRSA, PCR: NEGATIVE
Staphylococcus aureus: POSITIVE — AB

## 2022-04-08 LAB — PROTIME-INR
INR: 1 (ref 0.8–1.2)
Prothrombin Time: 13.3 seconds (ref 11.4–15.2)

## 2022-04-08 LAB — RAPID URINE DRUG SCREEN, HOSP PERFORMED
Amphetamines: NOT DETECTED
Barbiturates: NOT DETECTED
Benzodiazepines: POSITIVE — AB
Cocaine: POSITIVE — AB
Opiates: POSITIVE — AB
Tetrahydrocannabinol: POSITIVE — AB

## 2022-04-08 LAB — URINALYSIS, ROUTINE W REFLEX MICROSCOPIC
Bilirubin Urine: NEGATIVE
Glucose, UA: NEGATIVE mg/dL
Hgb urine dipstick: NEGATIVE
Ketones, ur: NEGATIVE mg/dL
Leukocytes,Ua: NEGATIVE
Nitrite: NEGATIVE
Protein, ur: NEGATIVE mg/dL
Specific Gravity, Urine: >1.046 — ABNORMAL HIGH (ref 1.005–1.030)
pH: 5 (ref 5.0–8.0)

## 2022-04-08 LAB — TYPE AND SCREEN
ABO/RH(D): AB POS
Antibody Screen: NEGATIVE

## 2022-04-08 LAB — ABO/RH: ABO/RH(D): AB POS

## 2022-04-08 LAB — ETHANOL: Alcohol, Ethyl (B): 276 mg/dL — ABNORMAL HIGH (ref <10)

## 2022-04-08 SURGERY — POSTERIOR LUMBAR FUSION 1 LEVEL
Anesthesia: General | Site: Back

## 2022-04-08 MED ORDER — FENTANYL CITRATE (PF) 250 MCG/5ML IJ SOLN
INTRAMUSCULAR | Status: AC
  Filled 2022-04-08: qty 5

## 2022-04-08 MED ORDER — HYDROMORPHONE HCL 1 MG/ML IJ SOLN
1.0000 mg | INTRAMUSCULAR | Status: DC | PRN
  Administered 2022-04-08 (×5): 1 mg via INTRAVENOUS
  Filled 2022-04-08 (×5): qty 1

## 2022-04-08 MED ORDER — MORPHINE SULFATE (PF) 4 MG/ML IV SOLN: 4.0000 mg | Freq: Once | INTRAVENOUS | Status: DC

## 2022-04-08 MED ORDER — SODIUM CHLORIDE 0.9% FLUSH
3.0000 mL | INTRAVENOUS | Status: DC | PRN
  Administered 2022-04-13 – 2022-04-15 (×2): 3 mL via INTRAVENOUS

## 2022-04-08 MED ORDER — LORAZEPAM 1 MG PO TABS: 1.0000 mg | ORAL_TABLET | ORAL | Status: AC | PRN

## 2022-04-08 MED ORDER — THROMBIN 5000 UNITS EX SOLR
OROMUCOSAL | Status: DC | PRN
  Administered 2022-04-08: 5 mL via TOPICAL

## 2022-04-08 MED ORDER — DEXAMETHASONE SODIUM PHOSPHATE 10 MG/ML IJ SOLN
INTRAMUSCULAR | Status: DC | PRN
  Administered 2022-04-08: 10 mg via INTRAVENOUS

## 2022-04-08 MED ORDER — OXYCODONE HCL 5 MG PO TABS: 5.0000 mg | ORAL_TABLET | ORAL | Status: DC | PRN

## 2022-04-08 MED ORDER — SODIUM CHLORIDE 0.9% FLUSH: 3.0000 mL | INTRAVENOUS | Status: DC | PRN

## 2022-04-08 MED ORDER — IOHEXOL 350 MG/ML SOLN
75.0000 mL | Freq: Once | INTRAVENOUS | Status: AC | PRN
  Administered 2022-04-08: 75 mL via INTRAVENOUS

## 2022-04-08 MED ORDER — PHENOL 1.4 % MT LIQD: 1.0000 | OROMUCOSAL | Status: DC | PRN

## 2022-04-08 MED ORDER — SODIUM CHLORIDE 0.9% FLUSH
3.0000 mL | Freq: Two times a day (BID) | INTRAVENOUS | Status: DC
  Administered 2022-04-08: 3 mL via INTRAVENOUS

## 2022-04-08 MED ORDER — ROCURONIUM BROMIDE 10 MG/ML (PF) SYRINGE
PREFILLED_SYRINGE | INTRAVENOUS | Status: AC
  Filled 2022-04-08: qty 10

## 2022-04-08 MED ORDER — CEFAZOLIN SODIUM-DEXTROSE 2-4 GM/100ML-% IV SOLN
INTRAVENOUS | Status: AC
  Filled 2022-04-08: qty 100

## 2022-04-08 MED ORDER — THROMBIN 5000 UNITS EX SOLR
CUTANEOUS | Status: AC
  Filled 2022-04-08: qty 5000

## 2022-04-08 MED ORDER — BUPIVACAINE HCL (PF) 0.5 % IJ SOLN
INTRAMUSCULAR | Status: AC
  Filled 2022-04-08: qty 30

## 2022-04-08 MED ORDER — LACTATED RINGERS IV SOLN: INTRAVENOUS | Status: DC

## 2022-04-08 MED ORDER — POLYETHYLENE GLYCOL 3350 17 G PO PACK: 17.0000 g | PACK | Freq: Every day | ORAL | Status: DC | PRN

## 2022-04-08 MED ORDER — ONDANSETRON HCL 4 MG/2ML IJ SOLN
INTRAMUSCULAR | Status: DC | PRN
  Administered 2022-04-08: 4 mg via INTRAVENOUS

## 2022-04-08 MED ORDER — DEXMEDETOMIDINE HCL IN NACL 200 MCG/50ML IV SOLN
INTRAVENOUS | Status: DC | PRN
  Administered 2022-04-08 (×3): 4 ug via INTRAVENOUS

## 2022-04-08 MED ORDER — NALOXONE HCL 0.4 MG/ML IJ SOLN: 0.4000 mg | INTRAMUSCULAR | Status: DC | PRN

## 2022-04-08 MED ORDER — ACETAMINOPHEN 650 MG RE SUPP: 650.0000 mg | RECTAL | Status: DC | PRN

## 2022-04-08 MED ORDER — LACTATED RINGERS IV SOLN: INTRAVENOUS | Status: DC | PRN

## 2022-04-08 MED ORDER — LIDOCAINE-EPINEPHRINE 1 %-1:100000 IJ SOLN
INTRAMUSCULAR | Status: DC | PRN
  Administered 2022-04-08: 5 mL via INTRADERMAL

## 2022-04-08 MED ORDER — CYCLOBENZAPRINE HCL 10 MG PO TABS
10.0000 mg | ORAL_TABLET | Freq: Three times a day (TID) | ORAL | Status: DC | PRN
  Administered 2022-04-08: 10 mg via ORAL
  Filled 2022-04-08: qty 1

## 2022-04-08 MED ORDER — THIAMINE MONONITRATE 100 MG PO TABS
100.0000 mg | ORAL_TABLET | Freq: Every day | ORAL | Status: DC
  Administered 2022-04-09 – 2022-04-15 (×7): 100 mg via ORAL
  Filled 2022-04-08 (×7): qty 1

## 2022-04-08 MED ORDER — PROPOFOL 10 MG/ML IV BOLUS
INTRAVENOUS | Status: AC
  Filled 2022-04-08: qty 20

## 2022-04-08 MED ORDER — ONDANSETRON HCL 4 MG/2ML IJ SOLN
4.0000 mg | Freq: Four times a day (QID) | INTRAMUSCULAR | Status: DC | PRN
  Administered 2022-04-10: 4 mg via INTRAVENOUS

## 2022-04-08 MED ORDER — SUCCINYLCHOLINE CHLORIDE 200 MG/10ML IV SOSY
PREFILLED_SYRINGE | INTRAVENOUS | Status: AC
  Filled 2022-04-08: qty 10

## 2022-04-08 MED ORDER — SODIUM CHLORIDE 0.9 % IV SOLN: INTRAVENOUS | Status: DC

## 2022-04-08 MED ORDER — THIAMINE HCL 100 MG/ML IJ SOLN: 100.0000 mg | Freq: Every day | INTRAMUSCULAR | Status: DC

## 2022-04-08 MED ORDER — SODIUM CHLORIDE 0.9% FLUSH
3.0000 mL | Freq: Two times a day (BID) | INTRAVENOUS | Status: DC
  Administered 2022-04-09 – 2022-04-11 (×6): 3 mL via INTRAVENOUS
  Administered 2022-04-11: 10 mL via INTRAVENOUS
  Administered 2022-04-12 – 2022-04-15 (×7): 3 mL via INTRAVENOUS

## 2022-04-08 MED ORDER — PHENYLEPHRINE 80 MCG/ML (10ML) SYRINGE FOR IV PUSH (FOR BLOOD PRESSURE SUPPORT)
PREFILLED_SYRINGE | INTRAVENOUS | Status: AC
  Filled 2022-04-08: qty 20

## 2022-04-08 MED ORDER — SODIUM CHLORIDE 0.9% FLUSH: 9.0000 mL | INTRAVENOUS | Status: DC | PRN

## 2022-04-08 MED ORDER — PROPOFOL 10 MG/ML IV BOLUS
INTRAVENOUS | Status: DC | PRN
  Administered 2022-04-08: 200 mg via INTRAVENOUS

## 2022-04-08 MED ORDER — MENTHOL 3 MG MT LOZG: 1.0000 | LOZENGE | OROMUCOSAL | Status: DC | PRN

## 2022-04-08 MED ORDER — LIDOCAINE HCL (CARDIAC) PF 100 MG/5ML IV SOSY
PREFILLED_SYRINGE | INTRAVENOUS | Status: DC | PRN
  Administered 2022-04-08: 60 mg via INTRAVENOUS

## 2022-04-08 MED ORDER — FENTANYL CITRATE (PF) 250 MCG/5ML IJ SOLN
INTRAMUSCULAR | Status: DC | PRN
  Administered 2022-04-08: 50 ug via INTRAVENOUS
  Administered 2022-04-08: 150 ug via INTRAVENOUS
  Administered 2022-04-08: 100 ug via INTRAVENOUS
  Administered 2022-04-08: 50 ug via INTRAVENOUS

## 2022-04-08 MED ORDER — LIDOCAINE 2% (20 MG/ML) 5 ML SYRINGE
INTRAMUSCULAR | Status: AC
  Filled 2022-04-08: qty 5

## 2022-04-08 MED ORDER — CHLORHEXIDINE GLUCONATE 0.12 % MT SOLN
OROMUCOSAL | Status: AC
  Administered 2022-04-08: 15 mL via OROMUCOSAL
  Filled 2022-04-08: qty 15

## 2022-04-08 MED ORDER — MIDAZOLAM HCL 2 MG/2ML IJ SOLN
INTRAMUSCULAR | Status: AC
  Filled 2022-04-08: qty 2

## 2022-04-08 MED ORDER — PHENYLEPHRINE HCL-NACL 20-0.9 MG/250ML-% IV SOLN
INTRAVENOUS | Status: DC | PRN
  Administered 2022-04-08: 25 ug/min via INTRAVENOUS

## 2022-04-08 MED ORDER — ACETAMINOPHEN 325 MG PO TABS
650.0000 mg | ORAL_TABLET | ORAL | Status: DC | PRN
  Administered 2022-04-14: 650 mg via ORAL
  Filled 2022-04-08 (×2): qty 2

## 2022-04-08 MED ORDER — GLYCOPYRROLATE PF 0.2 MG/ML IJ SOSY
PREFILLED_SYRINGE | INTRAMUSCULAR | Status: AC
  Filled 2022-04-08: qty 3

## 2022-04-08 MED ORDER — LORAZEPAM 2 MG/ML IJ SOLN: 1.0000 mg | INTRAMUSCULAR | Status: AC | PRN

## 2022-04-08 MED ORDER — ONDANSETRON HCL 4 MG/2ML IJ SOLN
INTRAMUSCULAR | Status: AC
  Filled 2022-04-08: qty 2

## 2022-04-08 MED ORDER — ONDANSETRON HCL 4 MG/2ML IJ SOLN
4.0000 mg | Freq: Four times a day (QID) | INTRAMUSCULAR | Status: DC | PRN
  Filled 2022-04-08: qty 2

## 2022-04-08 MED ORDER — CEFAZOLIN SODIUM-DEXTROSE 2-4 GM/100ML-% IV SOLN
2.0000 g | Freq: Three times a day (TID) | INTRAVENOUS | Status: AC
  Administered 2022-04-09 (×2): 2 g via INTRAVENOUS
  Filled 2022-04-08 (×2): qty 100

## 2022-04-08 MED ORDER — ONDANSETRON HCL 4 MG PO TABS: 4.0000 mg | ORAL_TABLET | Freq: Four times a day (QID) | ORAL | Status: DC | PRN

## 2022-04-08 MED ORDER — ORAL CARE MOUTH RINSE: 15.0000 mL | Freq: Once | OROMUCOSAL | Status: AC

## 2022-04-08 MED ORDER — CEFAZOLIN SODIUM-DEXTROSE 2-4 GM/100ML-% IV SOLN
2.0000 g | INTRAVENOUS | Status: AC
  Administered 2022-04-08: 2 g via INTRAVENOUS

## 2022-04-08 MED ORDER — LIDOCAINE-EPINEPHRINE 1 %-1:100000 IJ SOLN
INTRAMUSCULAR | Status: AC
  Filled 2022-04-08: qty 1

## 2022-04-08 MED ORDER — MIDAZOLAM HCL 2 MG/2ML IJ SOLN
INTRAMUSCULAR | Status: DC | PRN
  Administered 2022-04-08: 2 mg via INTRAVENOUS

## 2022-04-08 MED ORDER — EPHEDRINE 5 MG/ML INJ
INTRAVENOUS | Status: AC
  Filled 2022-04-08: qty 10

## 2022-04-08 MED ORDER — FOLIC ACID 1 MG PO TABS
1.0000 mg | ORAL_TABLET | Freq: Every day | ORAL | Status: DC
  Administered 2022-04-09 – 2022-04-15 (×7): 1 mg via ORAL
  Filled 2022-04-08 (×7): qty 1

## 2022-04-08 MED ORDER — SODIUM CHLORIDE 0.9 % IV SOLN: 250.0000 mL | INTRAVENOUS | Status: DC

## 2022-04-08 MED ORDER — ONDANSETRON HCL 4 MG/2ML IJ SOLN
4.0000 mg | Freq: Four times a day (QID) | INTRAMUSCULAR | Status: DC | PRN
  Administered 2022-04-08: 4 mg via INTRAVENOUS
  Filled 2022-04-08: qty 2

## 2022-04-08 MED ORDER — DOCUSATE SODIUM 100 MG PO CAPS
100.0000 mg | ORAL_CAPSULE | Freq: Two times a day (BID) | ORAL | Status: DC
  Administered 2022-04-08: 100 mg via ORAL
  Filled 2022-04-08: qty 1

## 2022-04-08 MED ORDER — DIPHENHYDRAMINE HCL 12.5 MG/5ML PO ELIX
12.5000 mg | ORAL_SOLUTION | Freq: Four times a day (QID) | ORAL | Status: DC | PRN
  Administered 2022-04-13 – 2022-04-14 (×4): 12.5 mg via ORAL
  Filled 2022-04-08 (×4): qty 10

## 2022-04-08 MED ORDER — HYDROMORPHONE 1 MG/ML IV SOLN
INTRAVENOUS | Status: DC
  Administered 2022-04-09: 30 mg via INTRAVENOUS
  Administered 2022-04-09: 2.1 mg via INTRAVENOUS
  Administered 2022-04-09: 1.8 mg via INTRAVENOUS
  Administered 2022-04-10: 3 mg via INTRAVENOUS
  Administered 2022-04-10: 5.1 mg via INTRAVENOUS
  Filled 2022-04-08: qty 30

## 2022-04-08 MED ORDER — POLYETHYLENE GLYCOL 3350 17 G PO PACK
17.0000 g | PACK | Freq: Every day | ORAL | Status: DC | PRN
  Administered 2022-04-10: 17 g via ORAL
  Filled 2022-04-08: qty 1

## 2022-04-08 MED ORDER — DIPHENHYDRAMINE HCL 50 MG/ML IJ SOLN
12.5000 mg | Freq: Four times a day (QID) | INTRAMUSCULAR | Status: DC | PRN
  Administered 2022-04-14: 12.5 mg via INTRAVENOUS
  Filled 2022-04-08: qty 1

## 2022-04-08 MED ORDER — ACETAMINOPHEN 325 MG PO TABS: 650.0000 mg | ORAL_TABLET | ORAL | Status: DC | PRN

## 2022-04-08 MED ORDER — MORPHINE SULFATE (PF) 4 MG/ML IV SOLN
4.0000 mg | Freq: Once | INTRAVENOUS | Status: AC
  Administered 2022-04-08: 4 mg via INTRAVENOUS
  Filled 2022-04-08: qty 1

## 2022-04-08 MED ORDER — ROCURONIUM BROMIDE 100 MG/10ML IV SOLN
INTRAVENOUS | Status: DC | PRN
  Administered 2022-04-08: 20 mg via INTRAVENOUS
  Administered 2022-04-08: 60 mg via INTRAVENOUS
  Administered 2022-04-08: 20 mg via INTRAVENOUS

## 2022-04-08 MED ORDER — KETAMINE HCL 10 MG/ML IJ SOLN
INTRAMUSCULAR | Status: DC | PRN
  Administered 2022-04-08 (×2): 10 mg via INTRAVENOUS

## 2022-04-08 MED ORDER — DOCUSATE SODIUM 100 MG PO CAPS
100.0000 mg | ORAL_CAPSULE | Freq: Two times a day (BID) | ORAL | Status: DC
  Administered 2022-04-09 – 2022-04-14 (×13): 100 mg via ORAL
  Filled 2022-04-08 (×14): qty 1

## 2022-04-08 MED ORDER — 0.9 % SODIUM CHLORIDE (POUR BTL) OPTIME
TOPICAL | Status: DC | PRN
  Administered 2022-04-08: 1000 mL

## 2022-04-08 MED ORDER — SODIUM CHLORIDE 0.9 % IV SOLN
250.0000 mL | INTRAVENOUS | Status: DC
  Administered 2022-04-09: 250 mL via INTRAVENOUS

## 2022-04-08 MED ORDER — ADULT MULTIVITAMIN W/MINERALS CH
1.0000 | ORAL_TABLET | Freq: Every day | ORAL | Status: DC
  Administered 2022-04-09 – 2022-04-15 (×7): 1 via ORAL
  Filled 2022-04-08 (×7): qty 1

## 2022-04-08 MED ORDER — KETAMINE HCL 50 MG/5ML IJ SOSY
PREFILLED_SYRINGE | INTRAMUSCULAR | Status: AC
  Filled 2022-04-08: qty 10

## 2022-04-08 MED ORDER — BUPIVACAINE HCL (PF) 0.5 % IJ SOLN
INTRAMUSCULAR | Status: DC | PRN
  Administered 2022-04-08: 5 mL

## 2022-04-08 MED ORDER — SUGAMMADEX SODIUM 200 MG/2ML IV SOLN
INTRAVENOUS | Status: DC | PRN
  Administered 2022-04-08: 200 mg via INTRAVENOUS

## 2022-04-08 MED ORDER — OXYCODONE HCL 5 MG PO TABS
10.0000 mg | ORAL_TABLET | ORAL | Status: DC | PRN
  Administered 2022-04-08: 10 mg via ORAL
  Filled 2022-04-08: qty 2

## 2022-04-08 MED ORDER — CHLORHEXIDINE GLUCONATE 0.12 % MT SOLN: 15.0000 mL | Freq: Once | OROMUCOSAL | Status: AC

## 2022-04-08 MED ORDER — CYCLOBENZAPRINE HCL 10 MG PO TABS
10.0000 mg | ORAL_TABLET | Freq: Three times a day (TID) | ORAL | Status: DC | PRN
  Administered 2022-04-09: 10 mg via ORAL
  Filled 2022-04-08: qty 1

## 2022-04-08 MED ORDER — HEPARIN SODIUM (PORCINE) 5000 UNIT/ML IJ SOLN
5000.0000 [IU] | Freq: Three times a day (TID) | INTRAMUSCULAR | Status: DC
  Administered 2022-04-10 – 2022-04-15 (×15): 5000 [IU] via SUBCUTANEOUS
  Filled 2022-04-08 (×16): qty 1

## 2022-04-08 MED ORDER — DEXMEDETOMIDINE HCL IN NACL 80 MCG/20ML IV SOLN
INTRAVENOUS | Status: AC
  Filled 2022-04-08: qty 20

## 2022-04-08 SURGICAL SUPPLY — 82 items
ADH SKN CLS APL DERMABOND .7 (GAUZE/BANDAGES/DRESSINGS) ×2
ADH SKN CLS LQ APL DERMABOND (GAUZE/BANDAGES/DRESSINGS) ×2
APL SKNCLS STERI-STRIP NONHPOA (GAUZE/BANDAGES/DRESSINGS)
BAG COUNTER SPONGE SURGICOUNT (BAG) ×2 IMPLANT
BAG SPNG CNTER NS LX DISP (BAG) ×2
BASKET BONE COLLECTION (BASKET) ×2 IMPLANT
BENZOIN TINCTURE PRP APPL 2/3 (GAUZE/BANDAGES/DRESSINGS) IMPLANT
BLADE CLIPPER SURG (BLADE) IMPLANT
BLADE SURG 11 STRL SS (BLADE) ×2 IMPLANT
BUR 14 MATCH 3 (BUR) IMPLANT
BUR MATCHSTICK NEURO 3.0 LAGG (BURR) ×2 IMPLANT
BUR MR8 14CM BALL SYMTRI 5 (BUR) IMPLANT
BUR PRECISION FLUTE 5.0 (BURR) ×2 IMPLANT
BURR 14 MATCH 3 (BUR) ×2
BURR MR8 14CM BALL SYMTRI 5 (BUR)
CANISTER SUCT 3000ML PPV (MISCELLANEOUS) ×2 IMPLANT
CNTNR URN SCR LID CUP LEK RST (MISCELLANEOUS) ×2 IMPLANT
CONT SPEC 4OZ STRL OR WHT (MISCELLANEOUS) ×2
COVER BACK TABLE 60X90IN (DRAPES) ×2 IMPLANT
COVERAGE SUPPORT O-ARM STEALTH (MISCELLANEOUS) ×2 IMPLANT
DERMABOND ADVANCED .7 DNX12 (GAUZE/BANDAGES/DRESSINGS) ×2 IMPLANT
DERMABOND ADVANCED .7 DNX6 (GAUZE/BANDAGES/DRESSINGS) IMPLANT
DRAPE C-ARM 42X72 X-RAY (DRAPES) IMPLANT
DRAPE C-ARMOR (DRAPES) IMPLANT
DRAPE LAPAROTOMY 100X72X124 (DRAPES) ×2 IMPLANT
DRAPE SHEET LG 3/4 BI-LAMINATE (DRAPES) ×8 IMPLANT
DRAPE SURG 17X23 STRL (DRAPES) ×2 IMPLANT
DURAPREP 26ML APPLICATOR (WOUND CARE) ×2 IMPLANT
ELECT REM PT RETURN 9FT ADLT (ELECTROSURGICAL) ×2
ELECTRODE REM PT RTRN 9FT ADLT (ELECTROSURGICAL) ×2 IMPLANT
FEE COVERAGE SUPPORT O-ARM (MISCELLANEOUS) IMPLANT
FEE SRVC MNAV LABOR AFTER HRS (MISCELLANEOUS) IMPLANT
GAUZE 4X4 16PLY ~~LOC~~+RFID DBL (SPONGE) IMPLANT
GAUZE SPONGE 4X4 12PLY STRL (GAUZE/BANDAGES/DRESSINGS) IMPLANT
GLOVE BIO SURGEON STRL SZ7 (GLOVE) IMPLANT
GLOVE BIOGEL PI IND STRL 7.5 (GLOVE) ×4 IMPLANT
GLOVE ECLIPSE 7.5 STRL STRAW (GLOVE) ×4 IMPLANT
GLOVE EXAM NITRILE LRG STRL (GLOVE) IMPLANT
GLOVE EXAM NITRILE XL STR (GLOVE) IMPLANT
GLOVE EXAM NITRILE XS STR PU (GLOVE) IMPLANT
GOWN STRL REUS W/ TWL LRG LVL3 (GOWN DISPOSABLE) ×8 IMPLANT
GOWN STRL REUS W/ TWL XL LVL3 (GOWN DISPOSABLE) IMPLANT
GOWN STRL REUS W/TWL 2XL LVL3 (GOWN DISPOSABLE) IMPLANT
GOWN STRL REUS W/TWL LRG LVL3 (GOWN DISPOSABLE) ×8
GOWN STRL REUS W/TWL XL LVL3 (GOWN DISPOSABLE) ×2
GRAFT BN 5X1XSPNE CVD POST DBM (Bone Implant) IMPLANT
GRAFT BONE MAGNIFUSE 1X5CM (Bone Implant) ×6 IMPLANT
HEMOSTAT POWDER KIT SURGIFOAM (HEMOSTASIS) ×2 IMPLANT
KIT BASIN OR (CUSTOM PROCEDURE TRAY) ×2 IMPLANT
KIT POSITION SURG JACKSON T1 (MISCELLANEOUS) ×2 IMPLANT
KIT TURNOVER KIT B (KITS) ×2 IMPLANT
MARKER SPHERE PSV REFLC NDI (MISCELLANEOUS) ×10 IMPLANT
MILL BONE PREP (MISCELLANEOUS) ×2 IMPLANT
NDL HYPO 18GX1.5 BLUNT FILL (NEEDLE) IMPLANT
NDL SPNL 18GX3.5 QUINCKE PK (NEEDLE) IMPLANT
NEEDLE HYPO 18GX1.5 BLUNT FILL (NEEDLE) IMPLANT
NEEDLE HYPO 22GX1.5 SAFETY (NEEDLE) ×2 IMPLANT
NEEDLE SPNL 18GX3.5 QUINCKE PK (NEEDLE) IMPLANT
NS IRRIG 1000ML POUR BTL (IV SOLUTION) ×2 IMPLANT
PACK LAMINECTOMY NEURO (CUSTOM PROCEDURE TRAY) ×2 IMPLANT
PAD ARMBOARD 7.5X6 YLW CONV (MISCELLANEOUS) ×6 IMPLANT
ROD 5.5MM SPINAL SOLERA (Rod) IMPLANT
SCREW 6.5X40 (Screw) IMPLANT
SCREW SET SOLERA (Screw) ×20 IMPLANT
SCREW SET SOLERA TI5.5 (Screw) IMPLANT
SCREW SOLERA 45X6.5XMA NS SPNE (Screw) IMPLANT
SCREW SOLERA 6.5X45MM (Screw) ×4 IMPLANT
SCREW SOLERA 6.5X50 (Screw) IMPLANT
SERVICE MNAV LABOR AFTER HRS (MISCELLANEOUS) ×4 IMPLANT
SPIKE FLUID TRANSFER (MISCELLANEOUS) ×2 IMPLANT
SPONGE SURGIFOAM ABS GEL 100 (HEMOSTASIS) IMPLANT
SPONGE T-LAP 4X18 ~~LOC~~+RFID (SPONGE) IMPLANT
STRIP CLOSURE SKIN 1/2X4 (GAUZE/BANDAGES/DRESSINGS) IMPLANT
SUT MNCRL AB 3-0 PS2 18 (SUTURE) ×2 IMPLANT
SUT VIC AB 0 CT1 18XCR BRD8 (SUTURE) ×2 IMPLANT
SUT VIC AB 0 CT1 8-18 (SUTURE) ×4
SUT VIC AB 2-0 CP2 18 (SUTURE) ×2 IMPLANT
SYR 3ML LL SCALE MARK (SYRINGE) IMPLANT
TOWEL GREEN STERILE (TOWEL DISPOSABLE) ×2 IMPLANT
TOWEL GREEN STERILE FF (TOWEL DISPOSABLE) ×2 IMPLANT
TRAY FOLEY MTR SLVR 16FR STAT (SET/KITS/TRAYS/PACK) ×2 IMPLANT
WATER STERILE IRR 1000ML POUR (IV SOLUTION) ×2 IMPLANT

## 2022-04-08 NOTE — Anesthesia Postprocedure Evaluation (Signed)
Anesthesia Post Note  Patient: Guy Rodriguez  Procedure(s) Performed: THORACIC ELEVEN-LUMBAR THREE POSTERIOR INSTRUMENTED FUSION (Back) Application of O-Arm     Patient location during evaluation: PACU Anesthesia Type: General Level of consciousness: awake Pain management: pain level controlled Vital Signs Assessment: post-procedure vital signs reviewed and stable Respiratory status: spontaneous breathing, nonlabored ventilation, respiratory function stable and patient connected to nasal cannula oxygen Cardiovascular status: blood pressure returned to baseline and stable Postop Assessment: no apparent nausea or vomiting Anesthetic complications: no   No notable events documented.  Last Vitals:  Vitals:   04/08/22 2312 04/08/22 2325  BP: (!) 144/121 (!) 146/111  Pulse: 78 71  Resp: 10 14  Temp: 36.8 C 36.9 C  SpO2: 96% 96%    Last Pain:  Vitals:   04/08/22 2325  TempSrc: Oral  PainSc: 10-Worst pain ever                 Adianna Darwin P Trew Sunde

## 2022-04-08 NOTE — Anesthesia Preprocedure Evaluation (Addendum)
Anesthesia Evaluation  Patient identified by MRN, date of birth, ID band Patient awake    Reviewed: Allergy & Precautions, H&P , NPO status , Patient's Chart, lab work & pertinent test results  Airway Mallampati: II  TM Distance: >3 FB Neck ROM: Full    Dental no notable dental hx. (+) Poor Dentition, Dental Advisory Given   Pulmonary Current Smoker and Patient abstained from smoking.,    Pulmonary exam normal breath sounds clear to auscultation       Cardiovascular hypertension, Pt. on medications  Rhythm:Regular Rate:Normal     Neuro/Psych Depression negative neurological ROS     GI/Hepatic negative GI ROS, (+)     substance abuse  alcohol use and cocaine use,   Endo/Other  negative endocrine ROS  Renal/GU negative Renal ROS  negative genitourinary   Musculoskeletal   Abdominal   Peds  Hematology negative hematology ROS (+)   Anesthesia Other Findings   Reproductive/Obstetrics negative OB ROS                            Anesthesia Physical Anesthesia Plan  ASA: 2  Anesthesia Plan: General   Post-op Pain Management: Ofirmev IV (intra-op)*, Ketamine IV* and Precedex   Induction: Intravenous  PONV Risk Score and Plan: 2 and Ondansetron, Dexamethasone and Midazolam  Airway Management Planned: Oral ETT  Additional Equipment:   Intra-op Plan:   Post-operative Plan: Extubation in OR  Informed Consent: I have reviewed the patients History and Physical, chart, labs and discussed the procedure including the risks, benefits and alternatives for the proposed anesthesia with the patient or authorized representative who has indicated his/her understanding and acceptance.     Dental advisory given  Plan Discussed with: CRNA  Anesthesia Plan Comments:         Anesthesia Quick Evaluation

## 2022-04-08 NOTE — ED Notes (Signed)
Pt in room A&O x4 whimpering in pain. Reports back pain is a 50/10. Updated on plan of care. Pt able to use urinal.

## 2022-04-08 NOTE — Transfer of Care (Signed)
Immediate Anesthesia Transfer of Care Note  Patient: Guy Rodriguez  Procedure(s) Performed: THORACIC ELEVEN-LUMBAR THREE POSTERIOR INSTRUMENTED FUSION (Back) Application of O-Arm  Patient Location: PACU  Anesthesia Type:General  Level of Consciousness: awake and oriented  Airway & Oxygen Therapy: Patient Spontanous Breathing  Post-op Assessment: Report given to RN and Post -op Vital signs reviewed and stable  Post vital signs: Reviewed and stable  Last Vitals:  Vitals Value Taken Time  BP 168/112 04/08/22 2201  Temp    Pulse 101 04/08/22 2208  Resp 18 04/08/22 2208  SpO2 90 % 04/08/22 2208  Vitals shown include unvalidated device data.  Last Pain:  Vitals:   04/08/22 1751  TempSrc:   PainSc: 10-Worst pain ever      Patients Stated Pain Goal: 3 (74/08/14 4818)  Complications: No notable events documented.

## 2022-04-08 NOTE — ED Notes (Signed)
Pt still in a lot of pain. No new update regarding surgery. Pt given ice chips. Tolerated well. Updated on plan of care.

## 2022-04-08 NOTE — ED Notes (Signed)
Report given to Marissa, RN

## 2022-04-08 NOTE — Anesthesia Procedure Notes (Signed)
Procedure Name: Intubation Date/Time: 04/08/2022 7:25 PM  Performed by: Valetta Fuller, CRNAPre-anesthesia Checklist: Patient identified, Emergency Drugs available, Suction available and Patient being monitored Patient Re-evaluated:Patient Re-evaluated prior to induction Oxygen Delivery Method: Circle system utilized Preoxygenation: Pre-oxygenation with 100% oxygen Induction Type: IV induction Ventilation: Mask ventilation without difficulty Laryngoscope Size: Miller and 2 Grade View: Grade I Tube type: Oral Tube size: 8.0 mm Number of attempts: 1 Airway Equipment and Method: Stylet Placement Confirmation: ETT inserted through vocal cords under direct vision, positive ETCO2 and breath sounds checked- equal and bilateral Secured at: 24 cm Tube secured with: Tape Dental Injury: Teeth and Oropharynx as per pre-operative assessment
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
## 2022-04-09 LAB — CDS SEROLOGY

## 2022-04-09 MED ORDER — CHLORHEXIDINE GLUCONATE CLOTH 2 % EX PADS
6.0000 | MEDICATED_PAD | Freq: Every day | CUTANEOUS | Status: AC
  Administered 2022-04-09 – 2022-04-13 (×5): 6 via TOPICAL

## 2022-04-09 MED ORDER — MUPIROCIN 2 % EX OINT
1.0000 | TOPICAL_OINTMENT | Freq: Two times a day (BID) | CUTANEOUS | Status: AC
  Administered 2022-04-09 – 2022-04-13 (×10): 1 via NASAL
  Filled 2022-04-09 (×2): qty 22
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
## 2022-04-09 NOTE — Progress Notes (Signed)
Orthopedic Tech Progress Note Patient Details:  Guy Rodriguez 1980-10-12 [id number]  Ortho Devices Type of Ortho Device: Thoracolumbar corset (TLSO) Ortho Device/Splint Location: BACK Ortho Device/Splint Interventions: Ordered, Application, Adjustment, Removal   Post Interventions Patient Tolerated: Fair, Well Instructions Provided: Care of device  Janit Pagan 04/09/2022, 12:52 PM
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
## 2022-04-10 MED ORDER — OXYCODONE HCL 5 MG PO TABS: 5.0000 mg | ORAL_TABLET | ORAL | Status: DC | PRN

## 2022-04-10 MED ORDER — HYDROMORPHONE HCL 1 MG/ML IJ SOLN
0.5000 mg | INTRAMUSCULAR | Status: DC | PRN
  Administered 2022-04-10 – 2022-04-15 (×29): 0.5 mg via INTRAVENOUS
  Filled 2022-04-10 (×29): qty 0.5

## 2022-04-10 MED ORDER — METHOCARBAMOL 750 MG PO TABS
750.0000 mg | ORAL_TABLET | Freq: Four times a day (QID) | ORAL | Status: DC | PRN
  Administered 2022-04-10 – 2022-04-15 (×15): 750 mg via ORAL
  Filled 2022-04-10 (×15): qty 1

## 2022-04-10 MED ORDER — OXYCODONE HCL 5 MG PO TABS
10.0000 mg | ORAL_TABLET | ORAL | Status: DC | PRN
  Administered 2022-04-10 – 2022-04-15 (×26): 10 mg via ORAL
  Filled 2022-04-10 (×27): qty 2

## 2022-04-10 MED ORDER — BISACODYL 10 MG RE SUPP
10.0000 mg | Freq: Once | RECTAL | Status: AC
  Administered 2022-04-11: 10 mg via RECTAL
  Filled 2022-04-10: qty 1
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
## 2022-04-10 NOTE — Progress Notes (Signed)
Mobility Specialist Progress Note   04/10/22 1640  Mobility  Activity Ambulated with assistance in hallway  Activity Response Tolerated well  Distance Ambulated (ft) 380 ft  $Mobility charge 1 Mobility  Level of Assistance Minimal assist, patient does 75% or more  Assistive Device Front wheel walker   Received pt in bed c/o discomfort in back(7/10) but agreeable to mobility. Pt able to donn TLSO and mobilize w/o physical assistance. Pain increased as gait progressed this session. Returned back to room where pt needed minA to doff brace and reeducation on back precautions. Left supine in bed w/ needs met and call bell in reach.   Holland Falling Mobility Specialist MS [facility] #:  [phone] Acute Rehab Office:  [phone]

## 2022-04-11 MED ORDER — POLYETHYLENE GLYCOL 3350 17 G PO PACK
17.0000 g | PACK | Freq: Every day | ORAL | Status: DC
  Administered 2022-04-11 – 2022-04-14 (×4): 17 g via ORAL
  Filled 2022-04-11 (×5): qty 1

## 2022-04-11 MED ORDER — MAGNESIUM CITRATE PO SOLN
1.0000 | Freq: Once | ORAL | Status: AC
  Administered 2022-04-11: 1 via ORAL
  Filled 2022-04-11: qty 296

## 2022-04-11 MED ORDER — OXYCODONE HCL 5 MG PO TABS: 5.0000 mg | ORAL_TABLET | ORAL | 0 refills | Status: DC | PRN
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
## 2022-04-11 NOTE — Progress Notes (Signed)
Mobility Specialist Progress Note   04/11/22 1325  Mobility  Activity Ambulated with assistance to bathroom  Activity Response Tolerated well  Distance Ambulated (ft) 12 ft  $Mobility charge 1 Mobility  Level of Assistance Standby assist, set-up cues, supervision of patient - no hands on  Assistive Device  (HHA)   Pt deferring mobility session at this time d/t increased pain in back but did request assistance to get from bed to BR for safety. Required HHA from environment to get to BR but no AD used. Successful void, Left in bed with all needs met, and call bell in reach.  Will return if time permits for a mobility session.   Holland Falling Mobility Specialist MS [facility] #:  [phone] Acute Rehab Office:  [phone]

## 2022-04-11 NOTE — Progress Notes (Signed)
Call received from on-call K. Meyran, NP after paged, pt reports cramping to stomach and no BM since 04/06/22. Abd soft, not distended- VO given for PRN Dulcolax suppository once, when offered to pt he stated that he wishes to wait since it is so close to bedtime, will take in the AM.

## 2022-04-11 NOTE — Progress Notes (Signed)
Pt reports PRN Dulcolax not effective, pain level in stomach/back still at a 10, PRN oral pain med given, pt also educated again on how pain meds can cause constipation and the importance of hydration & mobility.

## 2022-04-11 NOTE — TOC Progression Note (Addendum)
Transition of Care [facility]) - Progression Note    Patient Details  Name: Guy Rodriguez MRN: [id number] Date of Birth: [birth year]  Transition [facility]) CM/SW Contact  Sharin Mons, RN Phone Number: 04/11/2022, 1:11 PM  Clinical Narrative:    NCM spoke with pt regarding d/c planning needs. Pt with polysubstance abuse hx , NCM unable to secure home health services. Pt agreeable  outpatient PT/OT services. Pt states can setup transportation to outpatient rehab. through his Medicaid benefits. Referral made with [employer] for RW, BSC/3 in1.  TOC team will continue to monitor and assist with needs...  Expected Discharge Plan: Home/Self Care Barriers to Discharge: Continued Medical Work up  Expected Discharge Plan and Services Expected Discharge Plan: Home/Self Care In-house Referral: Clinical Social Work   Post Acute Care Choice: [place] Living arrangements for the past 2 months:  (RV)                                       Social Determinants of Health (SDOH) Interventions    Readmission Risk Interventions     No data to display
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
## 2022-04-11 NOTE — Progress Notes (Signed)
Mobility Specialist Progress Note   04/11/22 1720  Mobility  Activity Ambulated with assistance in hallway  Activity Response Tolerated well  Distance Ambulated (ft) 726 ft  $Mobility charge 1 Mobility  Level of Assistance Standby assist, set-up cues, supervision of patient - no hands on  Assistive Device Front wheel walker   Patient received in bed having general pain in back (7/10) but agreeable to participate in mobility. Ambulated in hallway with a steady gait and w/o physical assistance but pain gradually getting worse. Returned to room without incident and was left in chair with all needs met and call bell in reach.   Holland Falling Mobility Specialist MS [facility] #:  [phone] Acute Rehab Office:  [phone]

## 2022-04-11 NOTE — Plan of Care (Signed)
  Problem: Activity: Goal: Ability to avoid complications of mobility impairment will improve Outcome: Progressing Goal: Ability to tolerate increased activity will improve Outcome: Progressing Goal: Will remain free from falls Outcome: Progressing   Problem: Bladder/Genitourinary: Goal: Urinary functional status for postoperative course will improve Outcome: Progressing   Problem: Education: Goal: Knowledge of General Education information will improve Description: Including pain rating scale, medication(s)/side effects and non-pharmacologic comfort measures Outcome: Progressing   Problem: Health Behavior/Discharge Planning: Goal: Ability to manage health-related needs will improve Outcome: Progressing   Problem: Clinical Measurements: Goal: Will remain free from infection Outcome: Progressing
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
## 2022-04-12 NOTE — Progress Notes (Signed)
Mobility Specialist Progress Note   04/12/22 1735  Mobility  Activity Refused mobility   Pt claiming to want to stay in bed for remainder of the day d/t pain w/ therapy earlier today. Will follow up sat or sun. If time permits   Holland Falling Mobility Specialist [place] #:  [phone] Acute Rehab Office:  [phone]
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
## 2022-04-13 ENCOUNTER — Encounter (HOSPITAL_COMMUNITY): Payer: Self-pay | Admitting: Neurological Surgery
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
## 2022-04-14 NOTE — Progress Notes (Addendum)
Mobility Specialist Progress Note   04/14/22 1442  Mobility  Activity Ambulated independently in hallway  Activity Response Tolerated well  Distance Ambulated (ft) 1100 ft  $Mobility charge 1 Mobility  Level of Assistance Modified independent, requires aide device or extra time  Assistive Device Other (Comment) (TLSO)   Patient received in doorway agreeable to participate in mobility. Ambulated in hallway w/o assistance or an AD complimented by a steady gait. Returned to room without complaint or incident. Was left in bed with all needs met, call bell in reach.   Holland Falling Mobility Specialist MS [facility] #:  [phone] Acute Rehab Office:  [phone]

## 2022-04-14 NOTE — Plan of Care (Signed)
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
## 2022-04-15 ENCOUNTER — Other Ambulatory Visit (HOSPITAL_COMMUNITY): Payer: Self-pay

## 2022-04-15 MED ORDER — OXYCODONE HCL 5 MG PO TABS: 5.0000 mg | ORAL_TABLET | Freq: Four times a day (QID) | ORAL | 0 refills | Status: DC | PRN

## 2022-04-15 MED ORDER — OXYCODONE HCL 5 MG PO TABS
5.0000 mg | ORAL_TABLET | Freq: Four times a day (QID) | ORAL | 0 refills | Status: DC | PRN
  Filled 2022-04-15: qty 20, 5d supply, fill #0

## 2022-04-15 NOTE — Plan of Care (Signed)

## 2022-04-15 NOTE — TOC Transition Note (Signed)
Transition of Care [facility]) - CM/SW Discharge Note   Patient Details  Name: Keaton Beichner MRN: [id number] Date of Birth: 1980-08-10  Transition of Care [facility]) CM/SW Contact:  Sharin Mons, RN Phone Number: 04/15/2022, 11:28 AM   Clinical Narrative:    Patient will DC to: home  Anticipated DC date: 04/15/2022 Family notified: yes Transport by: [facility]       - s/p T11-L3 stabilization and fusion, 10/2  Per MD patient ready for DC today.  RN, patient, patient's wife notified of DC.  Wife to assist with care once d/c. Pt will f/u with outpatient PT/OT services with [facility], information noted on AVS. Pt without DME  needs. [place] pharmacy to deliver RX med to bedside prior to d/c. Taxi voucher provided 2/2, no transportation to home or money.  RNCM will sign off for now as intervention is no longer needed. Please consult [place] again if new needs arise.    Final next level of care: Home/Self Care Barriers to Discharge: No Barriers Identified   Patient Goals and CMS Choice Patient states their goals for this hospitalization and ongoing recovery are:: "move without pain"   Choice offered to / list presented to : Patient  Discharge Placement                       Discharge Plan and Services In-house Referral: Clinical Social Work   Post Acute Care Choice: [place]                               Social Determinants of Health (SDOH) Interventions     Readmission Risk Interventions     No data to display

## 2022-04-15 NOTE — Progress Notes (Signed)
Mobility Specialist Progress Note   04/15/22 1325  Mobility  Activity Stood at bedside  Level of Assistance Modified independent, requires aide device or extra time  Assistive Device None  Distance Ambulated (ft) 2 ft  Activity Response Tolerated well  $Mobility charge 1 Mobility   Pt found in bed w/ no complaints, agreeable to mobilize. Pt walked well w/ OT and requesting to stay in room. Practiced stabilization and balance exercises at bedside. No faults during, returned to bed w/ all needs met awaiting d/c.   Holland Falling Mobility Specialist [place] #:  [phone] Acute Rehab Office:  [phone]
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
## 2022-04-16 ENCOUNTER — Encounter (HOSPITAL_COMMUNITY): Payer: Self-pay | Admitting: Neurological Surgery

## 2022-04-19 ENCOUNTER — Encounter (HOSPITAL_COMMUNITY): Payer: Self-pay

## 2022-04-19 ENCOUNTER — Emergency Department: Payer: Medicaid Other

## 2022-04-19 ENCOUNTER — Emergency Department
Admission: EM | Admit: 2022-04-19 | Discharge: 2022-04-20 | Disposition: A | Discharge status: Home / Self Care | Payer: Medicaid Other | Attending: Emergency Medicine | Admitting: Emergency Medicine

## 2022-04-19 ENCOUNTER — Other Ambulatory Visit: Payer: Self-pay

## 2022-04-19 DIAGNOSIS — S2242XG Multiple fractures of ribs, left side, subsequent encounter for fracture with delayed healing: Secondary | ICD-10-CM | POA: Insufficient documentation

## 2022-04-19 DIAGNOSIS — G8918 Other acute postprocedural pain: Secondary | ICD-10-CM | POA: Diagnosis not present

## 2022-04-19 DIAGNOSIS — Z1152 Encounter for screening for COVID-19: Secondary | ICD-10-CM | POA: Diagnosis not present

## 2022-04-19 DIAGNOSIS — S32019D Unspecified fracture of first lumbar vertebra, subsequent encounter for fracture with routine healing: Secondary | ICD-10-CM

## 2022-04-19 DIAGNOSIS — M545 Low back pain, unspecified: Secondary | ICD-10-CM | POA: Diagnosis present

## 2022-04-19 LAB — COMPREHENSIVE METABOLIC PANEL
ALT: 22 U/L (ref 0–44)
AST: 21 U/L (ref 15–41)
Albumin: 3.8 g/dL (ref 3.5–5.0)
Alkaline Phosphatase: 120 U/L (ref 38–126)
Anion gap: 12 (ref 5–15)
BUN: 14 mg/dL (ref 6–20)
CO2: 26 mmol/L (ref 22–32)
Calcium: 8.9 mg/dL (ref 8.9–10.3)
Chloride: 100 mmol/L (ref 98–111)
Creatinine, Ser: 0.89 mg/dL (ref 0.61–1.24)
GFR, Estimated: >60 mL/min (ref >60)
Glucose, Bld: 111 mg/dL — ABNORMAL HIGH (ref 70–99)
Potassium: 3.8 mmol/L (ref 3.5–5.1)
Sodium: 138 mmol/L (ref 135–145)
Total Bilirubin: 0.7 mg/dL (ref 0.3–1.2)
Total Protein: 7.4 g/dL (ref 6.5–8.1)

## 2022-04-19 LAB — CBC WITH DIFFERENTIAL/PLATELET
Abs Immature Granulocytes: 0.07 10*3/uL (ref 0.00–0.07)
Basophils Absolute: 0.1 10*3/uL (ref 0.0–0.1)
Basophils Relative: 1 %
Eosinophils Absolute: 0.1 10*3/uL (ref 0.0–0.5)
Eosinophils Relative: 1 %
HCT: 41.2 % (ref 39.0–52.0)
Hemoglobin: 13.5 g/dL (ref 13.0–17.0)
Immature Granulocytes: 1 %
Lymphocytes Relative: 34 %
Lymphs Abs: 2.8 10*3/uL (ref 0.7–4.0)
MCH: 30.1 pg (ref 26.0–34.0)
MCHC: 32.8 g/dL (ref 30.0–36.0)
MCV: 92 fL (ref 80.0–100.0)
Monocytes Absolute: 0.7 10*3/uL (ref 0.1–1.0)
Monocytes Relative: 8 %
Neutro Abs: 4.5 10*3/uL (ref 1.7–7.7)
Neutrophils Relative %: 55 %
Platelets: 445 10*3/uL — ABNORMAL HIGH (ref 150–400)
RBC: 4.48 MIL/uL (ref 4.22–5.81)
RDW: 13.2 % (ref 11.5–15.5)
WBC: 8.2 10*3/uL (ref 4.0–10.5)
nRBC: 0 % (ref 0.0–0.2)

## 2022-04-19 LAB — TROPONIN I (HIGH SENSITIVITY): Troponin I (High Sensitivity): 7 ng/L (ref <18)

## 2022-04-19 MED ORDER — OXYCODONE-ACETAMINOPHEN 5-325 MG PO TABS
1.0000 | ORAL_TABLET | Freq: Once | ORAL | Status: AC
  Administered 2022-04-19: 1 via ORAL
  Filled 2022-04-19: qty 1

## 2022-04-19 MED ORDER — OXYCODONE HCL 5 MG PO TABS
5.0000 mg | ORAL_TABLET | ORAL | Status: DC | PRN
  Administered 2022-04-20 (×2): 10 mg via ORAL
  Administered 2022-04-20: 5 mg via ORAL
  Filled 2022-04-19 (×2): qty 2

## 2022-04-19 MED ORDER — ACETAMINOPHEN 500 MG PO TABS
1000.0000 mg | ORAL_TABLET | Freq: Three times a day (TID) | ORAL | Status: DC
  Administered 2022-04-20 (×2): 1000 mg via ORAL
  Filled 2022-04-19 (×2): qty 2

## 2022-04-19 MED ORDER — ACETAMINOPHEN 500 MG PO TABS
1000.0000 mg | ORAL_TABLET | Freq: Once | ORAL | Status: AC
  Administered 2022-04-19: 1000 mg via ORAL
  Filled 2022-04-19: qty 2

## 2022-04-19 MED ORDER — KETOROLAC TROMETHAMINE 30 MG/ML IJ SOLN
15.0000 mg | Freq: Once | INTRAMUSCULAR | Status: AC
  Administered 2022-04-19: 15 mg via INTRAVENOUS
  Filled 2022-04-19: qty 1

## 2022-04-19 MED ORDER — HYDROMORPHONE HCL 1 MG/ML IJ SOLN: 1.0000 mg | Freq: Once | INTRAMUSCULAR | Status: DC

## 2022-04-19 MED ORDER — HYDROMORPHONE HCL 1 MG/ML IJ SOLN
1.0000 mg | Freq: Once | INTRAMUSCULAR | Status: AC
  Administered 2022-04-19: 1 mg via INTRAVENOUS
  Filled 2022-04-19: qty 1

## 2022-04-19 NOTE — ED Notes (Signed)
Called [facility]) for transfer to trauma per Ellender Hose, MD
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
## 2022-04-19 NOTE — ED Triage Notes (Signed)
First nurse note: Patient arrived by EMS from home. C/o back pain from spinal fusion surgery 10 days ago.   EMS reports v/s WNL

## 2022-04-20 LAB — TROPONIN I (HIGH SENSITIVITY): Troponin I (High Sensitivity): 5 ng/L (ref <18)

## 2022-04-20 LAB — RESP PANEL BY RT-PCR (FLU A&B, COVID) ARPGX2
Influenza A by PCR: NEGATIVE
Influenza B by PCR: NEGATIVE
SARS Coronavirus 2 by RT PCR: NEGATIVE

## 2022-04-20 MED ORDER — OXYCODONE HCL 5 MG PO TABS
10.0000 mg | ORAL_TABLET | ORAL | Status: DC | PRN
  Administered 2022-04-20: 10 mg via ORAL
  Filled 2022-04-20: qty 2

## 2022-04-20 MED ORDER — DIPHENHYDRAMINE HCL 25 MG PO CAPS
25.0000 mg | ORAL_CAPSULE | Freq: Four times a day (QID) | ORAL | Status: DC | PRN
  Administered 2022-04-20: 25 mg via ORAL
  Filled 2022-04-20: qty 1

## 2022-04-20 MED ORDER — OXYCODONE HCL 5 MG PO TABS
5.0000 mg | ORAL_TABLET | Freq: Four times a day (QID) | ORAL | Status: DC | PRN
  Filled 2022-04-20: qty 1

## 2022-04-20 MED ORDER — LIDOCAINE 5 % EX PTCH
1.0000 | MEDICATED_PATCH | CUTANEOUS | Status: DC
  Administered 2022-04-20: 1 via TRANSDERMAL
  Filled 2022-04-20: qty 1

## 2022-04-20 MED ORDER — IBUPROFEN 400 MG PO TABS
400.0000 mg | ORAL_TABLET | Freq: Four times a day (QID) | ORAL | Status: DC
  Administered 2022-04-20 (×2): 400 mg via ORAL
  Filled 2022-04-20 (×2): qty 1

## 2022-04-20 MED ORDER — OXYCODONE HCL 5 MG PO TABS: 5.0000 mg | ORAL_TABLET | Freq: Four times a day (QID) | ORAL | 0 refills | Status: DC | PRN
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
## 2022-04-20 NOTE — Progress Notes (Signed)
OT Cancellation Note  Patient Details Name: Guy Rodriguez MRN: [id number] DOB: 03-29-1981   Cancelled Treatment:    Reason Eval/Treat Not Completed: Pain limiting ability to participate. Attempting to see pt for second time today. Pt requesting extra pillows to prop up BLEs. Pt deferring OOB mobility at this time 2/2 low back pain and just getting up to bathroom with PT. Will re-attempt evaluation tomorrow.   Doneta Public 04/20/2022, 2:58 PM

## 2022-04-20 NOTE — ED Notes (Signed)
Pt up restroom, using walker. Gait has improved

## 2022-04-20 NOTE — ED Provider Notes (Signed)
Reassessed patient.  He has been up ambulating with a walker.  Patient is requesting discharge home.  He does not want placement.  He feels safe going home and is just requesting something for pain.  He does appear to be in significant amount of discomfort but pain is currently controlled with p.o. medications.  I reviewed his PMP will order additional pain medication referral to pain management.  Patient will be provided a walker as that does help him with his gait.   Merlyn Lot, MD 04/20/22 [phone]

## 2022-04-20 NOTE — ED Notes (Signed)
Pt up to restroom, using walker. Slow and unsteady gait.

## 2022-04-20 NOTE — Progress Notes (Signed)
OT Cancellation Note  Patient Details Name: Guy Rodriguez MRN: [id number] DOB: 03/19/1981   Cancelled Treatment:    Reason Eval/Treat Not Completed: Pain limiting ability to participate. OT orders received, chart reviewed. Pt reporting 10/10 back pain and declining to work with therapy at this time. Will re-attempt later today.   Doneta Public 04/20/2022, 9:28 AM

## 2022-04-20 NOTE — TOC Initial Note (Signed)
Transition of Care [facility]) - Initial/Assessment Note    Patient Details  Name: Guy Rodriguez MRN: [id number] Date of Birth: 1980-09-07  Transition of [facility]) CM/SW Contact:    [address], [place] Phone Number: 04/20/2022, 1:43 PM  Clinical Narrative:                  CSW acknowledges consult, will continue to follow, waiting on recs from PT.         Patient Goals and CMS Choice        Expected Discharge Plan and Services                                                Prior Living Arrangements/Services                       Activities of Daily Living      Permission Sought/Granted                  Emotional Assessment              Admission diagnosis:  Back Pain Patient Active Problem List   Diagnosis Date Noted   Fracture of lumbar spine without cord injury ([place]) 04/08/2022   Cellulitis 01/17/2019   Leg pain 02/19/2017   Varicose veins of leg with pain 12/26/2016   Chronic venous insufficiency 12/26/2016   Essential hypertension 12/26/2016   GERD (gastroesophageal reflux disease) 12/26/2016   Substance induced mood disorder ([place]) 02/14/2016   Alcohol abuse 02/14/2016   Cocaine abuse ([place]) 02/14/2016   Moderate major depression ([place]) 02/14/2016   PCP:  [facility]:   [facility] [address] [place]) [place] [address] Phone: [phone] Fax: [phone]  [facility] [address] [address] Phone: [phone] Fax: [phone]     Social Determinants of Health (SDOH) Interventions    Readmission Risk Interventions     No data to display

## 2022-05-10 HISTORY — PX: SPINAL FUSION: SHX223

## 2022-06-01 ENCOUNTER — Emergency Department (EMERGENCY_DEPARTMENT_HOSPITAL)
Admission: EM | Admit: 2022-06-01 | Discharge: 2022-06-01 | Disposition: A | Discharge status: Other Acute Inpatient Hospital | Payer: No Typology Code available for payment source | Source: Home / Self Care | Attending: Emergency Medicine | Admitting: Emergency Medicine

## 2022-06-01 ENCOUNTER — Inpatient Hospital Stay
Admission: AD | Admit: 2022-06-01 | Discharge: 2022-06-07 | DRG: 885 | Disposition: A | Discharge status: Home / Self Care | Payer: No Typology Code available for payment source | Source: Intra-hospital | Attending: Psychiatry | Admitting: Psychiatry

## 2022-06-01 ENCOUNTER — Other Ambulatory Visit: Payer: Self-pay

## 2022-06-01 ENCOUNTER — Encounter: Payer: Self-pay | Admitting: Psychiatric/Mental Health

## 2022-06-01 ENCOUNTER — Emergency Department: Payer: No Typology Code available for payment source

## 2022-06-01 DIAGNOSIS — R45851 Suicidal ideations: Secondary | ICD-10-CM | POA: Diagnosis present

## 2022-06-01 DIAGNOSIS — G4700 Insomnia, unspecified: Secondary | ICD-10-CM | POA: Diagnosis present

## 2022-06-01 DIAGNOSIS — F1010 Alcohol abuse, uncomplicated: Secondary | ICD-10-CM | POA: Insufficient documentation

## 2022-06-01 DIAGNOSIS — I10 Essential (primary) hypertension: Secondary | ICD-10-CM | POA: Insufficient documentation

## 2022-06-01 DIAGNOSIS — Z1152 Encounter for screening for COVID-19: Secondary | ICD-10-CM | POA: Insufficient documentation

## 2022-06-01 DIAGNOSIS — F1190 Opioid use, unspecified, uncomplicated: Secondary | ICD-10-CM | POA: Insufficient documentation

## 2022-06-01 DIAGNOSIS — Z20822 Contact with and (suspected) exposure to covid-19: Secondary | ICD-10-CM | POA: Diagnosis present

## 2022-06-01 DIAGNOSIS — F332 Major depressive disorder, recurrent severe without psychotic features: Secondary | ICD-10-CM

## 2022-06-01 DIAGNOSIS — Y902 Blood alcohol level of 40-59 mg/100 ml: Secondary | ICD-10-CM | POA: Insufficient documentation

## 2022-06-01 DIAGNOSIS — Z8614 Personal history of Methicillin resistant Staphylococcus aureus infection: Secondary | ICD-10-CM

## 2022-06-01 DIAGNOSIS — F321 Major depressive disorder, single episode, moderate: Principal | ICD-10-CM | POA: Diagnosis present

## 2022-06-01 DIAGNOSIS — F1490 Cocaine use, unspecified, uncomplicated: Secondary | ICD-10-CM

## 2022-06-01 DIAGNOSIS — R1110 Vomiting, unspecified: Secondary | ICD-10-CM | POA: Insufficient documentation

## 2022-06-01 DIAGNOSIS — F17210 Nicotine dependence, cigarettes, uncomplicated: Secondary | ICD-10-CM | POA: Diagnosis present

## 2022-06-01 DIAGNOSIS — F322 Major depressive disorder, single episode, severe without psychotic features: Secondary | ICD-10-CM | POA: Diagnosis not present

## 2022-06-01 DIAGNOSIS — R1010 Upper abdominal pain, unspecified: Secondary | ICD-10-CM | POA: Diagnosis not present

## 2022-06-01 DIAGNOSIS — F1123 Opioid dependence with withdrawal: Secondary | ICD-10-CM | POA: Diagnosis present

## 2022-06-01 DIAGNOSIS — F329 Major depressive disorder, single episode, unspecified: Secondary | ICD-10-CM | POA: Diagnosis present

## 2022-06-01 LAB — SARS CORONAVIRUS 2 BY RT PCR: SARS Coronavirus 2 by RT PCR: NEGATIVE

## 2022-06-01 LAB — CBC WITH DIFFERENTIAL/PLATELET
Abs Immature Granulocytes: 0.05 10*3/uL (ref 0.00–0.07)
Basophils Absolute: 0.1 10*3/uL (ref 0.0–0.1)
Basophils Relative: 1 %
Eosinophils Absolute: 0 10*3/uL (ref 0.0–0.5)
Eosinophils Relative: 0 %
HCT: 44.2 % (ref 39.0–52.0)
Hemoglobin: 15.2 g/dL (ref 13.0–17.0)
Immature Granulocytes: 1 %
Lymphocytes Relative: 16 %
Lymphs Abs: 1.5 10*3/uL (ref 0.7–4.0)
MCH: 30.6 pg (ref 26.0–34.0)
MCHC: 34.4 g/dL (ref 30.0–36.0)
MCV: 88.9 fL (ref 80.0–100.0)
Monocytes Absolute: 0.7 10*3/uL (ref 0.1–1.0)
Monocytes Relative: 8 %
Neutro Abs: 6.8 10*3/uL (ref 1.7–7.7)
Neutrophils Relative %: 74 %
Platelets: 416 10*3/uL — ABNORMAL HIGH (ref 150–400)
RBC: 4.97 MIL/uL (ref 4.22–5.81)
RDW: 14.5 % (ref 11.5–15.5)
WBC: 9.1 10*3/uL (ref 4.0–10.5)
nRBC: 0 % (ref 0.0–0.2)

## 2022-06-01 LAB — COMPREHENSIVE METABOLIC PANEL
ALT: 22 U/L (ref 0–44)
AST: 27 U/L (ref 15–41)
Albumin: 4.1 g/dL (ref 3.5–5.0)
Alkaline Phosphatase: 104 U/L (ref 38–126)
Anion gap: 16 — ABNORMAL HIGH (ref 5–15)
BUN: 25 mg/dL — ABNORMAL HIGH (ref 6–20)
CO2: 32 mmol/L (ref 22–32)
Calcium: 9.5 mg/dL (ref 8.9–10.3)
Chloride: 86 mmol/L — ABNORMAL LOW (ref 98–111)
Creatinine, Ser: 1.3 mg/dL — ABNORMAL HIGH (ref 0.61–1.24)
GFR, Estimated: >60 mL/min (ref >60)
Glucose, Bld: 127 mg/dL — ABNORMAL HIGH (ref 70–99)
Potassium: 3.3 mmol/L — ABNORMAL LOW (ref 3.5–5.1)
Sodium: 134 mmol/L — ABNORMAL LOW (ref 135–145)
Total Bilirubin: 0.9 mg/dL (ref 0.3–1.2)
Total Protein: 7.9 g/dL (ref 6.5–8.1)

## 2022-06-01 LAB — URINE DRUG SCREEN, QUALITATIVE (ARMC ONLY)
Amphetamines, Ur Screen: NOT DETECTED
Barbiturates, Ur Screen: NOT DETECTED
Benzodiazepine, Ur Scrn: NOT DETECTED
Cannabinoid 50 Ng, Ur ~~LOC~~: POSITIVE — AB
Cocaine Metabolite,Ur ~~LOC~~: POSITIVE — AB
MDMA (Ecstasy)Ur Screen: NOT DETECTED
Methadone Scn, Ur: NOT DETECTED
Opiate, Ur Screen: POSITIVE — AB
Phencyclidine (PCP) Ur S: NOT DETECTED
Tricyclic, Ur Screen: NOT DETECTED

## 2022-06-01 LAB — URINALYSIS, ROUTINE W REFLEX MICROSCOPIC
Bacteria, UA: NONE SEEN
Bilirubin Urine: NEGATIVE
Glucose, UA: NEGATIVE mg/dL
Hgb urine dipstick: NEGATIVE
Ketones, ur: 20 mg/dL — AB
Leukocytes,Ua: NEGATIVE
Nitrite: NEGATIVE
Protein, ur: 30 mg/dL — AB
Specific Gravity, Urine: >1.046 — ABNORMAL HIGH (ref 1.005–1.030)
Squamous Epithelial / HPF: NONE SEEN (ref 0–5)
pH: 7 (ref 5.0–8.0)

## 2022-06-01 LAB — LIPASE, BLOOD: Lipase: 28 U/L (ref 11–51)

## 2022-06-01 LAB — SALICYLATE LEVEL: Salicylate Lvl: <7 mg/dL — ABNORMAL LOW (ref 7.0–30.0)

## 2022-06-01 LAB — ETHANOL: Alcohol, Ethyl (B): 49 mg/dL — ABNORMAL HIGH (ref <10)

## 2022-06-01 LAB — ACETAMINOPHEN LEVEL: Acetaminophen (Tylenol), Serum: <10 ug/mL — ABNORMAL LOW (ref 10–30)

## 2022-06-01 MED ORDER — NICOTINE POLACRILEX 2 MG MT GUM
2.0000 mg | CHEWING_GUM | OROMUCOSAL | Status: DC | PRN
  Administered 2022-06-01 – 2022-06-06 (×12): 2 mg via ORAL
  Filled 2022-06-01 (×13): qty 1

## 2022-06-01 MED ORDER — ACETAMINOPHEN 325 MG PO TABS
650.0000 mg | ORAL_TABLET | Freq: Four times a day (QID) | ORAL | Status: DC | PRN
  Administered 2022-06-01 – 2022-06-07 (×7): 650 mg via ORAL
  Filled 2022-06-01 (×8): qty 2

## 2022-06-01 MED ORDER — ONDANSETRON HCL 4 MG/2ML IJ SOLN
4.0000 mg | Freq: Once | INTRAMUSCULAR | Status: AC
  Administered 2022-06-01: 4 mg via INTRAVENOUS
  Filled 2022-06-01: qty 2

## 2022-06-01 MED ORDER — TRAZODONE HCL 50 MG PO TABS
50.0000 mg | ORAL_TABLET | Freq: Every evening | ORAL | Status: DC | PRN
  Administered 2022-06-01 – 2022-06-06 (×5): 50 mg via ORAL
  Filled 2022-06-01 (×5): qty 1

## 2022-06-01 MED ORDER — MAGNESIUM HYDROXIDE 400 MG/5ML PO SUSP
30.0000 mL | Freq: Every day | ORAL | Status: DC | PRN
  Administered 2022-06-05: 30 mL via ORAL
  Filled 2022-06-01 (×2): qty 30

## 2022-06-01 MED ORDER — MORPHINE SULFATE (PF) 4 MG/ML IV SOLN
4.0000 mg | Freq: Once | INTRAVENOUS | Status: AC
  Administered 2022-06-01: 4 mg via INTRAVENOUS
  Filled 2022-06-01: qty 1

## 2022-06-01 MED ORDER — PANTOPRAZOLE SODIUM 40 MG IV SOLR
40.0000 mg | Freq: Once | INTRAVENOUS | Status: AC
  Administered 2022-06-01: 40 mg via INTRAVENOUS
  Filled 2022-06-01: qty 10

## 2022-06-01 MED ORDER — SODIUM CHLORIDE 0.9 % IV BOLUS (SEPSIS)
1000.0000 mL | Freq: Once | INTRAVENOUS | Status: AC
  Administered 2022-06-01: 1000 mL via INTRAVENOUS

## 2022-06-01 MED ORDER — ALUM & MAG HYDROXIDE-SIMETH 200-200-20 MG/5ML PO SUSP: 30.0000 mL | ORAL | Status: DC | PRN

## 2022-06-01 MED ORDER — HYDROXYZINE HCL 25 MG PO TABS
25.0000 mg | ORAL_TABLET | Freq: Three times a day (TID) | ORAL | Status: DC | PRN
  Administered 2022-06-01 – 2022-06-07 (×6): 25 mg via ORAL
  Filled 2022-06-01 (×7): qty 1

## 2022-06-01 MED ORDER — IOHEXOL 350 MG/ML SOLN
75.0000 mL | Freq: Once | INTRAVENOUS | Status: AC | PRN
  Administered 2022-06-01: 75 mL via INTRAVENOUS

## 2022-06-01 NOTE — BH Assessment (Signed)
Comprehensive Clinical Assessment (CCA) Note  06/01/2022 Guy Rodriguez [id number]  Chief Complaint:  Chief Complaint  Patient presents with   Abdominal Pain   Emesis   Suicidal   Visit Diagnosis: Major Depressive Disorder, recurrent, severe, without psychotic features    CCA Screening, Triage and Referral (STR)  Patient Reported Information How did you hear about [place]? Self  Referral name: No data recorded Referral phone number: No data recorded  Whom do you see for routine medical problems? No data recorded Practice/Facility Name: No data recorded Practice/Facility Phone Number: No data recorded Name of Contact: No data recorded Contact Number: No data recorded Contact Fax Number: No data recorded Prescriber Name: No data recorded Prescriber Address (if known): No data recorded  What Is the Reason for Your Visit/Call Today? Patient reports feeling depressed with passive thoughts of suicide  How Long Has This Been Causing You Problems? > than 6 months  What Do You Feel Would Help You the Most Today? Stress Management; Treatment for Depression or other mood problem; Alcohol or Drug Use Treatment; Housing Assistance; Food Assistance; Financial Resources   Have You Recently Been in Any Inpatient Treatment (Hospital/Detox/Crisis Center/28-Day Program)? No data recorded Name/Location of Program/Hospital:No data recorded How Long Were You There? No data recorded When Were You Discharged? No data recorded  Have You Ever Received Services From [facility] Before? No data recorded Who Do You See at [facility]? No data recorded  Have You Recently Had Any Thoughts About Hurting Yourself? No  Are You Planning to Commit Suicide/Harm Yourself At This time? No   Have you Recently Had Thoughts About Hurting Someone [name]? No  Explanation: No data recorded  Have You Used Any Alcohol or Drugs in the Past 24 Hours? Yes  How Long Ago Did You Use Drugs or Alcohol? No data  recorded What Did You Use and How Much? UDS + Cocaine, Opiates, and Alcohol   Do You Currently Have a Therapist/Psychiatrist? No  Name of Therapist/Psychiatrist: No data recorded  Have You Been Recently Discharged From Any Office Practice or Programs? No  Explanation of Discharge From Practice/Program: No data recorded    CCA Screening Triage Referral Assessment Type of Contact: Face-to-Face  Is this Initial or Reassessment? No data recorded Date Telepsych consult ordered in [facility]:  No data recorded Time Telepsych consult ordered in [facility]:  No data recorded  Patient Reported Information Reviewed? No data recorded Patient Left Without Being Seen? No data recorded Reason for Not Completing Assessment: No data recorded  Collateral Involvement: None provided   Does Patient Have a Court Appointed Legal Guardian? No data recorded Name and Contact of Legal Guardian: No data recorded If Minor and Not Living with Parent(s), Who has Custody? n/a  Is CPS involved or ever been involved? Never  Is APS involved or ever been involved? Never   Patient Determined To Be At Risk for Harm To Self or Others Based on Review of Patient Reported Information or Presenting Complaint? No  Method: No data recorded Availability of Means: No data recorded Intent: No data recorded Notification Required: No data recorded Additional Information for Danger to Others Potential: No data recorded Additional Comments for Danger to Others Potential: No data recorded Are There Guns or Other Weapons in Your Home? No  Types of Guns/Weapons: No data recorded Are These Weapons Safely Secured?  No data recorded Who Could Verify You Are Able To Have These Secured: No data recorded Do You Have any Outstanding Charges, Pending Court Dates, Parole/Probation? No data recorded Contacted To Inform of Risk of Harm To Self or Others: No data recorded  Location of Assessment: [facility] ED   Does  Patient Present under Involuntary Commitment? No  IVC Papers Initial File Date: 02/20/22   [place] of Residence: [place]   Patient Currently Receiving the Following Services: Not Receiving Services   Determination of Need: Emergent (2 hours)   Options For Referral: ED Visit; Intensive Outpatient Therapy; Medication Management; Chemical Dependency Intensive Outpatient Therapy (CDIOP); Inpatient Hospitalization     CCA Biopsychosocial Intake/Chief Complaint:  No data recorded Current Symptoms/Problems: No data recorded  Patient Reported Schizophrenia/Schizoaffective Diagnosis in Past: No   Strengths: Patient is able to communicate his needs  Preferences: No data recorded Abilities: No data recorded  Type of Services Patient Feels are Needed: No data recorded  Initial Clinical Notes/Concerns: No data recorded  Mental Health Symptoms Depression:   Change in energy/activity; Hopelessness   Duration of Depressive symptoms:  Greater than two weeks   Mania:   None   Anxiety:    Difficulty concentrating; Restlessness   Psychosis:   None   Duration of Psychotic symptoms: No data recorded  Trauma:   None   Obsessions:   None   Compulsions:   None   Inattention:   None   Hyperactivity/Impulsivity:   None   Oppositional/Defiant Behaviors:   None   Emotional Irregularity:   None   Other Mood/Personality Symptoms:  No data recorded   Mental Status Exam Appearance and self-care  Stature:   Average   Weight:   Average weight   Clothing:   Disheveled   Grooming:   Neglected   Cosmetic use:   None   Posture/gait:   Normal   Motor activity:   Not Remarkable   Sensorium  Attention:   Normal   Concentration:   Normal   Orientation:   X5   Recall/memory:   Normal   Affect and Mood  Affect:   Tearful   Mood:   Depressed   Relating  Eye contact:   Normal   Facial expression:   Depressed; Sad   Attitude toward  examiner:   Cooperative   Thought and Language  Speech flow:  Clear and Coherent   Thought content:   Appropriate to Mood and Circumstances   Preoccupation:   None   Hallucinations:   None   Organization:  No data recorded  [employer] of Knowledge:   Fair   Intelligence:   Average   Abstraction:   Functional   Judgement:   Impaired   Reality Testing:   Distorted   Insight:   Lacking   Decision Making:   Impulsive   Social Functioning  Social Maturity:   Irresponsible   Social Judgement:   Heedless   Stress  Stressors:   Family conflict   Coping Ability:   Contractor Deficits:   None   Supports:   Family     Religion:    Leisure/Recreation: Leisure / Recreation Do You Have Hobbies?: No  Exercise/Diet: Exercise/Diet Do You Exercise?: No Have You Gained or Lost A Significant Amount of Weight in the Past Six Months?: No Do You Follow a Special Diet?: No Do You Have Any Trouble Sleeping?: Yes   CCA Employment/Education Employment/Work Situation: Employment / Work Situation Employment Situation:  Unemployed Patient's Job has Been Impacted by Current Illness: No Has Patient ever Been in the [employer]?: No  Education: Education Is Patient Currently Attending School?: No Did You Product manager?: No Did You Have An Individualized Education Program (IIEP): No Did You Have Any Difficulty At School?: No Patient's Education Has Been Impacted by Current Illness: No   CCA Family/Childhood History Family and Relationship History: Family history Marital status: Single Does patient have children?: No  Childhood History:  Childhood History Did patient suffer any verbal/emotional/physical/sexual abuse as a child?: No Has patient ever been sexually abused/assaulted/raped as an adolescent or adult?: No Witnessed domestic violence?: No Has patient been affected by domestic violence as an adult?: No  Child/Adolescent  Assessment: N/A     CCA Substance Use Alcohol/Drug Use: Alcohol / Drug Use Pain Medications: See MAR Prescriptions: See MAR Over the Counter: See MAR History of alcohol / drug use?: Yes Longest period of sobriety (when/how long): UKN Negative Consequences of Use: Personal relationships, Work / Programmer, multimedia, Armed forces operational officer Withdrawal Symptoms: Diarrhea, Sweats, Fever / Chills, Tingling, Tremors, Irritability, Nausea / Vomiting, Weakness, Patient aware of relationship between substance abuse and physical/medical complications   Substance #2 Name of Substance 2: Alcohol 2 - Age of First Use: UKN 2 - Amount (size/oz): 4-5 drinks 2 - Frequency: Daily 2 - Duration: yaers 2 - Last Use / Amount: "Last night at 9:30pm - 4-5 [employer] [place]" 2 - Method of Aquiring: Money from friends 2 - Route of Substance Use: Orally    ASAM's:  Six Dimensions of Multidimensional Assessment  Dimension 1:  Acute Intoxication and/or Withdrawal Potential:      Dimension 2:  Biomedical Conditions and Complications:      Dimension 3:  Emotional, Behavioral, or Cognitive Conditions and Complications:     Dimension 4:  Readiness to Change:     Dimension 5:  Relapse, Continued use, or Continued Problem Potential:     Dimension 6:  Recovery/Living Environment:     ASAM Severity Score:    ASAM Recommended Level of Treatment: ASAM Recommended Level of Treatment: Level II Intensive Outpatient Treatment   Substance use Disorder (SUD) Substance Use Disorder (SUD)  Checklist Symptoms of Substance Use: Continued use despite having a persistent/recurrent physical/psychological problem caused/exacerbated by use, Persistent desire or unsuccessful efforts to cut down or control use, Presence of craving or strong urge to use  Recommendations for Services/Supports/Treatments: Recommendations for Services/Supports/Treatments Recommendations For Services/Supports/Treatments: Inpatient Hospitalization  DSM5 Diagnoses: Patient Active  Problem List   Diagnosis Date Noted   Suicidal ideation 06/01/2022   Cocaine use 06/01/2022   Upper abdominal pain 06/01/2022   Severe episode of recurrent major depressive disorder, without psychotic features (HCC) 06/01/2022   Fracture of lumbar spine without cord injury (HCC) 04/08/2022   Cellulitis 01/17/2019   Leg pain 02/19/2017   Varicose veins of leg with pain 12/26/2016   Chronic venous insufficiency 12/26/2016   Essential hypertension 12/26/2016   GERD (gastroesophageal reflux disease) 12/26/2016   Substance induced mood disorder (HCC) 02/14/2016   ETOH abuse 02/14/2016   Cocaine abuse (HCC) 02/14/2016   Moderate major depression (HCC) 02/14/2016   Greely Atiyeh Geralyn Flash, Counselor

## 2022-06-01 NOTE — Tx Team (Signed)
Initial Treatment Plan 06/01/2022 3:00 PM Seneca Hoback [id number]    PATIENT STRESSORS: Financial difficulties   Marital or family conflict   Substance abuse     PATIENT STRENGTHS: Motivation for treatment/growth    PATIENT IDENTIFIED PROBLEMS: Alcohol and drug abuse  Marital Conflict  Financial difficulty/homeless                 DISCHARGE CRITERIA:  Ability to meet basic life and health needs Adequate post-discharge living arrangements Withdrawal symptoms are absent or subacute and managed without 24-hour nursing intervention  PRELIMINARY DISCHARGE PLAN: Outpatient therapy Placement in alternative living arrangements  PATIENT/FAMILY INVOLVEMENT: This treatment plan has been presented to and reviewed with the patient, Guy Rodriguez.  The patient has been given the opportunity to ask questions and make suggestions.  Roseanne Reno, RN 06/01/2022, 3:00 PM

## 2022-06-01 NOTE — ED Triage Notes (Addendum)
[facility] [place] [facility] c/o abd pain, N/V. Pt states pain and N/V started around 5 days ago. Pt states he has not been eating but has been drinking alcohol. Denies CP, SOB. Pt also stating he's having SI, wants to see a psychiatrist.

## 2022-06-01 NOTE — ED Provider Notes (Signed)
[facility] Provider Note    Event Date/Time   First MD Initiated Contact with Patient 06/01/22 [phone]     (approximate)   History   Abdominal Pain, Emesis, and Suicidal   HPI  Guy Rodriguez is a 41 y.o. male with history of alcohol and cocaine abuse, GERD who presents to the emergency department with complaints of suicidal thoughts.  No HI or hallucinations.  Has been drinking alcohol and using crack cocaine today.  Also reports upper abdominal pain.  No chest pain, shortness of breath, vomiting, diarrhea, fevers.  No previous abdominal surgery.   History provided by patient.    Past Medical History:  Diagnosis Date   Cocaine abuse (HCC)    ETOH abuse    GERD (gastroesophageal reflux disease)    History of methicillin resistant staphylococcus aureus (MRSA)    History of participation in smoking cessation counseling    Hypertension    H/O IN 2016-PCP TOOK PT OFF BP MED DUE TO BP CONTROLLED    Past Surgical History:  Procedure Laterality Date   APPENDECTOMY     BAND HEMORRHOIDECTOMY     CARPAL TUNNEL RELEASE Right    clavicale surgery Left    VASECTOMY N/A 02/28/2017   Procedure: VASECTOMY;  Surgeon: Hildred Laser, MD;  Location: [facility] ORS;  Service: Urology;  Laterality: N/A;   VASECTOMY Bilateral 03/28/2017   Procedure: VASECTOMY;  Surgeon: Hildred Laser, MD;  Location: [facility] ORS;  Service: Urology;  Laterality: Bilateral;    MEDICATIONS:  Prior to Admission medications   Medication Sig Start Date End Date Taking? Authorizing Provider  oxyCODONE (OXY IR/ROXICODONE) 5 MG immediate release tablet Take 1 tablet (5 mg total) by mouth every 6 (six) hours as needed (pain). 04/20/22   Willy Eddy, MD  pregabalin (LYRICA) 75 MG capsule Take 1 capsule (75 mg total) by mouth daily. Patient not taking: Reported on 02/14/2020 01/12/19 05/22/20  Georgiana Spinner, NP  ranitidine (ZANTAC) 150 MG tablet Take 150 mg by mouth 2 (two) times daily.   Patient not taking: Reported on 02/14/2020  05/22/20  [provider]    Physical Exam   Triage Vital Signs: ED Triage Vitals  Enc Vitals Group     BP 06/01/22 0039 (!) 120/93     Pulse Rate 06/01/22 0039 94     Resp 06/01/22 0039 20     Temp 06/01/22 0039 98.4 F (36.9 C)     Temp Source 06/01/22 0039 Oral     SpO2 06/01/22 0039 99 %     Weight 06/01/22 0040 175 lb (79.4 kg)     Height 06/01/22 0040 6\' 1"  (1.854 m)     Head Circumference --      Peak Flow --      Pain Score 06/01/22 0054 10     Pain Loc --      Pain Edu? --      Excl. in GC? --     Most recent vital signs: Vitals:   06/01/22 0043 06/01/22 0253  BP:  123/72  Pulse:  90  Resp:  16  Temp:  98.5 F (36.9 C)  SpO2: 96% 98%    CONSTITUTIONAL: Alert and oriented and responds appropriately to questions.  Chronically ill-appearing, appears older than stated age HEAD: Normocephalic, atraumatic EYES: Conjunctivae clear, pupils appear equal, sclera nonicteric ENT: normal nose; moist mucous membranes NECK: Supple, normal ROM CARD: RRR; S1 and S2 appreciated; no murmurs, no clicks, no rubs, no  gallops RESP: Normal chest excursion without splinting or tachypnea; breath sounds clear and equal bilaterally; no wheezes, no rhonchi, no rales, no hypoxia or respiratory distress, speaking full sentences ABD/GI: Normal bowel sounds; non-distended; soft, tender to palpation throughout the upper abdomen without guarding or rebound BACK: The back appears normal EXT: Normal ROM in all joints; no deformity noted, no edema; no cyanosis SKIN: Normal color for age and race; warm; no rash on exposed skin NEURO: Moves all extremities equally, normal speech PSYCH: States he is suicidal.  No HI or hallucinations.   ED Results / Procedures / Treatments   LABS: (all labs ordered are listed, but only abnormal results are displayed) Labs Reviewed  COMPREHENSIVE METABOLIC PANEL - Abnormal; Notable for the following  components:      Result Value   Sodium 134 (*)    Potassium 3.3 (*)    Chloride 86 (*)    Glucose, Bld 127 (*)    BUN 25 (*)    Creatinine, Ser 1.30 (*)    Anion gap 16 (*)    All other components within normal limits  CBC WITH DIFFERENTIAL/PLATELET - Abnormal; Notable for the following components:   Platelets 416 (*)    All other components within normal limits  ETHANOL - Abnormal; Notable for the following components:   Alcohol, Ethyl (B) 49 (*)    All other components within normal limits  SALICYLATE LEVEL - Abnormal; Notable for the following components:   Salicylate Lvl <7.0 (*)    All other components within normal limits  ACETAMINOPHEN LEVEL - Abnormal; Notable for the following components:   Acetaminophen (Tylenol), Serum <10 (*)    All other components within normal limits  LIPASE, BLOOD  URINALYSIS, ROUTINE W REFLEX MICROSCOPIC  URINE DRUG SCREEN, QUALITATIVE ([facility] ONLY)     EKG:  RADIOLOGY: My personal review and interpretation of imaging: CT scan shows no acute abnormality.  I have personally reviewed all radiology reports.   CT ABDOMEN PELVIS W CONTRAST  Result Date: 06/01/2022 CLINICAL DATA:  Abdominal pain. EXAM: CT ABDOMEN AND PELVIS WITH CONTRAST TECHNIQUE: Multidetector CT imaging of the abdomen and pelvis was performed using the standard protocol following bolus administration of intravenous contrast. RADIATION DOSE REDUCTION: This exam was performed according to the departmental dose-optimization program which includes automated exposure control, adjustment of the mA and/or kV according to patient size and/or use of iterative reconstruction technique. CONTRAST:  87mL OMNIPAQUE IOHEXOL 350 MG/ML SOLN COMPARISON:  April 08, 2022 FINDINGS: Lower chest: No acute abnormality. Hepatobiliary: No focal liver abnormality is seen. No gallstones, gallbladder wall thickening, or biliary dilatation. Pancreas: Unremarkable. No pancreatic ductal dilatation or surrounding  inflammatory changes. Spleen: Normal in size without focal abnormality. Adrenals/Urinary Tract: Adrenal glands are unremarkable. Kidneys are normal, without renal calculi, focal lesion, or hydronephrosis. Bladder is unremarkable. Stomach/Bowel: Stomach is within normal limits. Appendix appears normal. No evidence of bowel wall thickening, distention, or inflammatory changes. Vascular/Lymphatic: Aortic atherosclerosis. No enlarged abdominal or pelvic lymph nodes. Reproductive: Prostate is unremarkable. Other: No abdominal wall hernia or abnormality. No abdominopelvic ascites. Musculoskeletal: A Chance fracture is again seen at the level of L1. A chronic posterior tenth left rib fracture is noted. Postoperative changes are seen within the lower thoracic the spine and upper lumbar spine. This represents a new finding when compared to the prior study. IMPRESSION: 1. No acute or active process within the abdomen or pelvis. 2. Chance fracture at the level of L1. 3. Postoperative changes within the lower thoracic  the spine and upper lumbar spine. 4. Chronic posterior tenth left rib fracture. 5. Aortic atherosclerosis. Aortic Atherosclerosis (ICD10-I70.0). Electronically Signed   By: Aram Candela M.D.   On: 06/01/2022 02:40     PROCEDURES:  Critical Care performed: No      Procedures    IMPRESSION / MDM / ASSESSMENT AND PLAN / ED COURSE  I reviewed the triage vital signs and the nursing notes.    Patient here with suicidal thoughts.  Also having upper abdominal pain.     DIFFERENTIAL DIAGNOSIS (includes but not limited to):   Depression, anxiety, intoxication, GERD, cholelithiasis, Coley cystitis, pancreatitis, colitis, doubt ACS, PE, dissection   Patient's presentation is most consistent with acute presentation with potential threat to life or bodily function.   PLAN: Will obtain CBC, CMP, lipase, urinalysis, urine drug screen, ethanol level, Tylenol and salicylate levels.  Will give IV  fluids, pain and nausea medicine, Protonix.   MEDICATIONS GIVEN IN ED: Medications  sodium chloride 0.9 % bolus 1,000 mL (0 mLs Intravenous Stopped 06/01/22 0308)  morphine (PF) 4 MG/ML injection 4 mg (4 mg Intravenous Given 06/01/22 0215)  ondansetron (ZOFRAN) injection 4 mg (4 mg Intravenous Given 06/01/22 0215)  iohexol (OMNIPAQUE) 350 MG/ML injection 75 mL (75 mLs Intravenous Contrast Given 06/01/22 0233)  pantoprazole (PROTONIX) injection 40 mg (40 mg Intravenous Given 06/01/22 0250)     ED COURSE: Patient feeling better and asking for something to eat.  Labs show no leukocytosis.  Normal LFTs and lipase.  Ethanol level slightly elevated in the 40s.  Tylenol and salicylate negative.  CT scan reviewed and interpreted by myself and the radiologist and shows no acute abnormality.  He has old fractures in his lumbar spine seen on previous imaging in October.  No new injury.  Medically cleared at this time.  Will p.o. challenge.  Will consult psychiatry and TTS.  He is here voluntarily at this time.   CONSULTS: TTS and psychiatry consulted for further disposition.   OUTSIDE RECORDS REVIEWED: Reviewed patient's last office visit with family medicine on 04/26/2022.       FINAL CLINICAL IMPRESSION(S) / ED DIAGNOSES   Final diagnoses:  Suicidal ideation  ETOH abuse  Cocaine use  Upper abdominal pain     Rx / DC Orders   ED Discharge Orders     None        Note:  This document was prepared using Dragon voice recognition software and may include unintentional dictation errors.   Nilaya Bouie, Layla Maw, DO 06/01/22 [phone]

## 2022-06-01 NOTE — ED Notes (Signed)
Psych NP at bedside

## 2022-06-01 NOTE — ED Notes (Signed)
Pt states that he has been [place] with [place] and that she left today.  States SI and planned to take all his suboxone today to end his life.  Pt states that he is till having SI thoughts. Pt tearful upon assessment.  Suboxone bottle with 16 packets inside found in pt belongings.  Pt states that he had crack yesterday and takes regularly as well as drinks alcohol daily. Pt states that he would ike help with his addiction.

## 2022-06-01 NOTE — ED Notes (Signed)
This pt has medication stored in pharmacy. Please be sure to retrieve prior to D/C.

## 2022-06-01 NOTE — ED Notes (Signed)
Patient transported to CT 

## 2022-06-01 NOTE — ED Notes (Signed)
Pt belonging bag 1 of 1 placed in locked storage in BHU in bin appropriately labeled with Rm 20H by NT Zachery Dakins.

## 2022-06-01 NOTE — ED Notes (Signed)
Pt given sandwich tray and milk per his request.  Per EDP Ward, pt can eat and drink.

## 2022-06-01 NOTE — BH Assessment (Addendum)
Patient is to be admitted to [facility] by Psychiatric Nurse Practitioner Nanine Means.  Attending Physician will be Dr.  Toni Amend .   Patient has been assigned to room 309, by [facility] Charge Nurse Dairl Ponder.   Intake Paper Work has been signed and placed on patient chart.   ER staff is aware of the admission: Ronnie, ER Secretary   Dr. Modesto Charon, ER MD  Florentina Addison, Patient's Nurse  Purnell Shoemaker, Patient Access.

## 2022-06-01 NOTE — ED Notes (Addendum)
Pt dressed out into burgundy scrubs with this RN and [place], NT. Belongings placed in belongings bag  Red shirt Black sweatpants Orange jacket Black back brace Pair of white socks Black shoes Lighter Purple vape

## 2022-06-01 NOTE — ED Notes (Addendum)
Rainbow sent to the lab at this time.  
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
## 2022-06-01 NOTE — ED Triage Notes (Signed)
First Nurse Note: Pt from gas station BIB [facility] c/o abd pain has not ate in 3-4 days, but has been drinking alcohol. AXO x4, GCS 15. Hx of ETOH and cocaine abuse.

## 2022-06-02 ENCOUNTER — Encounter: Payer: Self-pay | Admitting: Psychiatric/Mental Health

## 2022-06-02 DIAGNOSIS — F322 Major depressive disorder, single episode, severe without psychotic features: Secondary | ICD-10-CM

## 2022-06-02 LAB — BASIC METABOLIC PANEL
Anion gap: 7 (ref 5–15)
BUN: 20 mg/dL (ref 6–20)
CO2: 31 mmol/L (ref 22–32)
Calcium: 8.5 mg/dL — ABNORMAL LOW (ref 8.9–10.3)
Chloride: 99 mmol/L (ref 98–111)
Creatinine, Ser: 1.02 mg/dL (ref 0.61–1.24)
GFR, Estimated: >60 mL/min (ref >60)
Glucose, Bld: 101 mg/dL — ABNORMAL HIGH (ref 70–99)
Potassium: 4.6 mmol/L (ref 3.5–5.1)
Sodium: 137 mmol/L (ref 135–145)

## 2022-06-02 LAB — TSH: TSH: 1.896 u[IU]/mL (ref 0.350–4.500)

## 2022-06-02 MED ORDER — LORAZEPAM 2 MG/ML IJ SOLN: 1.0000 mg | INTRAMUSCULAR | Status: AC | PRN

## 2022-06-02 MED ORDER — LORAZEPAM 2 MG PO TABS
0.0000 mg | ORAL_TABLET | ORAL | Status: AC
  Administered 2022-06-02: 3 mg via ORAL
  Administered 2022-06-02 – 2022-06-03 (×6): 2 mg via ORAL
  Administered 2022-06-04: 1 mg via ORAL
  Filled 2022-06-02 (×6): qty 1
  Filled 2022-06-02: qty 2
  Filled 2022-06-02: qty 1

## 2022-06-02 MED ORDER — SERTRALINE HCL 25 MG PO TABS
50.0000 mg | ORAL_TABLET | Freq: Every day | ORAL | Status: DC
  Administered 2022-06-02 – 2022-06-07 (×6): 50 mg via ORAL
  Filled 2022-06-02 (×6): qty 2

## 2022-06-02 MED ORDER — THIAMINE MONONITRATE 100 MG PO TABS
100.0000 mg | ORAL_TABLET | Freq: Every day | ORAL | Status: DC
  Administered 2022-06-02 – 2022-06-07 (×6): 100 mg via ORAL
  Filled 2022-06-02 (×6): qty 1

## 2022-06-02 MED ORDER — ADULT MULTIVITAMIN W/MINERALS CH
1.0000 | ORAL_TABLET | Freq: Every day | ORAL | Status: DC
  Administered 2022-06-02 – 2022-06-07 (×6): 1 via ORAL
  Filled 2022-06-02 (×6): qty 1

## 2022-06-02 MED ORDER — LORAZEPAM 2 MG PO TABS
0.0000 mg | ORAL_TABLET | Freq: Three times a day (TID) | ORAL | Status: AC
  Administered 2022-06-04 – 2022-06-05 (×4): 1 mg via ORAL
  Filled 2022-06-02 (×4): qty 1

## 2022-06-02 MED ORDER — LORAZEPAM 1 MG PO TABS: 1.0000 mg | ORAL_TABLET | ORAL | Status: AC | PRN

## 2022-06-02 MED ORDER — FOLIC ACID 1 MG PO TABS
1.0000 mg | ORAL_TABLET | Freq: Every day | ORAL | Status: DC
  Administered 2022-06-02 – 2022-06-07 (×6): 1 mg via ORAL
  Filled 2022-06-02 (×6): qty 1

## 2022-06-02 MED ORDER — THIAMINE HCL 100 MG/ML IJ SOLN
100.0000 mg | Freq: Every day | INTRAMUSCULAR | Status: DC
  Filled 2022-06-02 (×6): qty 1

## 2022-06-02 NOTE — [facility] Suicide Risk Assessment (Signed)
[facility] INPATIENT:  Family/Significant Other Suicide Prevention Education  Suicide Prevention Education:  Patient Refusal for Family/Significant Other Suicide Prevention Education: The patient Guy Rodriguez has refused to provide written consent for family/significant other to be provided Family/Significant Other Suicide Prevention Education during admission and/or prior to discharge.  Physician notified.  Corky Crafts 06/02/2022, 3:31 PM
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
## 2022-06-02 NOTE — Plan of Care (Signed)
D: Patient alert and oriented. Patient denies pain. Patient endorsed anxiety and depression. Patient denies SI/HI/AVH. Patient isolative to room with exception to coming out for meals and medication.   A: Scheduled medications administered to patient, per MD orders.  Support and encouragement provided to patient.  Q15 minute safety checks maintained.   R: Patient compliant with medication administration and treatment plan. No adverse drug reactions noted. Patient remains safe on the unit at this time. Problem: Education: Goal: Knowledge of [facility] Education information/materials will improve Outcome: Progressing Goal: Verbalization of understanding the information provided will improve Outcome: Progressing   Problem: Health Behavior/Discharge Planning: Goal: Compliance with treatment plan for underlying cause of condition will improve Outcome: Progressing   Problem: Physical Regulation: Goal: Ability to maintain clinical measurements within normal limits will improve Outcome: Progressing   Problem: Safety: Goal: Periods of time without injury will increase Outcome: Progressing
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
## 2022-06-02 NOTE — Group Note (Signed)
[facility] LCSW Group Therapy Note   Group Date: 06/02/2022 Start Time: 1300 End Time: 1400   Type of Therapy and Topic: Group Therapy: Avoiding Self-Sabotaging and Enabling Behaviors  Participation Level: Did Not Attend  Mood:  Description of Group:  In this group, patients will learn how to identify obstacles, self-sabotaging and enabling behaviors, as well as: what are they, why do we do them and what needs these behaviors meet. Discuss unhealthy relationships and how to have positive healthy boundaries with those that sabotage and enable. Explore aspects of self-sabotage and enabling in yourself and how to limit these self-destructive behaviors in everyday life.   Therapeutic Goals: 1. Patient will identify one obstacle that relates to self-sabotage and enabling behaviors 2. Patient will identify one personal self-sabotaging or enabling behavior they did prior to admission 3. Patient will state a plan to change the above identified behavior 4. Patient will demonstrate ability to communicate their needs through discussion and/or role play.    Summary of Patient Progress:   Patient did not attend group despite encouraged participation.    Therapeutic Modalities:  Cognitive Behavioral Therapy Person-Centered Therapy Motivational Interviewing    Haidyn Kilburg W Danyah Guastella, LCSWA 
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
## 2022-06-03 DIAGNOSIS — F1190 Opioid use, unspecified, uncomplicated: Secondary | ICD-10-CM | POA: Insufficient documentation

## 2022-06-03 DIAGNOSIS — F332 Major depressive disorder, recurrent severe without psychotic features: Secondary | ICD-10-CM

## 2022-06-03 LAB — RPR: RPR Ser Ql: NONREACTIVE

## 2022-06-03 MED ORDER — BUPRENORPHINE HCL-NALOXONE HCL 8-2 MG SL SUBL
1.0000 | SUBLINGUAL_TABLET | Freq: Every day | SUBLINGUAL | Status: AC
  Administered 2022-06-03: 1 via SUBLINGUAL
  Filled 2022-06-03: qty 1

## 2022-06-03 MED ORDER — BUPRENORPHINE HCL-NALOXONE HCL 8-2 MG SL SUBL
1.0000 | SUBLINGUAL_TABLET | Freq: Three times a day (TID) | SUBLINGUAL | Status: DC
  Administered 2022-06-03 – 2022-06-07 (×12): 1 via SUBLINGUAL
  Filled 2022-06-03 (×12): qty 1

## 2022-06-03 NOTE — [facility] Group Notes (Signed)
[facility] Group Notes:  (Nursing/MHT/Case Management/Adjunct)  Date:  06/03/2022  Time:  8:50 PM  Type of Therapy:   Wrap up  Participation Level:  Did Not Attend   Mayra Neer 06/03/2022, 8:50 PM

## 2022-06-03 NOTE — Progress Notes (Signed)
Patient asleep.

## 2022-06-03 NOTE — Progress Notes (Signed)
Recreation Therapy Notes     Date: 06/03/2022   Time: 10:45 am      Location: Craft room    Behavioral response: N/A   Intervention Topic: Time Management   Discussion/Intervention: Patient refused to attend group.    Clinical Observations/Feedback:  Patient refused to attend group.    Timo Hartwig LRT/CTRS        Loveah Like 06/03/2022 11:52 AM 

## 2022-06-03 NOTE — Progress Notes (Signed)
[facility]  06/03/2022 1:35 PM Guy SauerMatthew Mcneill  MRN:  [id number] Subjective: Patient seen and chart reviewed.  41 year old man with a history of substance use and depression came to the hospital with suicidal ideation and physical distress alcohol withdrawal and opiate use.  Patient is very tearful today.  Poor eye contact.  Tearful difficulty speaking clearly.  Feeling very anxious.  Having some nausea and diarrhea.  Not eating well and not sleeping well.  Has not gotten Suboxone since being in the hospital but I have confirmed that he was taking it outside the hospital.  Still having some visual hallucinations and jitteriness but no seizures does not appear to be frankly delirious Principal Problem: MDD (major depressive disorder) Diagnosis: Principal Problem:   MDD (major depressive disorder) Active Problems:   ETOH abuse   Opiate use  Total Time spent with patient: 30 minutes  Past Psychiatric History: Past history of substance use problems and depression  Past Medical History:  Past Medical History:  Diagnosis Date   Cocaine abuse (HCC)    ETOH abuse    GERD (gastroesophageal reflux disease)    History of methicillin resistant staphylococcus aureus (MRSA)    History of participation in smoking cessation counseling    Hypertension    H/O IN 2016-PCP TOOK PT OFF BP MED DUE TO BP CONTROLLED    Past Surgical History:  Procedure Laterality Date   APPENDECTOMY     BAND HEMORRHOIDECTOMY     CARPAL TUNNEL RELEASE Right    clavicale surgery Left    SPINAL FUSION N/A 05/10/2022   VASECTOMY N/A 02/28/2017   Procedure: VASECTOMY;  Surgeon: Hildred LaserBudzyn, Brian James, MD;  Location: [facility] ORS;  Service: Urology;  Laterality: N/A;   VASECTOMY Bilateral 03/28/2017   Procedure: VASECTOMY;  Surgeon: Hildred LaserBudzyn, Brian James, MD;  Location: [facility] ORS;  Service: Urology;  Laterality: Bilateral;   Family History:  Family History  Problem Relation Age of Onset   Prostate cancer Neg Hx     Kidney cancer Neg Hx    Family Psychiatric  History: See previous Social History:  Social History   Substance and Sexual Activity  Alcohol Use Yes   Alcohol/week: 42.0 standard drinks of alcohol   Types: 42 Cans of beer per week   Comment: reports drinking between 6-8 beers daily in addition to a box of wine (4 bottles)  reporting may be exagerated     Social History   Substance and Sexual Activity  Drug Use Yes   Frequency: 2.0 times per week   Types: "Crack" cocaine, Marijuana    Social History   Socioeconomic History   Marital status: Married    Spouse name: Not on file   Number of children: 3   Years of education: Not on file   Highest education level: Not on file  Occupational History   Not on file  Tobacco Use   Smoking status: Every Day    Packs/day: 0.50    Years: 13.00    Total pack years: 6.50    Types: Cigarettes   Smokeless tobacco: Never  Vaping Use   Vaping Use: Never used  Substance and Sexual Activity   Alcohol use: Yes    Alcohol/week: 42.0 standard drinks of alcohol    Types: 42 Cans of beer per week    Comment: reports drinking between 6-8 beers daily in addition to a box of wine (4 bottles)  reporting may be exagerated   Drug use: Yes    Frequency:  2.0 times per week    Types: "Crack" cocaine, Marijuana   Sexual activity: Yes    Partners: Female  Other Topics Concern   Not on file  Social History Narrative   Not on file   Social Determinants of Health   Financial Resource Strain: Not on file  Food Insecurity: Food Insecurity Present (06/01/2022)   Hunger Vital Sign    Worried About Running Out of Food in the Last Year: Often true    Ran Out of Food in the Last Year: Often true  Transportation Needs: Unmet Transportation Needs (06/01/2022)   PRAPARE - Administrator, Civil Service (Medical): No    Lack of Transportation (Non-Medical): Yes  Physical Activity: Not on file  Stress: Not on file  Social Connections: Not on  file   Additional Social History:                         Sleep: Fair  Appetite:  Fair  Current Medications: Current Facility-Administered Medications  Medication Dose Route Frequency Provider Last Rate Last Admin   acetaminophen (TYLENOL) tablet 650 mg  650 mg Oral Q6H PRN Jearld Lesch, NP   650 mg at 06/03/22 0213   alum & mag hydroxide-simeth (MAALOX/MYLANTA) 200-200-20 MG/5ML suspension 30 mL  30 mL Oral Q4H PRN Jearld Lesch, NP       buprenorphine-naloxone (SUBOXONE) 8-2 mg per SL tablet 1 tablet  1 tablet Sublingual TID Paityn Balsam, Jackquline Denmark, MD   1 tablet at 06/03/22 1047   buprenorphine-naloxone (SUBOXONE) 8-2 mg per SL tablet 1 tablet  1 tablet Sublingual QHS Francella Barnett T, MD       folic acid (FOLVITE) tablet 1 mg  1 mg Oral Daily Beverly Sessions, MD   1 mg at 06/03/22 0841   hydrOXYzine (ATARAX) tablet 25 mg  25 mg Oral TID PRN Jearld Lesch, NP   25 mg at 06/01/22 1854   LORazepam (ATIVAN) tablet 1-4 mg  1-4 mg Oral Q1H PRN Beverly Sessions, MD       Or   LORazepam (ATIVAN) injection 1-4 mg  1-4 mg Intravenous Q1H PRN Beverly Sessions, MD       LORazepam (ATIVAN) tablet 0-4 mg  0-4 mg Oral Q4H Beverly Sessions, MD   2 mg at 06/03/22 1211   Followed by   Melene Muller ON 06/04/2022] LORazepam (ATIVAN) tablet 0-4 mg  0-4 mg Oral Q8H Beverly Sessions, MD       magnesium hydroxide (MILK OF MAGNESIA) suspension 30 mL  30 mL Oral Daily PRN Jearld Lesch, NP       multivitamin with minerals tablet 1 tablet  1 tablet Oral Daily Beverly Sessions, MD   1 tablet at 06/03/22 0841   nicotine polacrilex (NICORETTE) gum 2 mg  2 mg Oral PRN Delissa Silba, Jackquline Denmark, MD   2 mg at 06/03/22 1204   sertraline (ZOLOFT) tablet 50 mg  50 mg Oral Daily Beverly Sessions, MD   50 mg at 06/03/22 0841   thiamine (VITAMIN B1) tablet 100 mg  100 mg Oral Daily Beverly Sessions, MD   100 mg at 06/03/22 1224   Or   thiamine (VITAMIN B1) injection 100 mg  100 mg Intravenous Daily Beverly Sessions, MD       traZODone (DESYREL) tablet 50 mg  50 mg Oral QHS PRN Jearld Lesch, NP   50 mg at 06/01/22 2113    Lab Results:  Results  for orders placed or performed during the hospital encounter of 06/01/22 (from the past 48 hour(s))  RPR     Status: None   Collection Time: 06/02/22 12:48 AM  Result Value Ref Range   RPR Ser Ql NON REACTIVE NON REACTIVE    Comment: Performed at [facility], [address], [place], [place] [address]  Basic metabolic panel     Status: Abnormal   Collection Time: 06/02/22  1:27 PM  Result Value Ref Range   Sodium 137 135 - 145 mmol/L   Potassium 4.6 3.5 - 5.1 mmol/L   Chloride 99 98 - 111 mmol/L   CO2 31 22 - 32 mmol/L   Glucose, Bld 101 (H) 70 - 99 mg/dL    Comment: Glucose reference range applies only to samples taken after fasting for at least 8 hours.   BUN 20 6 - 20 mg/dL   Creatinine, Ser 2.83 0.61 - 1.24 mg/dL   Calcium 8.5 (L) 8.9 - 10.3 mg/dL   GFR, Estimated >15 >17 mL/min    Comment: (NOTE) Calculated using the CKD-EPI Creatinine Equation (2021)    Anion gap 7 5 - 15    Comment: Performed at [facility], [address] [place], [place] [address]  TSH     Status: None   Collection Time: 06/02/22  1:27 PM  Result Value Ref Range   TSH 1.896 0.350 - 4.500 uIU/mL    Comment: Performed by a 3rd Generation assay with a functional sensitivity of <=0.01 uIU/mL. Performed at [facility], [address] [place], [place] [address]     Blood Alcohol level:  Lab Results  Component Value Date   ETH 49 (H) 06/01/2022   ETH 276 (H) 04/07/2022    Metabolic Disorder Labs: No results found for: "HGBA1C", "MPG" No results found for: "PROLACTIN" No results found for: "CHOL", "TRIG", "HDL", "CHOLHDL", "VLDL", "LDLCALC"  Physical Findings: AIMS:  , ,  ,  ,    CIWA:  CIWA-Ar Total: 14 COWS:     Musculoskeletal: Strength & Muscle Tone: within normal limits Gait & Station: normal Patient leans:  N/A  Psychiatric Specialty Exam:  Presentation  General Appearance:  Casual  Eye Contact: Fair  Speech: Clear and Coherent  Speech Volume: Normal  Handedness: Right   Mood and Affect  Mood: Depressed; Dysphoric; Hopeless; Worthless  Affect: Depressed; Flat; Tearful   Thought Process  Thought Processes: Coherent  Descriptions of Associations:Intact  Orientation:Full (Time, Place and Person)  Thought Content:Logical; WDL  History of Schizophrenia/Schizoaffective disorder:No  Duration of Psychotic Symptoms:No data recorded Hallucinations:No data recorded Ideas of Reference:None  Suicidal Thoughts:No data recorded Homicidal Thoughts:No data recorded  Sensorium  Memory: Immediate Fair  Judgment: Impaired  Insight: Poor   Executive Functions  Concentration: Poor  Attention Span: Fair  Recall: Fair  Fund of Knowledge: Poor  Language: Fair   Psychomotor Activity  Psychomotor Activity:No data recorded  Assets  Assets: Communication Skills   Sleep  Sleep:No data recorded   Physical Exam: Physical Exam Vitals and nursing note reviewed.  Constitutional:      Appearance: Normal appearance.  HENT:     Head: Normocephalic and atraumatic.     Mouth/Throat:     Pharynx: Oropharynx is clear.  Eyes:     Pupils: Pupils are equal, round, and reactive to light.  Cardiovascular:     Rate and Rhythm: Normal rate and regular rhythm.  Pulmonary:     Effort: Pulmonary effort is normal.  Breath sounds: Normal breath sounds.  Abdominal:     General: Abdomen is flat.     Palpations: Abdomen is soft.  Musculoskeletal:        General: Normal range of motion.  Skin:    General: Skin is warm and dry.  Neurological:     General: No focal deficit present.     Mental Status: He is alert. Mental status is at baseline.  Psychiatric:        Attention and Perception: He is inattentive.        Mood and Affect: Mood is anxious and depressed.  Affect is tearful.        Speech: He is noncommunicative.        Behavior: Behavior is slowed and withdrawn.        Thought Content: Thought content includes suicidal ideation. Thought content does not include suicidal plan.        Cognition and Memory: Memory is impaired.        Judgment: Judgment is impulsive.    Review of Systems  Constitutional: Negative.   HENT: Negative.    Eyes: Negative.   Respiratory: Negative.    Cardiovascular: Negative.   Gastrointestinal:  Positive for diarrhea, nausea and vomiting.  Musculoskeletal: Negative.   Skin: Negative.   Neurological: Negative.   Psychiatric/Behavioral:  Positive for depression, hallucinations, substance abuse and suicidal ideas. The patient is nervous/anxious and has insomnia.    Blood pressure (!) 150/92, pulse 89, temperature 98.3 F (36.8 C), temperature source Oral, resp. rate 18, height 6\' 1"  (1.854 m), weight 75.8 kg, SpO2 99 %. Body mass index is 22.03 kg/m.   Treatment Plan Summary: Medication management and Plan reviewed multiple problems with patient.  Continue alcohol withdrawal protocol.  Restarted Suboxone 8 mg strips 3 times a day.  Confirmed in database.  Continue antidepressant.  Advised patient that his RPR was negative which he had been concerned about.  Encourage group attendance.  , MD 06/03/2022, 1:35 PM

## 2022-06-03 NOTE — Group Note (Signed)
[facility] LCSW Group Therapy Note    Group Date: 06/03/2022 Start Time: 1300 End Time: 1400  Type of Therapy and Topic:  Group Therapy:  Overcoming Obstacles  Participation Level:  [facility] PARTICIPATION LEVEL: Did Not Attend  Mood:  Description of Group:   In this group patients will be encouraged to explore what they see as obstacles to their own wellness and recovery. They will be guided to discuss their thoughts, feelings, and behaviors related to these obstacles. The group will process together ways to cope with barriers, with attention given to specific choices patients can make. Each patient will be challenged to identify changes they are motivated to make in order to overcome their obstacles. This group will be process-oriented, with patients participating in exploration of their own experiences as well as giving and receiving support and challenge from other group members.  Therapeutic Goals: 1. Patient will identify personal and current obstacles as they relate to admission. 2. Patient will identify barriers that currently interfere with their wellness or overcoming obstacles.  3. Patient will identify feelings, thought process and behaviors related to these barriers. 4. Patient will identify two changes they are willing to make to overcome these obstacles:    Summary of Patient Progress   Patient given the opportunity to attend group, however, declined.    Therapeutic Modalities:   Cognitive Behavioral Therapy Solution Focused Therapy Motivational Interviewing Relapse Prevention Therapy   Catina Nuss J Yanuel Tagg, LCSW 

## 2022-06-03 NOTE — BH IP Treatment Plan (Signed)
Interdisciplinary Treatment and Diagnostic Plan Update  06/03/2022 Time of Session: 09:34 Guy Rodriguez MRN: [id number]  Principal Diagnosis: MDD (major depressive disorder)  Secondary Diagnoses: Principal Problem:   MDD (major depressive disorder)   Current Medications:  Current Facility-Administered Medications  Medication Dose Route Frequency Provider Last Rate Last Admin   acetaminophen (TYLENOL) tablet 650 mg  650 mg Oral Q6H PRN Deloria Lair, NP   650 mg at 06/03/22 0213   alum & mag hydroxide-simeth (MAALOX/MYLANTA) 200-200-20 MG/5ML suspension 30 mL  30 mL Oral Q4H PRN Deloria Lair, NP       folic acid (FOLVITE) tablet 1 mg  1 mg Oral Daily Lenward Chancellor, MD   1 mg at 06/03/22 0841   hydrOXYzine (ATARAX) tablet 25 mg  25 mg Oral TID PRN Deloria Lair, NP   25 mg at 06/01/22 1854   LORazepam (ATIVAN) tablet 1-4 mg  1-4 mg Oral Q1H PRN Lenward Chancellor, MD       Or   LORazepam (ATIVAN) injection 1-4 mg  1-4 mg Intravenous Q1H PRN Lenward Chancellor, MD       LORazepam (ATIVAN) tablet 0-4 mg  0-4 mg Oral Q4H Lenward Chancellor, MD   2 mg at 06/03/22 0841   Followed by   Derrill Memo ON 06/04/2022] LORazepam (ATIVAN) tablet 0-4 mg  0-4 mg Oral Q8H Lenward Chancellor, MD       magnesium hydroxide (MILK OF MAGNESIA) suspension 30 mL  30 mL Oral Daily PRN Deloria Lair, NP       multivitamin with minerals tablet 1 tablet  1 tablet Oral Daily Lenward Chancellor, MD   1 tablet at 06/03/22 0841   nicotine polacrilex (NICORETTE) gum 2 mg  2 mg Oral PRN Clapacs, Madie Reno, MD   2 mg at 06/02/22 1654   sertraline (ZOLOFT) tablet 50 mg  50 mg Oral Daily Lenward Chancellor, MD   50 mg at 06/03/22 0841   thiamine (VITAMIN B1) tablet 100 mg  100 mg Oral Daily Lenward Chancellor, MD   100 mg at 06/03/22 6384   Or   thiamine (VITAMIN B1) injection 100 mg  100 mg Intravenous Daily Lenward Chancellor, MD       traZODone (DESYREL) tablet 50 mg  50 mg Oral QHS PRN Deloria Lair, NP   50 mg  at 06/01/22 2113   PTA Medications: Medications Prior to Admission  Medication Sig Dispense Refill Last Dose   oxyCODONE (OXY IR/ROXICODONE) 5 MG immediate release tablet Take 1 tablet (5 mg total) by mouth every 6 (six) hours as needed (pain). 16 tablet 0     Patient Stressors: Financial difficulties   Marital or family conflict   Substance abuse    Patient Strengths: Motivation for treatment/growth   Treatment Modalities: Medication Management, Group therapy, Case management,  1 to 1 session with clinician, Psychoeducation, Recreational therapy.   Physician Treatment Plan for Primary Diagnosis: MDD (major depressive disorder) Long Term Goal(s): Improvement in symptoms so as ready for discharge   Short Term Goals: Ability to identify changes in lifestyle to reduce recurrence of condition will improve Ability to verbalize feelings will improve Ability to disclose and discuss suicidal ideas Ability to demonstrate self-control will improve Ability to identify and develop effective coping behaviors will improve Ability to maintain clinical measurements within normal limits will improve Compliance with prescribed medications will improve Ability to identify triggers associated with substance abuse/mental health issues will improve  Medication Management: Evaluate patient's response, side effects, and  tolerance of medication regimen.  Therapeutic Interventions: 1 to 1 sessions, Unit Group sessions and Medication administration.  Evaluation of Outcomes: Not Met  Physician Treatment Plan for Secondary Diagnosis: Principal Problem:   MDD (major depressive disorder)  Long Term Goal(s): Improvement in symptoms so as ready for discharge   Short Term Goals: Ability to identify changes in lifestyle to reduce recurrence of condition will improve Ability to verbalize feelings will improve Ability to disclose and discuss suicidal ideas Ability to demonstrate self-control will  improve Ability to identify and develop effective coping behaviors will improve Ability to maintain clinical measurements within normal limits will improve Compliance with prescribed medications will improve Ability to identify triggers associated with substance abuse/mental health issues will improve     Medication Management: Evaluate patient's response, side effects, and tolerance of medication regimen.  Therapeutic Interventions: 1 to 1 sessions, Unit Group sessions and Medication administration.  Evaluation of Outcomes: Not Met   RN Treatment Plan for Primary Diagnosis: MDD (major depressive disorder) Long Term Goal(s): Knowledge of disease and therapeutic regimen to maintain health will improve  Short Term Goals: Ability to remain free from injury will improve, Ability to verbalize frustration and anger appropriately will improve, Ability to demonstrate self-control, Ability to participate in decision making will improve, Ability to verbalize feelings will improve, Ability to disclose and discuss suicidal ideas, Ability to identify and develop effective coping behaviors will improve, and Compliance with prescribed medications will improve  Medication Management: RN will administer medications as ordered by provider, will assess and evaluate patient's response and provide education to patient for prescribed medication. RN will report any adverse and/or side effects to prescribing provider.  Therapeutic Interventions: 1 on 1 counseling sessions, Psychoeducation, Medication administration, Evaluate responses to treatment, Monitor vital signs and CBGs as ordered, Perform/monitor CIWA, COWS, AIMS and Fall Risk screenings as ordered, Perform wound care treatments as ordered.  Evaluation of Outcomes: Not Met   LCSW Treatment Plan for Primary Diagnosis: MDD (major depressive disorder) Long Term Goal(s): Safe transition to appropriate next level of care at discharge, Engage patient in  therapeutic group addressing interpersonal concerns.  Short Term Goals: Engage patient in aftercare planning with referrals and resources, Increase social support, Increase ability to appropriately verbalize feelings, Increase emotional regulation, Facilitate acceptance of mental health diagnosis and concerns, Facilitate patient progression through stages of change regarding substance use diagnoses and concerns, Identify triggers associated with mental health/substance abuse issues, and Increase skills for wellness and recovery  Therapeutic Interventions: Assess for all discharge needs, 1 to 1 time with Social worker, Explore available resources and support systems, Assess for adequacy in community support network, Educate family and significant other(s) on suicide prevention, Complete Psychosocial Assessment, Interpersonal group therapy.  Evaluation of Outcomes: Not Met   Progress in Treatment: Attending groups: No. Participating in groups: No. Taking medication as prescribed: Yes. Toleration medication: Yes. Family/Significant other contact made: No, will contact:  if given permission. Patient understands diagnosis: Yes. Discussing patient identified problems/goals with staff: Yes. Medical problems stabilized or resolved: Yes. Denies suicidal/homicidal ideation: Yes. Issues/concerns per patient self-inventory: No. Other: none.  New problem(s) identified: No, Describe:  none identified.   New Short Term/Long Term Goal(s): detox, medication management for mood stabilization; elimination of SI thoughts; development of comprehensive mental wellness/sobriety plan.  Patient Goals:  "Getting to feel normal and I need help with housing. I have nowhere to stay."  Discharge Plan or Barriers: CSW will assist pt with development of an appropriate aftercare/discharge plan.  Reason for Continuation of Hospitalization: Depression Medication stabilization Suicidal ideation  Estimated Length of  Stay: 1-7 days  Last 3 [place] Suicide Severity Risk Score: Flowsheet Row Admission (Current) from 06/01/2022 in [place] Most recent reading at 06/01/2022  1:26 PM ED from 06/01/2022 in [place] Most recent reading at 06/01/2022 12:56 AM ED from 04/19/2022 in [place] Most recent reading at 04/20/2022 11:55 AM  C-SSRS RISK CATEGORY Moderate Risk High Risk No Risk       Last PHQ 2/9 Scores:     No data to display          Scribe for Treatment Team: Shirl Harris, LCSW 06/03/2022 9:47 AM

## 2022-06-03 NOTE — Plan of Care (Signed)
  Problem: Education: Goal: Knowledge of General Education information will improve Description: Including pain rating scale, medication(s)/side effects and non-pharmacologic comfort measures Outcome: Progressing   Problem: Coping: Goal: Level of anxiety will decrease Outcome: Progressing   Problem: Education: Goal: Emotional status will improve Outcome: Progressing Goal: Mental status will improve Outcome: Progressing   

## 2022-06-03 NOTE — Progress Notes (Signed)
Patient awake from sleep. Asking for something to eat and drink. Snack provided and CIWA conducted. CIWA  score 9 at this encounter. Ativan and Tylenol given to help with symptoms.     C Butler-Nicholson, LPN

## 2022-06-03 NOTE — Plan of Care (Signed)
D: Patient alert and oriented. Patient denies pain. Patient endorses anxiety and depression. Patient denies SI/HI/AVH.  Patient has been tearful throughout the day while getting scheduled medication. Patient has been isolative to room during shift with exception to coming out for meals and medication.   A: Scheduled medications administered to patient, per MD orders.  Support and encouragement provided to patient.  Q15 minute safety checks maintained.   R: Patient compliant with medication administration and treatment plan. No adverse drug reactions noted. Patient remains safe on the unit at this time. Problem: Education: Goal: Knowledge of [facility] Education information/materials will improve Outcome: Progressing Goal: Verbalization of understanding the information provided will improve Outcome: Progressing   Problem: Health Behavior/Discharge Planning: Goal: Compliance with treatment plan for underlying cause of condition will improve Outcome: Progressing   Problem: Physical Regulation: Goal: Ability to maintain clinical measurements within normal limits will improve Outcome: Progressing   Problem: Safety: Goal: Periods of time without injury will increase Outcome: Progressing

## 2022-06-03 NOTE — Progress Notes (Signed)
Patient in the room in the bed. Staff inquired if he was interested in getting snack for the night. Patient stated that he didn't feel like eating.  Asked if he got up later could he get snack at that time.  Staff assured him it would be ok.  Staff inquired about how he was feeling and he replied sleepy and asked if he could rest.  Staff will follow up later when next CIWA is due.  Q 15 minute safety checks in place. Patient  encouraged to seek staff with any concerns.     C Butler-Nicholson, LPN

## 2022-06-04 DIAGNOSIS — F332 Major depressive disorder, recurrent severe without psychotic features: Secondary | ICD-10-CM | POA: Diagnosis not present

## 2022-06-04 NOTE — Group Note (Signed)
LCSW Group Therapy Note   Group Date: 06/04/2022 Start Time: 1300 End Time: 1400   Type of Therapy and Topic:  Group Therapy: Challenging Core Beliefs  Participation Level:  Did Not Attend  Description of Group:  Patients were educated about core beliefs and asked to identify one harmful core belief that they have. Patients were asked to explore from where those beliefs originate. Patients were asked to discuss how those beliefs make them feel and the resulting behaviors of those beliefs. They were then be asked if those beliefs are true and, if so, what evidence they have to support them. Lastly, group members were challenged to replace those negative core beliefs with helpful beliefs.   Therapeutic Goals:   1. Patient will identify harmful core beliefs and explore the origins of such beliefs. 2. Patient will identify feelings and behaviors that result from those core beliefs. 3. Patient will discuss whether such beliefs are true. 4.  Patient will replace harmful core beliefs with helpful ones.  Summary of Patient Progress:   Patient did not attend group despite encouraged participation.    Therapeutic Modalities: Cognitive Behavioral Therapy; Solution-Focused Therapy   Corky Crafts, Theresia Majors 06/04/2022  2:04 PM

## 2022-06-04 NOTE — Progress Notes (Signed)
CIWA = 10 at this encounter.  Very tearful while obtaining vitals.  Received Ativan and Suboxone to help with symptoms.    C Butler-Nicholson, LPN

## 2022-06-04 NOTE — Progress Notes (Signed)
Patient provided qhs medication. Tolerated without issue. Will continue to monitor.    C Butler-Nicholson, LPN

## 2022-06-04 NOTE — Progress Notes (Signed)
Recreation Therapy Notes  INPATIENT RECREATION TR PLAN  Patient Details Name: Guy Rodriguez MRN: [id number] DOB: 08-Jun-1981 Today's Date: 06/04/2022  Rec Therapy Plan Is patient appropriate for Therapeutic Recreation?: Yes Treatment times per week: at least 3 Estimated Length of Stay: 5-7 days TR Treatment/Interventions: Group participation (Comment)  Discharge Criteria Pt will be discharged from therapy if:: Discharged Treatment plan/goals/alternatives discussed and agreed upon by:: Patient/family  Discharge Summary     [name] 06/04/2022, 3:39 PM

## 2022-06-04 NOTE — Plan of Care (Signed)
Pt endorses anxiety/depression at this time. Pt denies SI/HI/AVH or pain at this time. Pt is calm and cooperative. Pt is medication compliant. Pt provided with support and encouragement. Pt monitored q15 minutes for safety per unit policy. Plan of care ongoing.   Pt did not receive 0400 Ativan, pt was sleeping and did not wake. MD notified.  Problem: Coping: Goal: Level of anxiety will decrease Outcome: Not Progressing   Problem: Education: Goal: Emotional status will improve Outcome: Not Progressing

## 2022-06-04 NOTE — Progress Notes (Signed)
[facility] MD Progress Note  06/04/2022 3:32 PM Guy Rodriguez  MRN:  [id number] Subjective: Follow-up patient with depression and substance use withdrawal.  Still feeling sick and run down today.  Wife not answering his calls.  Still depressed passive suicidal thoughts.  Hopelessness.  Physically a little better but not eating very well. Principal Problem: MDD (major depressive disorder) Diagnosis: Principal Problem:   MDD (major depressive disorder) Active Problems:   ETOH abuse   Opiate use  Total Time spent with patient: 30 minutes  Past Psychiatric History: Past history of depression and alcohol abuse opiate use  Past Medical History:  Past Medical History:  Diagnosis Date   Cocaine abuse (HCC)    ETOH abuse    GERD (gastroesophageal reflux disease)    History of methicillin resistant staphylococcus aureus (MRSA)    History of participation in smoking cessation counseling    Hypertension    H/O IN 2016-PCP TOOK PT OFF BP MED DUE TO BP CONTROLLED    Past Surgical History:  Procedure Laterality Date   APPENDECTOMY     BAND HEMORRHOIDECTOMY     CARPAL TUNNEL RELEASE Right    clavicale surgery Left    SPINAL FUSION N/A 05/10/2022   VASECTOMY N/A 02/28/2017   Procedure: VASECTOMY;  Surgeon: Hildred Laser, MD;  Location: [facility] ORS;  Service: Urology;  Laterality: N/A;   VASECTOMY Bilateral 03/28/2017   Procedure: VASECTOMY;  Surgeon: Hildred Laser, MD;  Location: [facility] ORS;  Service: Urology;  Laterality: Bilateral;   Family History:  Family History  Problem Relation Age of Onset   Prostate cancer Neg Hx    Kidney cancer Neg Hx    Family Psychiatric  History: See previous Social History:  Social History   Substance and Sexual Activity  Alcohol Use Yes   Alcohol/week: 42.0 standard drinks of alcohol   Types: 42 Cans of beer per week   Comment: reports drinking between 6-8 beers daily in addition to a box of wine (4 bottles)  reporting may be exagerated      Social History   Substance and Sexual Activity  Drug Use Yes   Frequency: 2.0 times per week   Types: "Crack" cocaine, Marijuana    Social History   Socioeconomic History   Marital status: Married    Spouse name: Not on file   Number of children: 3   Years of education: Not on file   Highest education level: Not on file  Occupational History   Not on file  Tobacco Use   Smoking status: Every Day    Packs/day: 0.50    Years: 13.00    Total pack years: 6.50    Types: Cigarettes   Smokeless tobacco: Never  Vaping Use   Vaping Use: Never used  Substance and Sexual Activity   Alcohol use: Yes    Alcohol/week: 42.0 standard drinks of alcohol    Types: 42 Cans of beer per week    Comment: reports drinking between 6-8 beers daily in addition to a box of wine (4 bottles)  reporting may be exagerated   Drug use: Yes    Frequency: 2.0 times per week    Types: "Crack" cocaine, Marijuana   Sexual activity: Yes    Partners: Female  Other Topics Concern   Not on file  Social History Narrative   Not on file   Social Determinants of Health   Financial Resource Strain: Not on file  Food Insecurity: Food Insecurity Present (06/01/2022)   Hunger  Vital Sign    Worried About Programme researcher, broadcasting/film/video in the Last Year: Often true    Ran Out of Food in the Last Year: Often true  Transportation Needs: Unmet Transportation Needs (06/01/2022)   PRAPARE - Administrator, Civil Service (Medical): No    Lack of Transportation (Non-Medical): Yes  Physical Activity: Not on file  Stress: Not on file  Social Connections: Not on file   Additional Social History:                         Sleep: Fair  Appetite:  Fair  Current Medications: Current Facility-Administered Medications  Medication Dose Route Frequency Provider Last Rate Last Admin   acetaminophen (TYLENOL) tablet 650 mg  650 mg Oral Q6H PRN Jearld Lesch, NP   650 mg at 06/03/22 0213   alum & mag  hydroxide-simeth (MAALOX/MYLANTA) 200-200-20 MG/5ML suspension 30 mL  30 mL Oral Q4H PRN Jearld Lesch, NP       buprenorphine-naloxone (SUBOXONE) 8-2 mg per SL tablet 1 tablet  1 tablet Sublingual TID Beata Beason, Jackquline Denmark, MD   1 tablet at 06/04/22 1238   folic acid (FOLVITE) tablet 1 mg  1 mg Oral Daily Beverly Sessions, MD   1 mg at 06/04/22 4827   hydrOXYzine (ATARAX) tablet 25 mg  25 mg Oral TID PRN Jearld Lesch, NP   25 mg at 06/01/22 1854   LORazepam (ATIVAN) tablet 1-4 mg  1-4 mg Oral Q1H PRN Beverly Sessions, MD       Or   LORazepam (ATIVAN) injection 1-4 mg  1-4 mg Intravenous Q1H PRN Beverly Sessions, MD       LORazepam (ATIVAN) tablet 0-4 mg  0-4 mg Oral Q8H Beverly Sessions, MD   1 mg at 06/04/22 1240   magnesium hydroxide (MILK OF MAGNESIA) suspension 30 mL  30 mL Oral Daily PRN Jearld Lesch, NP       multivitamin with minerals tablet 1 tablet  1 tablet Oral Daily Beverly Sessions, MD   1 tablet at 06/04/22 0786   nicotine polacrilex (NICORETTE) gum 2 mg  2 mg Oral PRN Kera Deacon, Jackquline Denmark, MD   2 mg at 06/04/22 1244   sertraline (ZOLOFT) tablet 50 mg  50 mg Oral Daily Beverly Sessions, MD   50 mg at 06/04/22 7544   thiamine (VITAMIN B1) tablet 100 mg  100 mg Oral Daily Beverly Sessions, MD   100 mg at 06/04/22 9201   Or   thiamine (VITAMIN B1) injection 100 mg  100 mg Intravenous Daily Beverly Sessions, MD       traZODone (DESYREL) tablet 50 mg  50 mg Oral QHS PRN Jearld Lesch, NP   50 mg at 06/03/22 2303    Lab Results: No results found for this or any previous visit (from the past 48 hour(s)).  Blood Alcohol level:  Lab Results  Component Value Date   ETH 49 (H) 06/01/2022   ETH 276 (H) 04/07/2022    Metabolic Disorder Labs: No results found for: "HGBA1C", "MPG" No results found for: "PROLACTIN" No results found for: "CHOL", "TRIG", "HDL", "CHOLHDL", "VLDL", "LDLCALC"  Physical Findings: AIMS:  , ,  ,  ,    CIWA:  CIWA-Ar Total: 10 COWS:      Musculoskeletal: Strength & Muscle Tone: within normal limits Gait & Station: normal Patient leans: N/A  Psychiatric Specialty Exam:  Presentation  General Appearance:  Casual  Eye Contact: Fair  Speech: Clear and Coherent  Speech Volume: Normal  Handedness: Right   Mood and Affect  Mood: Depressed; Dysphoric; Hopeless; Worthless  Affect: Depressed; Flat; Tearful   Thought Process  Thought Processes: Coherent  Descriptions of Associations:Intact  Orientation:Full (Time, Place and Person)  Thought Content:Logical; WDL  History of Schizophrenia/Schizoaffective disorder:No  Duration of Psychotic Symptoms:No data recorded Hallucinations:No data recorded Ideas of Reference:None  Suicidal Thoughts:No data recorded Homicidal Thoughts:No data recorded  Sensorium  Memory: Immediate Fair  Judgment: Impaired  Insight: Poor   Executive Functions  Concentration: Poor  Attention Span: Fair  Recall: Fair  Fund of Knowledge: Poor  Language: Fair   Psychomotor Activity  Psychomotor Activity:No data recorded  Assets  Assets: Communication Skills   Sleep  Sleep:No data recorded   Physical Exam: Physical Exam Vitals and nursing note reviewed.  Constitutional:      Appearance: Normal appearance.  HENT:     Head: Normocephalic and atraumatic.     Mouth/Throat:     Pharynx: Oropharynx is clear.  Eyes:     Pupils: Pupils are equal, round, and reactive to light.  Cardiovascular:     Rate and Rhythm: Normal rate and regular rhythm.  Pulmonary:     Effort: Pulmonary effort is normal.     Breath sounds: Normal breath sounds.  Abdominal:     General: Abdomen is flat.     Palpations: Abdomen is soft.  Musculoskeletal:        General: Normal range of motion.  Skin:    General: Skin is warm and dry.  Neurological:     General: No focal deficit present.     Mental Status: He is alert. Mental status is at baseline.  Psychiatric:         Attention and Perception: He is inattentive.        Mood and Affect: Mood is anxious and depressed. Affect is tearful.        Speech: Speech is delayed.        Behavior: Behavior is slowed.        Thought Content: Thought content normal.        Cognition and Memory: Cognition is impaired. Memory is impaired.    Review of Systems  Constitutional: Negative.   HENT: Negative.    Eyes: Negative.   Respiratory: Negative.    Cardiovascular: Negative.   Gastrointestinal: Negative.   Musculoskeletal: Negative.   Skin: Negative.   Neurological: Negative.   Psychiatric/Behavioral:  Positive for depression.    Blood pressure 128/82, pulse 77, temperature 98.3 F (36.8 C), temperature source Oral, resp. rate 18, height 6\' 1"  (1.854 m), weight 75.8 kg, SpO2 99 %. Body mass index is 22.03 kg/m.   Treatment Plan Summary: Plan supportive therapy and counseling.  Encourage group attendance.  No change to medicine for today.  Encourage patient to talk with social work about rehab options if that is what he would like.  , MD 06/04/2022, 3:32 PM

## 2022-06-04 NOTE — Plan of Care (Signed)
D: Patient alert and oriented. Patient denies pain. Patient endorses anxiety and depression. Patient denies SI/HI/AVH. Patient has been tearful throughout the day. Patient endorsed anxiety after not being able to get a hold of anyone on the phone after many attempts. PRN hydroxyzine provided to patient for anxiety.  A: Scheduled medications administered to patient, per MD orders.  Support and encouragement provided to patient.  Q15 minute safety checks maintained.   R: Patient compliant with medication administration and treatment plan. No adverse drug reactions noted. Patient remains safe on the unit at this time. Problem: Education: Goal: Knowledge of [facility] Education information/materials will improve Outcome: Progressing Goal: Verbalization of understanding the information provided will improve Outcome: Progressing   Problem: Health Behavior/Discharge Planning: Goal: Compliance with treatment plan for underlying cause of condition will improve Outcome: Progressing   Problem: Physical Regulation: Goal: Ability to maintain clinical measurements within normal limits will improve Outcome: Progressing   Problem: Safety: Goal: Periods of time without injury will increase Outcome: Progressing

## 2022-06-04 NOTE — Progress Notes (Signed)
Recreation Therapy Notes  Date: 06/04/2022  Time: 2:05pm     Location: Court yard   Behavioral response: N/A  Intervention Topic: Leisure   Discussion/Intervention: Patient refused to attend group.   Clinical Observations/Feedback:  Patient refused to attend group.    Ania Levay LRT/CTRS        Hulbert Branscome 06/04/2022 3:05 PM

## 2022-06-04 NOTE — Progress Notes (Signed)
Recreation Therapy Notes  INPATIENT RECREATION THERAPY ASSESSMENT  Patient Details Name: Deontae Robson MRN: [id number] DOB: 1980-12-21 Today's Date: 06/04/2022       Information Obtained From: Patient  Able to Participate in Assessment/Interview: Yes  Patient Presentation: Responsive  Reason for Admission (Per Patient): Active Symptoms  Patient Stressors:    Coping Skills:   Isolation, Avoidance, Exercise  Leisure Interests (2+):  Exercise - Walking, Therapist, art, Social - Family  Frequency of Recreation/Participation: Chief Executive Officer of Community Resources:  Yes  Community Resources:  Park  Current Use: Yes  If no, Barriers?:    Expressed Interest in [employer] Information: Yes  [employer] of Residence:  Film/video editor  Patient Main Form of Transportation: Therapist, music  Patient Strengths:  Kind, Helpful  Patient Identified Areas of Improvement:  Not be so generous  Patient Goal for Hospitalization:  Feel normal and find somewhere to stay.  Current SI (including self-harm):  No  Current HI:  No  Current AVH: No  Staff Intervention Plan: Group Attendance, Collaborate with Interdisciplinary Treatment Team  Consent to Intern Participation: N/A  Pasqual Farias 06/04/2022, 3:38 PM

## 2022-06-05 DIAGNOSIS — F332 Major depressive disorder, recurrent severe without psychotic features: Secondary | ICD-10-CM | POA: Diagnosis not present

## 2022-06-05 NOTE — Progress Notes (Signed)
[facility] MD Progress Note  06/05/2022 3:33 PM Guy Rodriguez  MRN:  [id number] Subjective: Patient seen and chart reviewed.  Patient was diaphoretic nervous still partially tearful when in bed today.  Has not been out to interact very much.  The worst of the opiate withdrawal is now under good control.  His vital signs are stable.  Still very depressed and sluggish. Principal Problem: MDD (major depressive disorder) Diagnosis: Principal Problem:   MDD (major depressive disorder) Active Problems:   ETOH abuse   Opiate use  Total Time spent with patient: 30 minutes  Past Psychiatric History: Past history of alcohol abuse substance abuse severe depression.  Past Medical History:  Past Medical History:  Diagnosis Date   Cocaine abuse (HCC)    ETOH abuse    GERD (gastroesophageal reflux disease)    History of methicillin resistant staphylococcus aureus (MRSA)    History of participation in smoking cessation counseling    Hypertension    H/O IN 2016-PCP TOOK PT OFF BP MED DUE TO BP CONTROLLED    Past Surgical History:  Procedure Laterality Date   APPENDECTOMY     BAND HEMORRHOIDECTOMY     CARPAL TUNNEL RELEASE Right    clavicale surgery Left    SPINAL FUSION N/A 05/10/2022   VASECTOMY N/A 02/28/2017   Procedure: VASECTOMY;  Surgeon: Hildred Laser, MD;  Location: [facility] ORS;  Service: Urology;  Laterality: N/A;   VASECTOMY Bilateral 03/28/2017   Procedure: VASECTOMY;  Surgeon: Hildred Laser, MD;  Location: [facility] ORS;  Service: Urology;  Laterality: Bilateral;   Family History:  Family History  Problem Relation Age of Onset   Prostate cancer Neg Hx    Kidney cancer Neg Hx    Family Psychiatric  History: See previous Social History:  Social History   Substance and Sexual Activity  Alcohol Use Yes   Alcohol/week: 42.0 standard drinks of alcohol   Types: 42 Cans of beer per week   Comment: reports drinking between 6-8 beers daily in addition to a box of wine (4 bottles)   reporting may be exagerated     Social History   Substance and Sexual Activity  Drug Use Yes   Frequency: 2.0 times per week   Types: "Crack" cocaine, Marijuana    Social History   Socioeconomic History   Marital status: Married    Spouse name: Not on file   Number of children: 3   Years of education: Not on file   Highest education level: Not on file  Occupational History   Not on file  Tobacco Use   Smoking status: Every Day    Packs/day: 0.50    Years: 13.00    Total pack years: 6.50    Types: Cigarettes   Smokeless tobacco: Never  Vaping Use   Vaping Use: Never used  Substance and Sexual Activity   Alcohol use: Yes    Alcohol/week: 42.0 standard drinks of alcohol    Types: 42 Cans of beer per week    Comment: reports drinking between 6-8 beers daily in addition to a box of wine (4 bottles)  reporting may be exagerated   Drug use: Yes    Frequency: 2.0 times per week    Types: "Crack" cocaine, Marijuana   Sexual activity: Yes    Partners: Female  Other Topics Concern   Not on file  Social History Narrative   Not on file   Social Determinants of Health   Financial Resource Strain: Not on file  Food Insecurity: Food Insecurity Present (06/01/2022)   Hunger Vital Sign    Worried About Running Out of Food in the Last Year: Often true    Ran Out of Food in the Last Year: Often true  Transportation Needs: Unmet Transportation Needs (06/01/2022)   PRAPARE - Administrator, Civil Service (Medical): No    Lack of Transportation (Non-Medical): Yes  Physical Activity: Not on file  Stress: Not on file  Social Connections: Not on file   Additional Social History:                         Sleep: Fair  Appetite:  Poor  Current Medications: Current Facility-Administered Medications  Medication Dose Route Frequency Provider Last Rate Last Admin   acetaminophen (TYLENOL) tablet 650 mg  650 mg Oral Q6H PRN Jearld Lesch, NP   650 mg at  06/05/22 0807   alum & mag hydroxide-simeth (MAALOX/MYLANTA) 200-200-20 MG/5ML suspension 30 mL  30 mL Oral Q4H PRN Jearld Lesch, NP       buprenorphine-naloxone (SUBOXONE) 8-2 mg per SL tablet 1 tablet  1 tablet Sublingual TID Kennie Snedden, Jackquline Denmark, MD   1 tablet at 06/05/22 1141   folic acid (FOLVITE) tablet 1 mg  1 mg Oral Daily Beverly Sessions, MD   1 mg at 06/05/22 5809   hydrOXYzine (ATARAX) tablet 25 mg  25 mg Oral TID PRN Jearld Lesch, NP   25 mg at 06/05/22 9833   LORazepam (ATIVAN) tablet 0-4 mg  0-4 mg Oral Q8H Beverly Sessions, MD   1 mg at 06/05/22 1142   magnesium hydroxide (MILK OF MAGNESIA) suspension 30 mL  30 mL Oral Daily PRN Jearld Lesch, NP   30 mL at 06/05/22 8250   multivitamin with minerals tablet 1 tablet  1 tablet Oral Daily Beverly Sessions, MD   1 tablet at 06/05/22 5397   nicotine polacrilex (NICORETTE) gum 2 mg  2 mg Oral PRN Salena Ortlieb, Jackquline Denmark, MD   2 mg at 06/05/22 1252   sertraline (ZOLOFT) tablet 50 mg  50 mg Oral Daily Beverly Sessions, MD   50 mg at 06/05/22 6734   thiamine (VITAMIN B1) tablet 100 mg  100 mg Oral Daily Beverly Sessions, MD   100 mg at 06/05/22 1937   Or   thiamine (VITAMIN B1) injection 100 mg  100 mg Intravenous Daily Beverly Sessions, MD       traZODone (DESYREL) tablet 50 mg  50 mg Oral QHS PRN Jearld Lesch, NP   50 mg at 06/04/22 2116    Lab Results: No results found for this or any previous visit (from the past 48 hour(s)).  Blood Alcohol level:  Lab Results  Component Value Date   ETH 49 (H) 06/01/2022   ETH 276 (H) 04/07/2022    Metabolic Disorder Labs: No results found for: "HGBA1C", "MPG" No results found for: "PROLACTIN" No results found for: "CHOL", "TRIG", "HDL", "CHOLHDL", "VLDL", "LDLCALC"  Physical Findings: AIMS: Facial and Oral Movements Muscles of Facial Expression: None, normal Lips and Perioral Area: None, normal Jaw: None, normal Tongue: None, normal,Extremity Movements Upper (arms, wrists,  hands, fingers): None, normal Lower (legs, knees, ankles, toes): None, normal, Trunk Movements Neck, shoulders, hips: None, normal, Overall Severity Severity of abnormal movements (highest score from questions above): None, normal Incapacitation due to abnormal movements: None, normal Patient's awareness of abnormal movements (rate only patient's report): No Awareness, Dental Status Current  problems with teeth and/or dentures?: No Does patient usually wear dentures?: No  CIWA:  CIWA-Ar Total: 9 COWS:     Musculoskeletal: Strength & Muscle Tone: within normal limits Gait & Station: normal, shuffle Patient leans: N/A  Psychiatric Specialty Exam:  Presentation  General Appearance:  Casual  Eye Contact: Fair  Speech: Clear and Coherent  Speech Volume: Normal  Handedness: Right   Mood and Affect  Mood: Depressed; Dysphoric; Hopeless; Worthless  Affect: Depressed; Flat; Tearful   Thought Process  Thought Processes: Coherent  Descriptions of Associations:Intact  Orientation:Full (Time, Place and Person)  Thought Content:Logical; WDL  History of Schizophrenia/Schizoaffective disorder:No  Duration of Psychotic Symptoms:No data recorded Hallucinations:No data recorded Ideas of Reference:None  Suicidal Thoughts:No data recorded Homicidal Thoughts:No data recorded  Sensorium  Memory: Immediate Fair  Judgment: Impaired  Insight: Poor   Executive Functions  Concentration: Poor  Attention Span: Fair  Recall: Fair  Fund of Knowledge: Poor  Language: Fair   Psychomotor Activity  Psychomotor Activity:No data recorded  Assets  Assets: Communication Skills   Sleep  Sleep:No data recorded   Physical Exam: Physical Exam Vitals and nursing note reviewed.  Constitutional:      Appearance: Normal appearance.  HENT:     Head: Normocephalic and atraumatic.     Mouth/Throat:     Pharynx: Oropharynx is clear.  Eyes:     Pupils: Pupils  are equal, round, and reactive to light.  Cardiovascular:     Rate and Rhythm: Normal rate and regular rhythm.  Pulmonary:     Effort: Pulmonary effort is normal.     Breath sounds: Normal breath sounds.  Abdominal:     General: Abdomen is flat.     Palpations: Abdomen is soft.  Musculoskeletal:        General: Normal range of motion.  Skin:    General: Skin is warm and dry.  Neurological:     General: No focal deficit present.     Mental Status: He is alert. Mental status is at baseline.  Psychiatric:        Attention and Perception: Attention normal.        Mood and Affect: Mood is depressed. Affect is tearful.        Speech: Speech is delayed.        Behavior: Behavior is slowed.        Thought Content: Thought content includes suicidal ideation. Thought content does not include suicidal plan.        Cognition and Memory: Cognition normal.    Review of Systems  Constitutional:  Positive for chills and malaise/fatigue.  HENT: Negative.    Eyes: Negative.   Respiratory: Negative.    Cardiovascular: Negative.   Gastrointestinal: Negative.   Musculoskeletal: Negative.   Skin: Negative.   Neurological: Negative.   Psychiatric/Behavioral:  Positive for depression. The patient is nervous/anxious.    Blood pressure 131/88, pulse 68, temperature 98.2 F (36.8 C), temperature source Oral, resp. rate 18, height 6\' 1"  (1.854 m), weight 75.8 kg, SpO2 98 %. Body mass index is 22.03 kg/m.   Treatment Plan Summary: Medication management and Plan no change to current medication management.  Urged patient when he is comfortable to get up out of bed and interact with others and go to groups and talk to social work about possible discharge options including shelters or rehab options.  , MD 06/05/2022, 3:33 PM

## 2022-06-05 NOTE — [facility] Group Notes (Signed)
[facility] Group Notes:  (Nursing/MHT/Case Management/Adjunct)  Date:  06/05/2022  Time:  9:44 AM  Type of Therapy:   Community Meeting  Participation Level:  Did Not Attend   Lynelle Smoke [facility] 06/05/2022, 9:44 AM

## 2022-06-05 NOTE — Progress Notes (Signed)
Pt denies SI/HI/AVH and verbally agrees to approach staff if these become apparent or before harming themselves/others. Rates depression 7/10. Rates anxiety 4/10. Rates pain 8/10. Pt has been in his room for most of the day and has not attended groups. Pt will come our for meals and meds. Scheduled medications administered to pt, per MD orders. RN provided support and encouragement to pt. Q15 min safety checks implemented and continued. Pt safe on the unit. RN will continue to monitor and intervene as needed.  06/05/22 0807  Psych Admission Type (Psych Patients Only)  Admission Status Voluntary  Psychosocial Assessment  Patient Complaints Anxiety;Depression  Eye Contact Fair  Facial Expression Sad  Affect Anxious;Depressed;Sad  Speech Logical/coherent  Interaction Assertive  Motor Activity Slow  Appearance/Hygiene Poor hygiene  Behavior Characteristics Cooperative;Appropriate to situation;Calm  Mood Depressed;Anxious  Thought Process  Coherency WDL  Content WDL  Delusions None reported or observed  Perception WDL  Hallucination None reported or observed  Judgment Limited  Confusion None  Danger to Self  Current suicidal ideation? Denies  Danger to Others  Danger to Others None reported or observed

## 2022-06-05 NOTE — Group Note (Signed)
[facility] LCSW Group Therapy Note   Group Date: 06/05/2022 Start Time: 1310 End Time: 1420   Type of Therapy/Topic:  Group Therapy:  Emotion Regulation  Participation Level:  Did Not Attend    Description of Group:    The purpose of this group is to assist patients in learning to regulate negative emotions and experience positive emotions. Patients will be guided to discuss ways in which they have been vulnerable to their negative emotions. These vulnerabilities will be juxtaposed with experiences of positive emotions or situations, and patients challenged to use positive emotions to combat negative ones. Special emphasis will be placed on coping with negative emotions in conflict situations, and patients will process healthy conflict resolution skills.  Therapeutic Goals: Patient will identify two positive emotions or experiences to reflect on in order to balance out negative emotions:  Patient will label two or more emotions that they find the most difficult to experience:  Patient will be able to demonstrate positive conflict resolution skills through discussion or role plays:   Summary of Patient Progress: X   Therapeutic Modalities:   Cognitive Behavioral Therapy Feelings Identification Dialectical Behavioral Therapy   Zanae Kuehnle R Yaira Bernardi, LCSW 

## 2022-06-05 NOTE — Progress Notes (Signed)
Recreation Therapy Notes  Date: 06/05/2022   Time: 10:50 am   Location: Craft room    Behavioral response: N/A   Intervention Topic: Values    Discussion/Intervention: Patient refused to attend group.    Clinical Observations/Feedback:  Patient refused to attend group.    Kaileah Shevchenko LRT/CTRS          Larwence Tu 06/05/2022 11:13 AM 

## 2022-06-05 NOTE — Plan of Care (Signed)
  Problem: Coping: Goal: Level of anxiety will decrease Outcome: Progressing   Problem: Education: Goal: Emotional status will improve Outcome: Progressing

## 2022-06-06 ENCOUNTER — Other Ambulatory Visit: Payer: Self-pay

## 2022-06-06 MED ORDER — NICOTINE POLACRILEX 2 MG MT GUM
2.0000 mg | CHEWING_GUM | OROMUCOSAL | 0 refills | Status: AC | PRN
  Filled 2022-06-06: qty 110, 30d supply, fill #0

## 2022-06-06 MED ORDER — BUPRENORPHINE HCL-NALOXONE HCL 8-2 MG SL SUBL: 1.0000 | SUBLINGUAL_TABLET | Freq: Three times a day (TID) | SUBLINGUAL | 0 refills | Status: AC

## 2022-06-06 MED ORDER — HYDROXYZINE HCL 25 MG PO TABS
25.0000 mg | ORAL_TABLET | Freq: Three times a day (TID) | ORAL | 0 refills | Status: AC | PRN
  Filled 2022-06-06: qty 30, 10d supply, fill #0

## 2022-06-06 MED ORDER — HYDROXYZINE HCL 25 MG PO TABS: 25.0000 mg | ORAL_TABLET | Freq: Three times a day (TID) | ORAL | 1 refills | Status: DC | PRN

## 2022-06-06 MED ORDER — SERTRALINE HCL 50 MG PO TABS: 50.0000 mg | ORAL_TABLET | Freq: Every day | ORAL | 1 refills | Status: DC

## 2022-06-06 MED ORDER — TRAZODONE HCL 50 MG PO TABS: 50.0000 mg | ORAL_TABLET | Freq: Every evening | ORAL | 1 refills | Status: DC | PRN

## 2022-06-06 MED ORDER — NICOTINE POLACRILEX 2 MG MT GUM: 2.0000 mg | CHEWING_GUM | OROMUCOSAL | 1 refills | Status: DC | PRN

## 2022-06-06 MED ORDER — TRAZODONE HCL 50 MG PO TABS
50.0000 mg | ORAL_TABLET | Freq: Every evening | ORAL | 0 refills | Status: AC | PRN
  Filled 2022-06-06: qty 30, 30d supply, fill #0

## 2022-06-06 MED ORDER — SERTRALINE HCL 50 MG PO TABS
50.0000 mg | ORAL_TABLET | Freq: Every day | ORAL | 0 refills | Status: AC
  Filled 2022-06-06: qty 30, 30d supply, fill #0

## 2022-06-06 NOTE — [facility] Suicide Risk Assessment (Signed)
[facility] Discharge Suicide Risk Assessment   Principal Problem: MDD (major depressive disorder) Discharge Diagnoses: Principal Problem:   MDD (major depressive disorder) Active Problems:   ETOH abuse   Opiate use   Total Time spent with patient: 30 minutes  Musculoskeletal: Strength & Muscle Tone: within normal limits Gait & Station: normal Patient leans: N/A  Psychiatric Specialty Exam  Presentation  General Appearance:  Casual  Eye Contact: Fair  Speech: Clear and Coherent  Speech Volume: Normal  Handedness: Right   Mood and Affect  Mood: Depressed; Dysphoric; Hopeless; Worthless  Duration of Depression Symptoms: Greater than two weeks  Affect: Depressed; Flat; Tearful   Thought Process  Thought Processes: Coherent  Descriptions of Associations:Intact  Orientation:Full (Time, Place and Person)  Thought Content:Logical; WDL  History of Schizophrenia/Schizoaffective disorder:No  Duration of Psychotic Symptoms:No data recorded Hallucinations:No data recorded Ideas of Reference:None  Suicidal Thoughts:No data recorded Homicidal Thoughts:No data recorded  Sensorium  Memory: Immediate Fair  Judgment: Impaired  Insight: Poor   Executive Functions  Concentration: Poor  Attention Span: Fair  Recall: Fair  Fund of Knowledge: Poor  Language: Fair   Psychomotor Activity  Psychomotor Activity:No data recorded  Assets  Assets: Communication Skills   Sleep  Sleep:No data recorded  Physical Exam: Physical Exam Vitals and nursing note reviewed.  Constitutional:      Appearance: Normal appearance.  HENT:     Head: Normocephalic and atraumatic.     Mouth/Throat:     Pharynx: Oropharynx is clear.  Eyes:     Pupils: Pupils are equal, round, and reactive to light.  Cardiovascular:     Rate and Rhythm: Normal rate and regular rhythm.  Pulmonary:     Effort: Pulmonary effort is normal.     Breath sounds: Normal breath  sounds.  Abdominal:     General: Abdomen is flat.     Palpations: Abdomen is soft.  Musculoskeletal:        General: Normal range of motion.  Skin:    General: Skin is warm and dry.  Neurological:     General: No focal deficit present.     Mental Status: He is alert. Mental status is at baseline.  Psychiatric:        Attention and Perception: Attention normal.        Mood and Affect: Mood normal.        Speech: Speech normal.        Behavior: Behavior is cooperative.        Thought Content: Thought content normal.        Cognition and Memory: Cognition normal.    Review of Systems  Constitutional: Negative.   HENT: Negative.    Eyes: Negative.   Respiratory: Negative.    Cardiovascular: Negative.   Gastrointestinal: Negative.   Musculoskeletal: Negative.   Skin: Negative.   Neurological: Negative.   Psychiatric/Behavioral: Negative.     Blood pressure 109/79, pulse 60, temperature 98.4 F (36.9 C), temperature source Oral, resp. rate 18, height 6\' 1"  (1.854 m), weight 75.8 kg, SpO2 97 %. Body mass index is 22.03 kg/m.  Mental Status Per Nursing Assessment::   On Admission:  Suicidal ideation indicated by patient, Suicide plan  Demographic Factors:  Male and Low socioeconomic status  Loss Factors: Financial problems/change in socioeconomic status  Historical Factors: Impulsivity  Risk Reduction Factors:   Sense of responsibility to family  Continued Clinical Symptoms:  Depression:   Comorbid alcohol abuse/dependence Alcohol/Substance Abuse/Dependencies  Cognitive Features That Contribute To  Risk:  None    Suicide Risk:  Minimal: No identifiable suicidal ideation.  Patients presenting with no risk factors but with morbid ruminations; may be classified as minimal risk based on the severity of the depressive symptoms    Plan Of Care/Follow-up recommendations:  Other:  Patient is no longer endorsing any suicidal thoughts affect appears to be brighter and he  is focused on multiple positive plans for the future.  Patient has been offered options for outpatient substance use treatment.  At this point he does not appear to be an acute danger to himself and will be discharged to outpatient care tomorrow.  Alethia Berthold, MD 06/06/2022, 1:31 PM

## 2022-06-06 NOTE — Group Note (Signed)
[facility] LCSW Group Therapy Note   Group Date: 06/06/2022 Start Time: 1300 End Time: 1400   Type of Therapy/Topic:  Group Therapy:  Balance in Life  Participation Level:  Did Not Attend   Description of Group:    This group will address the concept of balance and how it feels and looks when one is unbalanced. Patients will be encouraged to process areas in their lives that are out of balance, and identify reasons for remaining unbalanced. Facilitators will guide patients utilizing problem- solving interventions to address and correct the stressor making their life unbalanced. Understanding and applying boundaries will be explored and addressed for obtaining  and maintaining a balanced life. Patients will be encouraged to explore ways to assertively make their unbalanced needs known to significant others in their lives, using other group members and facilitator for support and feedback.  Therapeutic Goals: Patient will identify two or more emotions or situations they have that consume much of in their lives. Patient will identify signs/triggers that life has become out of balance:  Patient will identify two ways to set boundaries in order to achieve balance in their lives:  Patient will demonstrate ability to communicate their needs through discussion and/or role plays  Summary of Patient Progress:    Patient declined to attend group, though encouraged by this clinician.    Therapeutic Modalities:   Cognitive Behavioral Therapy Solution-Focused Therapy Assertiveness Training   Jaysin Gayler J Cailynn Bodnar, LCSW 

## 2022-06-06 NOTE — Plan of Care (Signed)
Pt endorses anxiety/depression at this time. Pt denies SI/HI/AVH however, endorses 7/10 chronic lower back pain at this time. Pt is calm and cooperative. Pt is medication compliant. Pt provided with support and encouragement. Pt monitored q15 minutes for safety per unit policy. Plan of care ongoing.    Problem: Coping: Goal: Level of anxiety will decrease Outcome: Not Progressing   Problem: Education: Goal: Emotional status will improve Outcome: Not Progressing

## 2022-06-06 NOTE — Progress Notes (Signed)
Pt denies SI/HI/AVH and verbally agrees to approach staff if these become apparent or before harming themselves/others. Rates depression 6/10. Rates anxiety 7/10. Rates pain 5/10. Pt has been in and out of his room. Pt was crying at evening meds stating that his wife thinks he is cheating on her. Pt stated that there is so much going on in his life that why would he add cheating on top of that. Pt given a PRN to help with anxiety. Scheduled medications administered to pt, per MD orders. RN provided support and encouragement to pt. Q15 min safety checks implemented and continued. Pt safe on the unit. RN will continue to monitor and intervene as needed.  06/06/22 0806  Psych Admission Type (Psych Patients Only)  Admission Status Voluntary  Psychosocial Assessment  Patient Complaints Anxiety;Depression  Eye Contact Fair  Facial Expression Sad  Affect Anxious;Depressed;Sad  Speech Logical/coherent  Interaction Forwards little;Isolative  Motor Activity Slow  Appearance/Hygiene Poor hygiene  Behavior Characteristics Cooperative;Appropriate to situation;Anxious  Mood Depressed;Anxious;Sad  Thought Process  Coherency WDL  Content WDL  Delusions None reported or observed  Perception WDL  Hallucination None reported or observed  Judgment Limited  Confusion None  Danger to Self  Current suicidal ideation? Denies  Danger to Others  Danger to Others None reported or observed

## 2022-06-06 NOTE — Progress Notes (Signed)
[facility] MD Progress Note  06/06/2022 1:28 PM Guy Rodriguez  MRN:  [id number] Subjective: Follow-up for this 41 year old man with substance use problems and depression.  Patient states he is feeling much better today.  Not diaphoretic not sick to his stomach energy much better.  He has been up out of bed and says he has talked with his wife finally.  He says that she is wanting to get back together with him and in fact would like him to get out of the hospital so that they can find a place to stay together.  Patient denies suicidal ideation.  No evidence of acute psychosis. Principal Problem: MDD (major depressive disorder) Diagnosis: Principal Problem:   MDD (major depressive disorder) Active Problems:   ETOH abuse   Opiate use  Total Time spent with patient: 30 minutes  Past Psychiatric History: Past history of alcohol opiate abuse depression  Past Medical History:  Past Medical History:  Diagnosis Date   Cocaine abuse (HCC)    ETOH abuse    GERD (gastroesophageal reflux disease)    History of methicillin resistant staphylococcus aureus (MRSA)    History of participation in smoking cessation counseling    Hypertension    H/O IN 2016-PCP TOOK PT OFF BP MED DUE TO BP CONTROLLED    Past Surgical History:  Procedure Laterality Date   APPENDECTOMY     BAND HEMORRHOIDECTOMY     CARPAL TUNNEL RELEASE Right    clavicale surgery Left    SPINAL FUSION N/A 05/10/2022   VASECTOMY N/A 02/28/2017   Procedure: VASECTOMY;  Surgeon: Hildred Laser, MD;  Location: [facility] ORS;  Service: Urology;  Laterality: N/A;   VASECTOMY Bilateral 03/28/2017   Procedure: VASECTOMY;  Surgeon: Hildred Laser, MD;  Location: [facility] ORS;  Service: Urology;  Laterality: Bilateral;   Family History:  Family History  Problem Relation Age of Onset   Prostate cancer Neg Hx    Kidney cancer Neg Hx    Family Psychiatric  History: See previous Social History:  Social History   Substance and Sexual Activity   Alcohol Use Yes   Alcohol/week: 42.0 standard drinks of alcohol   Types: 42 Cans of beer per week   Comment: reports drinking between 6-8 beers daily in addition to a box of wine (4 bottles)  reporting may be exagerated     Social History   Substance and Sexual Activity  Drug Use Yes   Frequency: 2.0 times per week   Types: "Crack" cocaine, Marijuana    Social History   Socioeconomic History   Marital status: Married    Spouse name: Not on file   Number of children: 3   Years of education: Not on file   Highest education level: Not on file  Occupational History   Not on file  Tobacco Use   Smoking status: Every Day    Packs/day: 0.50    Years: 13.00    Total pack years: 6.50    Types: Cigarettes   Smokeless tobacco: Never  Vaping Use   Vaping Use: Never used  Substance and Sexual Activity   Alcohol use: Yes    Alcohol/week: 42.0 standard drinks of alcohol    Types: 42 Cans of beer per week    Comment: reports drinking between 6-8 beers daily in addition to a box of wine (4 bottles)  reporting may be exagerated   Drug use: Yes    Frequency: 2.0 times per week    Types: "Crack" cocaine, Marijuana  Sexual activity: Yes    Partners: Female  Other Topics Concern   Not on file  Social History Narrative   Not on file   Social Determinants of Health   Financial Resource Strain: Not on file  Food Insecurity: Food Insecurity Present (06/01/2022)   Hunger Vital Sign    Worried About Running Out of Food in the Last Year: Often true    Ran Out of Food in the Last Year: Often true  Transportation Needs: Unmet Transportation Needs (06/01/2022)   PRAPARE - Administrator, Civil Service (Medical): No    Lack of Transportation (Non-Medical): Yes  Physical Activity: Not on file  Stress: Not on file  Social Connections: Not on file   Additional Social History:                         Sleep: Fair  Appetite:  Fair  Current Medications: Current  Facility-Administered Medications  Medication Dose Route Frequency Provider Last Rate Last Admin   acetaminophen (TYLENOL) tablet 650 mg  650 mg Oral Q6H PRN Jearld Lesch, NP   650 mg at 06/06/22 0806   alum & mag hydroxide-simeth (MAALOX/MYLANTA) 200-200-20 MG/5ML suspension 30 mL  30 mL Oral Q4H PRN Jearld Lesch, NP       buprenorphine-naloxone (SUBOXONE) 8-2 mg per SL tablet 1 tablet  1 tablet Sublingual TID Daishia Fetterly, Jackquline Denmark, MD   1 tablet at 06/06/22 1141   folic acid (FOLVITE) tablet 1 mg  1 mg Oral Daily Beverly Sessions, MD   1 mg at 06/06/22 0805   hydrOXYzine (ATARAX) tablet 25 mg  25 mg Oral TID PRN Jearld Lesch, NP   25 mg at 06/05/22 2040   magnesium hydroxide (MILK OF MAGNESIA) suspension 30 mL  30 mL Oral Daily PRN Jearld Lesch, NP   30 mL at 06/05/22 0762   multivitamin with minerals tablet 1 tablet  1 tablet Oral Daily Beverly Sessions, MD   1 tablet at 06/06/22 2633   nicotine polacrilex (NICORETTE) gum 2 mg  2 mg Oral PRN Eilene Voigt, Jackquline Denmark, MD   2 mg at 06/06/22 1209   sertraline (ZOLOFT) tablet 50 mg  50 mg Oral Daily Beverly Sessions, MD   50 mg at 06/06/22 0805   thiamine (VITAMIN B1) tablet 100 mg  100 mg Oral Daily Beverly Sessions, MD   100 mg at 06/06/22 3545   Or   thiamine (VITAMIN B1) injection 100 mg  100 mg Intravenous Daily Beverly Sessions, MD       traZODone (DESYREL) tablet 50 mg  50 mg Oral QHS PRN Jearld Lesch, NP   50 mg at 06/05/22 2113    Lab Results: No results found for this or any previous visit (from the past 48 hour(s)).  Blood Alcohol level:  Lab Results  Component Value Date   ETH 49 (H) 06/01/2022   ETH 276 (H) 04/07/2022    Metabolic Disorder Labs: No results found for: "HGBA1C", "MPG" No results found for: "PROLACTIN" No results found for: "CHOL", "TRIG", "HDL", "CHOLHDL", "VLDL", "LDLCALC"  Physical Findings: AIMS: Facial and Oral Movements Muscles of Facial Expression: None, normal Lips and Perioral Area: None,  normal Jaw: None, normal Tongue: None, normal,Extremity Movements Upper (arms, wrists, hands, fingers): None, normal Lower (legs, knees, ankles, toes): None, normal, Trunk Movements Neck, shoulders, hips: None, normal, Overall Severity Severity of abnormal movements (highest score from questions above): None, normal Incapacitation due  to abnormal movements: None, normal Patient's awareness of abnormal movements (rate only patient's report): No Awareness, Dental Status Current problems with teeth and/or dentures?: No Does patient usually wear dentures?: No  CIWA:  CIWA-Ar Total: 0 COWS:     Musculoskeletal: Strength & Muscle Tone: within normal limits Gait & Station: normal Patient leans: Right  Psychiatric Specialty Exam:  Presentation  General Appearance:  Casual  Eye Contact: Fair  Speech: Clear and Coherent  Speech Volume: Normal  Handedness: Right   Mood and Affect  Mood: Depressed; Dysphoric; Hopeless; Worthless  Affect: Depressed; Flat; Tearful   Thought Process  Thought Processes: Coherent  Descriptions of Associations:Intact  Orientation:Full (Time, Place and Person)  Thought Content:Logical; WDL  History of Schizophrenia/Schizoaffective disorder:No  Duration of Psychotic Symptoms:No data recorded Hallucinations:No data recorded Ideas of Reference:None  Suicidal Thoughts:No data recorded Homicidal Thoughts:No data recorded  Sensorium  Memory: Immediate Fair  Judgment: Impaired  Insight: Poor   Executive Functions  Concentration: Poor  Attention Span: Fair  Recall: Fair  Fund of Knowledge: Poor  Language: Fair   Psychomotor Activity  Psychomotor Activity:No data recorded  Assets  Assets: Communication Skills   Sleep  Sleep:No data recorded   Physical Exam: Physical Exam Vitals and nursing note reviewed.  Constitutional:      Appearance: Normal appearance.  HENT:     Head: Normocephalic and  atraumatic.     Mouth/Throat:     Pharynx: Oropharynx is clear.  Eyes:     Pupils: Pupils are equal, round, and reactive to light.  Cardiovascular:     Rate and Rhythm: Normal rate and regular rhythm.  Pulmonary:     Effort: Pulmonary effort is normal.     Breath sounds: Normal breath sounds.  Abdominal:     General: Abdomen is flat.     Palpations: Abdomen is soft.  Musculoskeletal:        General: Normal range of motion.  Skin:    General: Skin is warm and dry.  Neurological:     General: No focal deficit present.     Mental Status: He is alert. Mental status is at baseline.  Psychiatric:        Mood and Affect: Mood normal.        Thought Content: Thought content normal.    Review of Systems  Constitutional: Negative.   HENT: Negative.    Eyes: Negative.   Respiratory: Negative.    Cardiovascular: Negative.   Gastrointestinal: Negative.   Musculoskeletal: Negative.   Skin: Negative.   Neurological: Negative.   Psychiatric/Behavioral: Negative.     Blood pressure 109/79, pulse 60, temperature 98.4 F (36.9 C), temperature source Oral, resp. rate 18, height 6\' 1"  (1.854 m), weight 75.8 kg, SpO2 97 %. Body mass index is 22.03 kg/m.   Treatment Plan Summary: Daily contact with patient to assess and evaluate symptoms and progress in treatment, Medication management, and Plan patient appears to be stabilizing in terms of acute symptoms.  For days I have been encouraging him to talk with social work about possible substance abuse rehab.  He seems to have only limited motivation in that regard and to be more motivated to try and get back together with his wife as soon as possible.  He is requesting discharge tomorrow.  We will go ahead and plan for likely discharge at that time with outpatient referral for mental health treatment.  , MD 06/06/2022, 1:28 PM

## 2022-06-06 NOTE — Progress Notes (Signed)
Recreation Therapy Notes   Date: 06/06/2022  Time: 10:45 am     Location: Craft room   Behavioral response: N/A   Intervention Topic: Stress Management   Discussion/Intervention: Patient refused to attend group.   Clinical Observations/Feedback:  Patient refused to attend group.    Moranda Billiot LRT/CTRS        Leeya Rusconi 06/06/2022 12:14 PM

## 2022-06-06 NOTE — [facility] Group Notes (Signed)
[facility] Group Notes:  (Nursing/MHT/Case Management/Adjunct)  Date:  06/06/2022  Time:  9:30 AM  Type of Therapy:   community meeting  Participation Level:  Did Not Attend    Guy Rodriguez 06/06/2022, 9:30 AM 

## 2022-06-06 NOTE — Plan of Care (Signed)
  Problem: Coping: Goal: Level of anxiety will decrease Outcome: Progressing   Problem: Education: Goal: Emotional status will improve Outcome: Progressing   

## 2022-06-06 NOTE — Plan of Care (Signed)
Pt endorses anxiety/depression at this time. Pt denies SI/HI/AVH however, experiencing 5/10 chronic lower back and bilat hip pain at this time, prn medication given. Pt is calm and cooperative. Pt is medication compliant. Pt provided with support and encouragement. Pt monitored q15 minutes for safety per unit policy. Plan of care ongoing.   Informed by MHT that pt was pulling food out of his coat that it was from lunch (such as a whole burger). Pt's room has food in it. Pt will clean room out from food in the morning directly after vital signs.  Pt was tearful during med administration. When asked what was causing him to cry and be anxious, pt reported he spoke with his girlfriend who informed him that she was at a trap house and she didn't want to be there but has no where to go.   Problem: Elimination: Goal: Will not experience complications related to bowel motility Outcome: Progressing   Problem: Coping: Goal: Level of anxiety will decrease Outcome: Not Progressing

## 2022-06-07 NOTE — Progress Notes (Signed)
Recreation Therapy Notes  INPATIENT RECREATION TR PLAN  Patient Details Name: Guy Rodriguez MRN: [id number] DOB: 03-24-1981 Today's Date: 06/07/2022  Rec Therapy Plan Is patient appropriate for Therapeutic Recreation?: Yes Treatment times per week: at least 3 Estimated Length of Stay: 5-7 days TR Treatment/Interventions: Group participation (Comment)  Discharge Criteria Pt will be discharged from therapy if:: Discharged Treatment plan/goals/alternatives discussed and agreed upon by:: Patient/family  Discharge Summary Short term goals set: Patient will engage in groups without prompting or encouragement from LRT x3 group sessions within 5 recreation therapy group sessions Short term goals met: Not met Progress toward goals comments: Other (Comment) (Patient did not attend any groups) Reason goals not met: Patient did not attendy any groups Therapeutic equipment acquired: N/A Reason patient discharged from therapy: Discharge from hospital Pt/family agrees with progress & goals achieved: Yes Date patient discharged from therapy: 06/07/22   [name] 06/07/2022, 1:45 PM

## 2022-06-07 NOTE — Progress Notes (Signed)
  [facility] Adult Case Management Discharge Plan :  Will you be returning to the same living situation after discharge:  Yes,  pt reports plans to go to the county shelter or stay in a hotel at discharge. At discharge, do you have transportation home?: Yes,  pt reports that a peer will provide transportation. Do you have the ability to pay for your medications: Yes,  [employer]  Release of information consent forms completed and in the chart;  Patient's signature needed at discharge.  Patient to Follow up at:  Follow-up Information     [facility] Follow up.   Why: Lorella Nimrod, peer support specialist, will be picking you up on Wednesday, 06/12/22 at 7AM for your appointment. Thanks! Contact information: [address] [place] [address] [phone]                 Next level of care provider has access to [facility] Link:no  Safety Planning and Suicide Prevention discussed: Yes,  SPE ompleted with the patient.  Patient declined collateral contact.     Has patient been referred to the Quitline?: Patient refused referral  Patient has been referred for addiction treatment: Pt. refused referral  Harden Mo, LCSW 06/07/2022, 10:19 AM

## 2022-06-07 NOTE — Plan of Care (Signed)
  Problem: Education: Goal: Knowledge of General Education information will improve Description: Including pain rating scale, medication(s)/side effects and non-pharmacologic comfort measures Outcome: Adequate for Discharge   Problem: Education: Goal: Knowledge of [place] General Education information/materials will improve Outcome: Adequate for Discharge

## 2022-06-07 NOTE — Plan of Care (Signed)
  Problem: Group Participation Goal: STG - Patient will engage in groups without prompting or encouragement from LRT x3 group sessions within 5 recreation therapy group sessions Description: STG - Patient will engage in groups without prompting or encouragement from LRT x3 group sessions within 5 recreation therapy group sessions 06/07/2022 1342 by Ernest Haber, LRT Outcome: Not Applicable [id number] by Ernest Haber, LRT Outcome: Not Met (add Reason) Note: Patient did not attend any groups.

## 2022-06-07 NOTE — Discharge Summary (Signed)
Physician Discharge Summary Note  Patient:  Guy Rodriguez is an 41 y.o., male MRN:  [id number] DOB:  03/30/1981 Patient phone:  [phone] (home)  Patient address:   [place] [address],  Total Time spent with patient: 30 minutes  Date of Admission:  06/01/2022 Date of Discharge: 06/07/2022  Reason for Admission: Patient was admitted after presenting with depression and suicidal ideation and substance use and withdrawal  Principal Problem: MDD (major depressive disorder) Discharge Diagnoses: Principal Problem:   MDD (major depressive disorder) Active Problems:   ETOH abuse   Opiate use   Past Psychiatric History: Patient has a history of longstanding substance use problems involving multiple substances including alcohol cocaine and opiates.  History of depression.  History of severe psychosocial difficulties related to both of the above.  Has had previous hospitalizations.  Past Medical History:  Past Medical History:  Diagnosis Date   Cocaine abuse (HCC)    ETOH abuse    GERD (gastroesophageal reflux disease)    History of methicillin resistant staphylococcus aureus (MRSA)    History of participation in smoking cessation counseling    Hypertension    H/O IN 2016-PCP TOOK PT OFF BP MED DUE TO BP CONTROLLED    Past Surgical History:  Procedure Laterality Date   APPENDECTOMY     BAND HEMORRHOIDECTOMY     CARPAL TUNNEL RELEASE Right    clavicale surgery Left    SPINAL FUSION N/A 05/10/2022   VASECTOMY N/A 02/28/2017   Procedure: VASECTOMY;  Surgeon: Hildred Laser, MD;  Location: [facility] ORS;  Service: Urology;  Laterality: N/A;   VASECTOMY Bilateral 03/28/2017   Procedure: VASECTOMY;  Surgeon: Hildred Laser, MD;  Location: [facility] ORS;  Service: Urology;  Laterality: Bilateral;   Family History:  Family History  Problem Relation Age of Onset   Prostate cancer Neg Hx    Kidney cancer Neg Hx    Family Psychiatric  History: None reported Social  History:  Social History   Substance and Sexual Activity  Alcohol Use Yes   Alcohol/week: 42.0 standard drinks of alcohol   Types: 42 Cans of beer per week   Comment: reports drinking between 6-8 beers daily in addition to a box of wine (4 bottles)  reporting may be exagerated     Social History   Substance and Sexual Activity  Drug Use Yes   Frequency: 2.0 times per week   Types: "Crack" cocaine, Marijuana    Social History   Socioeconomic History   Marital status: Married    Spouse name: Not on file   Number of children: 3   Years of education: Not on file   Highest education level: Not on file  Occupational History   Not on file  Tobacco Use   Smoking status: Every Day    Packs/day: 0.50    Years: 13.00    Total pack years: 6.50    Types: Cigarettes   Smokeless tobacco: Never  Vaping Use   Vaping Use: Never used  Substance and Sexual Activity   Alcohol use: Yes    Alcohol/week: 42.0 standard drinks of alcohol    Types: 42 Cans of beer per week    Comment: reports drinking between 6-8 beers daily in addition to a box of wine (4 bottles)  reporting may be exagerated   Drug use: Yes    Frequency: 2.0 times per week    Types: "Crack" cocaine, Marijuana   Sexual activity: Yes    Partners: Female  Other  Topics Concern   Not on file  Social History Narrative   Not on file   Social Determinants of Health   Financial Resource Strain: Not on file  Food Insecurity: Food Insecurity Present (06/01/2022)   Hunger Vital Sign    Worried About Running Out of Food in the Last Year: Often true    Ran Out of Food in the Last Year: Often true  Transportation Needs: Unmet Transportation Needs (06/01/2022)   PRAPARE - Administrator, Civil Service (Medical): No    Lack of Transportation (Non-Medical): Yes  Physical Activity: Not on file  Stress: Not on file  Social Connections: Not on file    Hospital Course: Patient admitted to psychiatric unit.  15-minute  checks continued.  Patient initially complained of physical symptoms consistent with both opiate and alcohol withdrawal.  Suboxone was initiated with good results alleviating the opiate withdrawal symptoms.  Continued on protocol of CIWA for treatment of alcohol withdrawal.  Did not have a seizure did not experience delirium.  Once through the worst of the alcohol withdrawal became more interactive.  Complains of depression but denies acute suicidal ideation.  Was started back on antidepressants.  Now the patient has made contact with his wife again and states that he wants to go live with her.  He was offered the option of considering rehab but prefers discharge to outpatient treatment.  He will be discharged and recommended to follow-up with [facility].  Prescription provided for Suboxone and antidepressants.  Physical Findings: AIMS: Facial and Oral Movements Muscles of Facial Expression: None, normal Lips and Perioral Area: None, normal Jaw: None, normal Tongue: None, normal,Extremity Movements Upper (arms, wrists, hands, fingers): None, normal Lower (legs, knees, ankles, toes): None, normal, Trunk Movements Neck, shoulders, hips: None, normal, Overall Severity Severity of abnormal movements (highest score from questions above): None, normal Incapacitation due to abnormal movements: None, normal Patient's awareness of abnormal movements (rate only patient's report): No Awareness, Dental Status Current problems with teeth and/or dentures?: No Does patient usually wear dentures?: No  CIWA:  CIWA-Ar Total: 0 COWS:     Musculoskeletal: Strength & Muscle Tone: within normal limits Gait & Station: normal Patient leans: N/A   Psychiatric Specialty Exam:  Presentation  General Appearance:  Casual  Eye Contact: Fair  Speech: Clear and Coherent  Speech Volume: Normal  Handedness: Right   Mood and Affect  Mood: Depressed; Dysphoric; Hopeless; Worthless  Affect: Depressed; Flat;  Tearful   Thought Process  Thought Processes: Coherent  Descriptions of Associations:Intact  Orientation:Full (Time, Place and Person)  Thought Content:Logical; WDL  History of Schizophrenia/Schizoaffective disorder:No  Duration of Psychotic Symptoms:No data recorded Hallucinations:No data recorded Ideas of Reference:None  Suicidal Thoughts:No data recorded Homicidal Thoughts:No data recorded  Sensorium  Memory: Immediate Fair  Judgment: Impaired  Insight: Poor   Executive Functions  Concentration: Poor  Attention Span: Fair  Recall: Fair  Fund of Knowledge: Poor  Language: Fair   Psychomotor Activity  Psychomotor Activity:No data recorded  Assets  Assets: Communication Skills   Sleep  Sleep:No data recorded   Physical Exam: Physical Exam Vitals and nursing note reviewed.  Constitutional:      Appearance: Normal appearance.  HENT:     Head: Normocephalic and atraumatic.     Mouth/Throat:     Pharynx: Oropharynx is clear.  Eyes:     Pupils: Pupils are equal, round, and reactive to light.  Cardiovascular:     Rate and Rhythm: Normal rate and regular  rhythm.  Pulmonary:     Effort: Pulmonary effort is normal.     Breath sounds: Normal breath sounds.  Abdominal:     General: Abdomen is flat.     Palpations: Abdomen is soft.  Musculoskeletal:        General: Normal range of motion.  Skin:    General: Skin is warm and dry.  Neurological:     General: No focal deficit present.     Mental Status: He is alert. Mental status is at baseline.  Psychiatric:        Attention and Perception: Attention normal.        Mood and Affect: Mood normal.        Speech: Speech normal.        Behavior: Behavior normal.        Thought Content: Thought content normal.        Cognition and Memory: Cognition normal.    Review of Systems  Constitutional: Negative.   HENT: Negative.    Eyes: Negative.   Respiratory: Negative.    Cardiovascular:  Negative.   Gastrointestinal: Negative.   Musculoskeletal: Negative.   Skin: Negative.   Neurological: Negative.   Psychiatric/Behavioral: Negative.     Blood pressure (!) 140/93, pulse 61, temperature 97.9 F (36.6 C), temperature source Oral, resp. rate 20, height 6\' 1"  (1.854 m), weight 75.8 kg, SpO2 100 %. Body mass index is 22.03 kg/m.   Social History   Tobacco Use  Smoking Status Every Day   Packs/day: 0.50   Years: 13.00   Total pack years: 6.50   Types: Cigarettes  Smokeless Tobacco Never   Tobacco Cessation:  A prescription for an FDA-approved tobacco cessation medication provided at discharge   Blood Alcohol level:  Lab Results  Component Value Date   ETH 49 (H) 06/01/2022   ETH 276 (H) 04/07/2022    Metabolic Disorder Labs:  No results found for: "HGBA1C", "MPG" No results found for: "PROLACTIN" No results found for: "CHOL", "TRIG", "HDL", "CHOLHDL", "VLDL", "LDLCALC"  See Psychiatric Specialty Exam and Suicide Risk Assessment completed by Attending Physician prior to discharge.  Discharge destination:  Home  Is patient on multiple antipsychotic therapies at discharge:  No   Has Patient had three or more failed trials of antipsychotic monotherapy by history:  No  Recommended Plan for Multiple Antipsychotic Therapies: NA  Discharge Instructions     Diet - low sodium heart healthy   Complete by: As directed    Increase activity slowly   Complete by: As directed       Allergies as of 06/07/2022       Reactions   Demerol Hcl [meperidine] Other (See Comments)   Unknown reaction   Penicillins Hives   Patient doesn't remember to have an allergic reaction to Penicillin        Medication List     STOP taking these medications    oxyCODONE 5 MG immediate release tablet Commonly known as: Oxy IR/ROXICODONE       TAKE these medications      Indication  buprenorphine-naloxone 8-2 mg Subl SL tablet Commonly known as: SUBOXONE Place 1  tablet under the tongue 3 (three) times daily.  Indication: Opioid Dependence   hydrOXYzine 25 MG tablet Commonly known as: ATARAX Take 1 tablet (25 mg total) by mouth 3 (three) times daily as needed for anxiety.  Indication: Feeling Anxious   nicotine polacrilex 2 MG gum Commonly known as: NICORETTE Take 1 each (2 mg total)  by mouth as needed for smoking cessation.  Indication: Nicotine Addiction   sertraline 50 MG tablet Commonly known as: ZOLOFT Take 1 tablet (50 mg total) by mouth daily.  Indication: Major Depressive Disorder   traZODone 50 MG tablet Commonly known as: DESYREL Take 1 tablet (50 mg total) by mouth at bedtime as needed for sleep.  Indication: Trouble Sleeping        Follow-up Information     [facility] Follow up.   Why: Lorella NimrodHarvey, peer support specialist, will be picking you up on Wednesday, 06/12/22 at 7AM for your appointment. Thanks! Contact information: [address] [place] [id number] [phone]                 Follow-up recommendations:  Other:  Recommend follow-up with [facility] and substance abuse treatment.  Prescriptions and supply of medicines given.  Comments: See above  Signed: Mordecai RasmussenJohn Antwaun Buth, MD 06/07/2022, 10:53 AM

## 2022-06-07 NOTE — Progress Notes (Signed)
Discharge note: SSP completed and survey completed. RN met with pt and reviewed pt's discharge instructions. Pt verbalized understanding of discharge instructions and pt did not have any questions. RN reviewed and provided pt with a copy of SRA, AVS and Transition Record. RN returned pt's belongings to pt. Prescriptions and samples were given to pt. Pt denied SI/HI/AVH and voiced no concerns. Pt was appreciative of the care pt received at [facility]. Patient discharged to their ride without incident.  06/07/22 0810  Psych Admission Type (Psych Patients Only)  Admission Status Voluntary  Psychosocial Assessment  Patient Complaints None  Eye Contact Fair  Facial Expression Sad  Affect Sad  Speech Logical/coherent  Interaction Assertive  Motor Activity Other (Comment) (WDL)  Appearance/Hygiene Improved  Behavior Characteristics Cooperative;Appropriate to situation;Calm  Mood Depressed;Anxious;Sad  Thought Process  Coherency WDL  Content WDL  Delusions None reported or observed  Perception WDL  Hallucination None reported or observed  Judgment Limited  Confusion None  Danger to Self  Current suicidal ideation? Denies  Danger to Others  Danger to Others None reported or observed

## 2022-06-18 ENCOUNTER — Other Ambulatory Visit: Payer: Self-pay

## 2022-07-30 ENCOUNTER — Other Ambulatory Visit (INDEPENDENT_AMBULATORY_CARE_PROVIDER_SITE_OTHER): Payer: Self-pay | Admitting: Nurse Practitioner

## 2022-07-30 DIAGNOSIS — I8001 Phlebitis and thrombophlebitis of superficial vessels of right lower extremity: Secondary | ICD-10-CM

## 2022-07-30 DIAGNOSIS — I83813 Varicose veins of bilateral lower extremities with pain: Secondary | ICD-10-CM

## 2022-08-02 ENCOUNTER — Ambulatory Visit (INDEPENDENT_AMBULATORY_CARE_PROVIDER_SITE_OTHER): Payer: Medicaid Other | Admitting: Nurse Practitioner

## 2022-08-02 ENCOUNTER — Ambulatory Visit (INDEPENDENT_AMBULATORY_CARE_PROVIDER_SITE_OTHER): Payer: Medicaid Other

## 2022-08-02 ENCOUNTER — Encounter (INDEPENDENT_AMBULATORY_CARE_PROVIDER_SITE_OTHER): Payer: Self-pay | Admitting: Nurse Practitioner

## 2022-08-02 VITALS — BP 128/89 | HR 69 | Resp 16 | Ht 73.0 in | Wt 177.0 lb

## 2022-08-02 DIAGNOSIS — I83813 Varicose veins of bilateral lower extremities with pain: Secondary | ICD-10-CM

## 2022-08-02 DIAGNOSIS — I8001 Phlebitis and thrombophlebitis of superficial vessels of right lower extremity: Secondary | ICD-10-CM | POA: Diagnosis not present

## 2022-08-02 DIAGNOSIS — I10 Essential (primary) hypertension: Secondary | ICD-10-CM
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
## 2022-09-10 ENCOUNTER — Telehealth (INDEPENDENT_AMBULATORY_CARE_PROVIDER_SITE_OTHER): Payer: Self-pay | Admitting: Nurse Practitioner

## 2022-09-10 NOTE — Telephone Encounter (Signed)
Returned call to patient regarding the auth and scheduling of laser ablation procedure. I explained the back log of patients that I have. I advised that I would be calling as soon as I get to him on the list. Pt was agreeable. I will follow up with pt as soon as I get the auth.

## 2022-10-24 ENCOUNTER — Emergency Department
Admission: EM | Admit: 2022-10-24 | Discharge: 2022-10-25 | Disposition: A | Discharge status: Rehabilitation Facility | Payer: No Typology Code available for payment source | Attending: Emergency Medicine | Admitting: Emergency Medicine

## 2022-10-24 ENCOUNTER — Other Ambulatory Visit: Payer: Self-pay

## 2022-10-24 ENCOUNTER — Encounter: Payer: Self-pay | Admitting: Intensive Care

## 2022-10-24 DIAGNOSIS — Z789 Other specified health status: Secondary | ICD-10-CM

## 2022-10-24 DIAGNOSIS — F1014 Alcohol abuse with alcohol-induced mood disorder: Secondary | ICD-10-CM | POA: Diagnosis present

## 2022-10-24 DIAGNOSIS — F32A Depression, unspecified: Secondary | ICD-10-CM | POA: Insufficient documentation

## 2022-10-24 DIAGNOSIS — Y908 Blood alcohol level of 240 mg/100 ml or more: Secondary | ICD-10-CM | POA: Insufficient documentation

## 2022-10-24 DIAGNOSIS — F17210 Nicotine dependence, cigarettes, uncomplicated: Secondary | ICD-10-CM | POA: Insufficient documentation

## 2022-10-24 DIAGNOSIS — I10 Essential (primary) hypertension: Secondary | ICD-10-CM | POA: Insufficient documentation

## 2022-10-24 LAB — COMPREHENSIVE METABOLIC PANEL
ALT: 30 U/L (ref 0–44)
AST: 32 U/L (ref 15–41)
Albumin: 4.1 g/dL (ref 3.5–5.0)
Alkaline Phosphatase: 77 U/L (ref 38–126)
Anion gap: 10 (ref 5–15)
BUN: 17 mg/dL (ref 6–20)
CO2: 25 mmol/L (ref 22–32)
Calcium: 8.7 mg/dL — ABNORMAL LOW (ref 8.9–10.3)
Chloride: 106 mmol/L (ref 98–111)
Creatinine, Ser: 0.83 mg/dL (ref 0.61–1.24)
GFR, Estimated: >60 mL/min (ref >60)
Glucose, Bld: 113 mg/dL — ABNORMAL HIGH (ref 70–99)
Potassium: 3.6 mmol/L (ref 3.5–5.1)
Sodium: 141 mmol/L (ref 135–145)
Total Bilirubin: 0.9 mg/dL (ref 0.3–1.2)
Total Protein: 7.1 g/dL (ref 6.5–8.1)

## 2022-10-24 LAB — CBC
HCT: 46.6 % (ref 39.0–52.0)
Hemoglobin: 15.6 g/dL (ref 13.0–17.0)
MCH: 31.6 pg (ref 26.0–34.0)
MCHC: 33.5 g/dL (ref 30.0–36.0)
MCV: 94.5 fL (ref 80.0–100.0)
Platelets: 299 10*3/uL (ref 150–400)
RBC: 4.93 MIL/uL (ref 4.22–5.81)
RDW: 14.6 % (ref 11.5–15.5)
WBC: 7 10*3/uL (ref 4.0–10.5)
nRBC: 0 % (ref 0.0–0.2)

## 2022-10-24 LAB — URINE DRUG SCREEN, QUALITATIVE (ARMC ONLY)
Amphetamines, Ur Screen: NOT DETECTED
Barbiturates, Ur Screen: NOT DETECTED
Benzodiazepine, Ur Scrn: NOT DETECTED
Cannabinoid 50 Ng, Ur ~~LOC~~: POSITIVE — AB
Cocaine Metabolite,Ur ~~LOC~~: POSITIVE — AB
MDMA (Ecstasy)Ur Screen: NOT DETECTED
Methadone Scn, Ur: NOT DETECTED
Opiate, Ur Screen: NOT DETECTED
Phencyclidine (PCP) Ur S: NOT DETECTED
Tricyclic, Ur Screen: NOT DETECTED

## 2022-10-24 LAB — ACETAMINOPHEN LEVEL: Acetaminophen (Tylenol), Serum: <10 ug/mL — ABNORMAL LOW (ref 10–30)

## 2022-10-24 LAB — ETHANOL: Alcohol, Ethyl (B): 255 mg/dL — ABNORMAL HIGH (ref <10)

## 2022-10-24 LAB — SALICYLATE LEVEL: Salicylate Lvl: <7 mg/dL — ABNORMAL LOW (ref 7.0–30.0)
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
## 2022-10-24 NOTE — ED Notes (Signed)
This tech obtained vitals on pt.  
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
## 2022-10-24 NOTE — ED Notes (Signed)
Md in with pt.   

## 2022-10-25 DIAGNOSIS — F1014 Alcohol abuse with alcohol-induced mood disorder: Secondary | ICD-10-CM | POA: Diagnosis not present

## 2022-10-25 MED ORDER — LORAZEPAM 1 MG PO TABS
1.0000 mg | ORAL_TABLET | Freq: Four times a day (QID) | ORAL | Status: DC
  Administered 2022-10-25 (×3): 1 mg via ORAL
  Filled 2022-10-25 (×3): qty 1

## 2022-10-25 MED ORDER — HYDROXYZINE HCL 25 MG PO TABS
25.0000 mg | ORAL_TABLET | Freq: Four times a day (QID) | ORAL | Status: DC | PRN
  Administered 2022-10-25: 25 mg via ORAL
  Filled 2022-10-25: qty 1

## 2022-10-25 MED ORDER — ONDANSETRON 4 MG PO TBDP: 4.0000 mg | ORAL_TABLET | Freq: Four times a day (QID) | ORAL | Status: DC | PRN

## 2022-10-25 MED ORDER — LORAZEPAM 1 MG PO TABS: 1.0000 mg | ORAL_TABLET | Freq: Four times a day (QID) | ORAL | Status: DC | PRN

## 2022-10-25 MED ORDER — GABAPENTIN 300 MG PO CAPS
300.0000 mg | ORAL_CAPSULE | Freq: Three times a day (TID) | ORAL | Status: DC
  Administered 2022-10-25 (×2): 300 mg via ORAL
  Filled 2022-10-25 (×2): qty 1

## 2022-10-25 MED ORDER — ADULT MULTIVITAMIN W/MINERALS CH
1.0000 | ORAL_TABLET | Freq: Every day | ORAL | Status: DC
  Administered 2022-10-25: 1 via ORAL
  Filled 2022-10-25: qty 1

## 2022-10-25 MED ORDER — THIAMINE HCL 100 MG/ML IJ SOLN
100.0000 mg | Freq: Once | INTRAMUSCULAR | Status: AC
  Administered 2022-10-25: 100 mg via INTRAMUSCULAR
  Filled 2022-10-25: qty 2

## 2022-10-25 MED ORDER — LORAZEPAM 1 MG PO TABS: 1.0000 mg | ORAL_TABLET | Freq: Three times a day (TID) | ORAL | Status: DC

## 2022-10-25 MED ORDER — LORAZEPAM 1 MG PO TABS: 1.0000 mg | ORAL_TABLET | Freq: Every day | ORAL | Status: DC

## 2022-10-25 MED ORDER — THIAMINE MONONITRATE 100 MG PO TABS: 100.0000 mg | ORAL_TABLET | Freq: Every day | ORAL | Status: DC

## 2022-10-25 MED ORDER — LORAZEPAM 1 MG PO TABS: 1.0000 mg | ORAL_TABLET | Freq: Two times a day (BID) | ORAL | Status: DC

## 2022-10-25 NOTE — BH Assessment (Signed)
Referral information for Detox and SA Treatment faxed to;   [facility] [phone])  Earlene Plater [phone]),  [facility] [phone]) [phone] crisis center/detox facility) (Fax#-[phone]-or-[phone])  [place] [phone]--- [phone]--- [phone]--- [phone])  [facility] [phone]),   [facility] [phone])  [facility] [phone]).  [facility] [phone]).

## 2022-10-25 NOTE — ED Notes (Signed)
Notified facility, Archie Patten, that patient is on his way via Psychologist, educational.

## 2022-10-25 NOTE — ED Notes (Signed)
VOL/Psych Consult completed/Rec Rehab by NP

## 2022-10-25 NOTE — BH Assessment (Signed)
Received phone call from [facility] ([phone]), they have a bed available for the patient, for today.  ER Staff is aware of it:  Rivka Barbara, ER Secretary  Illene Labrador, Patient's Nurse  Address: [facility] [address]  [place], [place] [address] [phone] (Main Office)

## 2022-10-25 NOTE — Consult Note (Signed)
[facility] Face-to-Face Psychiatry Consult   Reason for Consult:  alcohol intoxication Referring Physician:  EDP Patient Identification: Guy Rodriguez MRN:  [id number] Principal Diagnosis: Alcohol abuse with alcohol-induced mood disorder Diagnosis:  Principal Problem:   Alcohol abuse with alcohol-induced mood disorder   Total Time spent with patient: 45 minutes  Subjective:   Guy Rodriguez is a 42 y.o. male patient admitted with alcohol intoxication requesting rehab.  HPI:  42 yo male who presented to the ED under the influence of alcohol.  Today, he is clear and coherent just returning from his shower.  He is experiencing tremors from alcohol withdrawal, Ativan detox protocol started along with gabapentin 300 mg TID.  He reported drinking 6-7 bottles of wine daily, denies withdrawal seizures.  Cocaine in the form of crack used every other day.  He is tired of living with substances and wants to stop.  Rehab "a long time ago".  Denies suicidal/homicidal ideations and hallucinations.  Past Psychiatric History: polysubstance use disorder, including opiates  Risk to Self:  none Risk to Others:  none Prior Inpatient Therapy:  rehabs Prior Outpatient Therapy:  none  Past Medical History:  Past Medical History:  Diagnosis Date   Cocaine abuse    ETOH abuse    GERD (gastroesophageal reflux disease)    History of methicillin resistant staphylococcus aureus (MRSA)    History of participation in smoking cessation counseling    Hypertension    H/O IN 2016-PCP TOOK PT OFF BP MED DUE TO BP CONTROLLED    Past Surgical History:  Procedure Laterality Date   APPENDECTOMY     BAND HEMORRHOIDECTOMY     CARPAL TUNNEL RELEASE Right    clavicale surgery Left    SPINAL FUSION N/A 05/10/2022   VASECTOMY N/A 02/28/2017   Procedure: VASECTOMY;  Surgeon: Hildred Laser, MD;  Location: [facility] ORS;  Service: Urology;  Laterality: N/A;   VASECTOMY Bilateral 03/28/2017   Procedure: VASECTOMY;  Surgeon:  Hildred Laser, MD;  Location: [facility] ORS;  Service: Urology;  Laterality: Bilateral;   Family History:  Family History  Problem Relation Age of Onset   Prostate cancer Neg Hx    Kidney cancer Neg Hx    Family Psychiatric  History: none Social History:  Social History   Substance and Sexual Activity  Alcohol Use Yes   Alcohol/week: 42.0 standard drinks of alcohol   Types: 42 Cans of beer per week   Comment: reports drinking between 6-8 beers daily in addition to a box of wine (4 bottles)  reporting may be exagerated     Social History   Substance and Sexual Activity  Drug Use Yes   Frequency: 2.0 times per week   Types: "Crack" cocaine, Marijuana    Social History   Socioeconomic History   Marital status: Married    Spouse name: Not on file   Number of children: 3   Years of education: Not on file   Highest education level: Not on file  Occupational History   Not on file  Tobacco Use   Smoking status: Every Day    Packs/day: 0.50    Years: 13.00    Additional pack years: 0.00    Total pack years: 6.50    Types: Cigarettes   Smokeless tobacco: Never  Vaping Use   Vaping Use: Never used  Substance and Sexual Activity   Alcohol use: Yes    Alcohol/week: 42.0 standard drinks of alcohol    Types: 42 Cans of beer per  week    Comment: reports drinking between 6-8 beers daily in addition to a box of wine (4 bottles)  reporting may be exagerated   Drug use: Yes    Frequency: 2.0 times per week    Types: "Crack" cocaine, Marijuana   Sexual activity: Yes    Partners: Female  Other Topics Concern   Not on file  Social History Narrative   Not on file   Social Determinants of Health   Financial Resource Strain: Not on file  Food Insecurity: Food Insecurity Present (06/01/2022)   Hunger Vital Sign    Worried About Running Out of Food in the Last Year: Often true    Ran Out of Food in the Last Year: Often true  Transportation Needs: Unmet Transportation Needs  (06/01/2022)   PRAPARE - Administrator, Civil Service (Medical): No    Lack of Transportation (Non-Medical): Yes  Physical Activity: Not on file  Stress: Not on file  Social Connections: Not on file   Additional Social History:    Allergies:   Allergies  Allergen Reactions   Demerol Hcl [Meperidine] Other (See Comments)    Unknown reaction   Penicillins Hives    Patient doesn't remember to have an allergic reaction to Penicillin    Labs:  Results for orders placed or performed during the hospital encounter of 10/24/22 (from the past 48 hour(s))  Comprehensive metabolic panel     Status: Abnormal   Collection Time: 10/24/22  6:44 PM  Result Value Ref Range   Sodium 141 135 - 145 mmol/L   Potassium 3.6 3.5 - 5.1 mmol/L   Chloride 106 98 - 111 mmol/L   CO2 25 22 - 32 mmol/L   Glucose, Bld 113 (H) 70 - 99 mg/dL    Comment: Glucose reference range applies only to samples taken after fasting for at least 8 hours.   BUN 17 6 - 20 mg/dL   Creatinine, Ser 1.61 0.61 - 1.24 mg/dL   Calcium 8.7 (L) 8.9 - 10.3 mg/dL   Total Protein 7.1 6.5 - 8.1 g/dL   Albumin 4.1 3.5 - 5.0 g/dL   AST 32 15 - 41 U/L   ALT 30 0 - 44 U/L   Alkaline Phosphatase 77 38 - 126 U/L   Total Bilirubin 0.9 0.3 - 1.2 mg/dL   GFR, Estimated >09 >60 mL/min    Comment: (NOTE) Calculated using the CKD-EPI Creatinine Equation (2021)    Anion gap 10 5 - 15    Comment: Performed at [facility], [address] [place], [place] [address]  Ethanol     Status: Abnormal   Collection Time: 10/24/22  6:44 PM  Result Value Ref Range   Alcohol, Ethyl (B) 255 (H) <10 mg/dL    Comment: (NOTE) Lowest detectable limit for serum alcohol is 10 mg/dL.  For medical purposes only. Performed at [facility], [address] [place], [place] [address]   Salicylate level     Status: Abnormal   Collection Time: 10/24/22  6:44 PM  Result Value Ref Range   Salicylate Lvl <7.0 (L) 7.0 - 30.0  mg/dL    Comment: Performed at [facility], [address] [place], [place] [address]  Acetaminophen level     Status: Abnormal   Collection Time: 10/24/22  6:44 PM  Result Value Ref Range   Acetaminophen (Tylenol), Serum <10 (L) 10 - 30 ug/mL    Comment: (NOTE) Therapeutic concentrations vary significantly. A range  of 10-30 ug/mL  may be an effective concentration for many patients. However, some  are best treated at concentrations outside of this range. Acetaminophen concentrations >150 ug/mL at 4 hours after ingestion  and >50 ug/mL at 12 hours after ingestion are often associated with  toxic reactions.  Performed at [facility], [address] [place], [place] [address]   cbc     Status: None   Collection Time: 10/24/22  6:44 PM  Result Value Ref Range   WBC 7.0 4.0 - 10.5 K/uL   RBC 4.93 4.22 - 5.81 MIL/uL   Hemoglobin 15.6 13.0 - 17.0 g/dL   HCT 60.4 54.0 - 98.1 %   MCV 94.5 80.0 - 100.0 fL   MCH 31.6 26.0 - 34.0 pg   MCHC 33.5 30.0 - 36.0 g/dL   RDW 19.1 47.8 - 29.5 %   Platelets 299 150 - 400 K/uL   nRBC 0.0 0.0 - 0.2 %    Comment: Performed at [facility], [address] [place], [place] [address]  Urine Drug Screen, Qualitative     Status: Abnormal   Collection Time: 10/24/22  8:10 PM  Result Value Ref Range   Tricyclic, Ur Screen NONE DETECTED NONE DETECTED   Amphetamines, Ur Screen NONE DETECTED NONE DETECTED   MDMA (Ecstasy)Ur Screen NONE DETECTED NONE DETECTED   Cocaine Metabolite,Ur [place] POSITIVE (A) NONE DETECTED   Opiate, Ur Screen NONE DETECTED NONE DETECTED   Phencyclidine (PCP) Ur S NONE DETECTED NONE DETECTED   Cannabinoid 50 Ng, Ur  POSITIVE (A) NONE DETECTED   Barbiturates, Ur Screen NONE DETECTED NONE DETECTED   Benzodiazepine, Ur Scrn NONE DETECTED NONE DETECTED   Methadone Scn, Ur NONE DETECTED NONE DETECTED    Comment: (NOTE) Tricyclics + metabolites, urine    Cutoff 1000 ng/mL Amphetamines + metabolites,  urine  Cutoff 1000 ng/mL MDMA (Ecstasy), urine              Cutoff 500 ng/mL Cocaine Metabolite, urine          Cutoff 300 ng/mL Opiate + metabolites, urine        Cutoff 300 ng/mL Phencyclidine (PCP), urine         Cutoff 25 ng/mL Cannabinoid, urine                 Cutoff 50 ng/mL Barbiturates + metabolites, urine  Cutoff 200 ng/mL Benzodiazepine, urine              Cutoff 200 ng/mL Methadone, urine                   Cutoff 300 ng/mL  The urine drug screen provides only a preliminary, unconfirmed analytical test result and should not be used for non-medical purposes. Clinical consideration and professional judgment should be applied to any positive drug screen result due to possible interfering substances. A more specific alternate chemical method must be used in order to obtain a confirmed analytical result. Gas chromatography / mass spectrometry (GC/MS) is the preferred confirm atory method. Performed at [facility], [address] [place], [place] [address]     No current facility-administered medications for this encounter.   Current Outpatient Medications  Medication Sig Dispense Refill   buprenorphine-naloxone (SUBOXONE) 8-2 mg SUBL SL tablet Place 1 tablet under the tongue 3 (three) times daily. 90 tablet 0   hydrOXYzine (ATARAX) 25 MG tablet Take 1 tablet (25 mg total) by mouth 3 (three) times daily as needed for anxiety. (  Patient not taking: Reported on 08/02/2022) 30 tablet 0   nicotine polacrilex (NICORETTE) 2 MG gum Take 1 each (2 mg total) by mouth as needed for smoking cessation. (Patient not taking: Reported on 08/02/2022) 110 tablet 0   sertraline (ZOLOFT) 50 MG tablet Take 1 tablet (50 mg total) by mouth daily. (Patient not taking: Reported on 08/02/2022) 30 tablet 0   SUBOXONE 8-2 MG FILM Place under the tongue 3 (three) times daily.     traZODone (DESYREL) 50 MG tablet Take 1 tablet (50 mg total) by mouth at bedtime as needed for sleep. (Patient not  taking: Reported on 08/02/2022) 30 tablet 0    Musculoskeletal: Strength & Muscle Tone: within normal limits Gait & Station: normal Patient leans: N/A  Psychiatric Specialty Exam: Physical Exam Vitals and nursing note reviewed.  Constitutional:      Appearance: Normal appearance.  HENT:     Head: Normocephalic.     Nose: Nose normal.  Pulmonary:     Effort: Pulmonary effort is normal.  Musculoskeletal:        General: Normal range of motion.     Cervical back: Normal range of motion.  Neurological:     General: No focal deficit present.     Mental Status: He is alert and oriented to person, place, and time.  Psychiatric:        Attention and Perception: Attention and perception normal.        Mood and Affect: Mood is anxious and depressed.        Speech: Speech normal.        Behavior: Behavior normal. Behavior is cooperative.        Thought Content: Thought content normal.        Cognition and Memory: Cognition and memory normal.        Judgment: Judgment normal.     Review of Systems  Psychiatric/Behavioral:  Positive for depression and substance abuse. The patient is nervous/anxious.   All other systems reviewed and are negative.   Blood pressure (!) 145/92, pulse 81, temperature 98.2 F (36.8 C), temperature source Oral, resp. rate 18, height  (1.854 m), weight 72.6 kg, SpO2 96 %.Body mass index is 21.11 kg/m.  General Appearance: Casual  Eye Contact:  Good  Speech:  Normal Rate  Volume:  Normal  Mood:  Anxious and Depressed  Affect:  Congruent  Thought Process:  Coherent  Orientation:  Full (Time, Place, and Person)  Thought Content:  WDL and Logical  Suicidal Thoughts:  No  Homicidal Thoughts:  No  Memory:  Immediate;   Good Recent;   Good Remote;   Good  Judgement:  Fair  Insight:  Fair  Psychomotor Activity:  Normal  Concentration:  Concentration: Good and Attention Span: Good  Recall:  Good  Fund of Knowledge:  Fair  Language:  Good   Akathisia:  No  Handed:  Right  AIMS (if indicated):     Assets:  Leisure Time Physical Health Resilience  ADL's:  Intact  Cognition:  WNL  Sleep:        Physical Exam: Physical Exam Vitals and nursing note reviewed.  Constitutional:      Appearance: Normal appearance.  HENT:     Head: Normocephalic.     Nose: Nose normal.  Pulmonary:     Effort: Pulmonary effort is normal.  Musculoskeletal:        General: Normal range of motion.     Cervical back: Normal range of motion.  Neurological:     General: No focal deficit present.     Mental Status: He is alert and oriented to person, place, and time.  Psychiatric:        Attention and Perception: Attention and perception normal.        Mood and Affect: Mood is anxious and depressed.        Speech: Speech normal.        Behavior: Behavior normal. Behavior is cooperative.        Thought Content: Thought content normal.        Cognition and Memory: Cognition and memory normal.        Judgment: Judgment normal.    Review of Systems  Psychiatric/Behavioral:  Positive for depression and substance abuse. The patient is nervous/anxious.   All other systems reviewed and are negative.  Blood pressure (!) 145/92, pulse 81, temperature 98.2 F (36.8 C), temperature source Oral, resp. rate 18, height 6\' 1"  (1.854 m), weight 72.6 kg, SpO2 96 %. Body mass index is 21.11 kg/m.  Treatment Plan Summary: Alcohol abuse with alcohol induced mood disorder: Ativan detox protocol Rehab recommended  Disposition: No evidence of imminent risk to self or others at present.   Patient does not meet criteria for psychiatric inpatient admission. Supportive therapy provided about ongoing stressors.  Nanine Means, NP 10/25/2022 10:14 AM

## 2022-10-25 NOTE — ED Notes (Signed)
Patient tearful stating stating he would be glad when this is all over. Prn atarax given for anxiety. Complained of tremors and denies all other symptoms of withdrawals. Patient given the phone to call mother in law.

## 2022-10-25 NOTE — ED Notes (Signed)
Report called to [facility]. Per [facility] patient has a bed until 10pm. Information communicated to patient. Safe transport form filled out. Patient agrees to dispo plan. Safe transport to arrive after 7pm.

## 2022-10-25 NOTE — ED Provider Notes (Signed)
Emergency Medicine Observation Re-evaluation Note  Yitzhak Awan is a 42 y.o. male, seen on rounds today.  Pt initially presented to the ED for complaints of Mental Health Problem  Currently, the patient is is no acute distress. Denies any concerns at this time.  Physical Exam  Blood pressure 131/89, pulse 79, temperature 98.7 F (37.1 C), temperature source Oral, resp. rate 20, height  (1.854 m), weight 72.6 kg, SpO2 95 %.  Physical Exam: General: No apparent distress Pulm: Normal WOB Neuro: Moving all extremities Psych: Resting comfortably     ED Course / MDM     I have reviewed the labs performed to date as well as medications administered while in observation.  Recent changes in the last 24 hours include: No acute events overnight.  Plan   Current plan: Patient awaiting psychiatric disposition. Patient is not under full IVC at this time.    Demetrios Byron, Layla Maw, DO 10/25/22 [phone]

## 2022-10-25 NOTE — ED Notes (Signed)
Pt. Alert and oriented, warm and dry, in no distress. Pt. Denies SI, HI, and AVH. Patient tearful after speaking with spouse. Accepted PO medications. Resting in bed with eyes closed at this time. Pt. Encouraged to let nursing staff know of any concerns or needs.

## 2022-10-25 NOTE — ED Notes (Signed)
Safe transport will pick up after 7pm

## 2022-11-14 DIAGNOSIS — Z79899 Other long term (current) drug therapy: Secondary | ICD-10-CM | POA: Diagnosis not present

## 2022-11-14 DIAGNOSIS — K2970 Gastritis, unspecified, without bleeding: Secondary | ICD-10-CM | POA: Diagnosis not present

## 2022-11-14 DIAGNOSIS — K5730 Diverticulosis of large intestine without perforation or abscess without bleeding: Secondary | ICD-10-CM | POA: Diagnosis not present

## 2022-11-14 DIAGNOSIS — R112 Nausea with vomiting, unspecified: Secondary | ICD-10-CM | POA: Diagnosis not present

## 2022-11-14 DIAGNOSIS — N3289 Other specified disorders of bladder: Secondary | ICD-10-CM | POA: Diagnosis not present

## 2022-11-14 DIAGNOSIS — Z9151 Personal history of suicidal behavior: Secondary | ICD-10-CM | POA: Diagnosis not present

## 2022-11-14 DIAGNOSIS — I708 Atherosclerosis of other arteries: Secondary | ICD-10-CM | POA: Diagnosis not present

## 2022-11-14 DIAGNOSIS — R1013 Epigastric pain: Secondary | ICD-10-CM | POA: Diagnosis not present

## 2022-11-28 DIAGNOSIS — F1120 Opioid dependence, uncomplicated: Secondary | ICD-10-CM | POA: Diagnosis not present

## 2022-11-28 DIAGNOSIS — Z79899 Other long term (current) drug therapy: Secondary | ICD-10-CM | POA: Diagnosis not present

## 2022-11-29 DIAGNOSIS — M5459 Other low back pain: Secondary | ICD-10-CM | POA: Diagnosis not present

## 2022-11-29 DIAGNOSIS — R519 Headache, unspecified: Secondary | ICD-10-CM | POA: Diagnosis not present

## 2022-11-29 DIAGNOSIS — G43009 Migraine without aura, not intractable, without status migrainosus: Secondary | ICD-10-CM | POA: Diagnosis not present

## 2022-11-29 DIAGNOSIS — M545 Low back pain, unspecified: Secondary | ICD-10-CM | POA: Diagnosis not present

## 2022-11-29 DIAGNOSIS — M546 Pain in thoracic spine: Secondary | ICD-10-CM | POA: Diagnosis not present

## 2022-11-29 DIAGNOSIS — Z791 Long term (current) use of non-steroidal anti-inflammatories (NSAID): Secondary | ICD-10-CM | POA: Diagnosis not present

## 2022-11-29 DIAGNOSIS — G43909 Migraine, unspecified, not intractable, without status migrainosus: Secondary | ICD-10-CM | POA: Diagnosis not present

## 2022-12-03 DIAGNOSIS — R609 Edema, unspecified: Secondary | ICD-10-CM | POA: Diagnosis not present

## 2022-12-03 DIAGNOSIS — R0602 Shortness of breath: Secondary | ICD-10-CM | POA: Diagnosis not present

## 2022-12-03 DIAGNOSIS — I8312 Varicose veins of left lower extremity with inflammation: Secondary | ICD-10-CM | POA: Diagnosis not present

## 2022-12-03 DIAGNOSIS — I83893 Varicose veins of bilateral lower extremities with other complications: Secondary | ICD-10-CM | POA: Diagnosis not present

## 2022-12-03 DIAGNOSIS — Z79899 Other long term (current) drug therapy: Secondary | ICD-10-CM | POA: Diagnosis not present

## 2022-12-03 DIAGNOSIS — R2243 Localized swelling, mass and lump, lower limb, bilateral: Secondary | ICD-10-CM | POA: Diagnosis not present

## 2022-12-03 DIAGNOSIS — M7989 Other specified soft tissue disorders: Secondary | ICD-10-CM | POA: Diagnosis not present

## 2022-12-03 DIAGNOSIS — I8311 Varicose veins of right lower extremity with inflammation: Secondary | ICD-10-CM | POA: Diagnosis not present

## 2022-12-06 DIAGNOSIS — M7989 Other specified soft tissue disorders: Secondary | ICD-10-CM | POA: Diagnosis not present

## 2022-12-06 DIAGNOSIS — L03119 Cellulitis of unspecified part of limb: Secondary | ICD-10-CM | POA: Diagnosis not present

## 2022-12-06 DIAGNOSIS — F1020 Alcohol dependence, uncomplicated: Secondary | ICD-10-CM | POA: Diagnosis not present

## 2022-12-06 DIAGNOSIS — J209 Acute bronchitis, unspecified: Secondary | ICD-10-CM | POA: Diagnosis not present

## 2022-12-06 DIAGNOSIS — J439 Emphysema, unspecified: Secondary | ICD-10-CM | POA: Diagnosis not present

## 2022-12-06 DIAGNOSIS — F1029 Alcohol dependence with unspecified alcohol-induced disorder: Secondary | ICD-10-CM | POA: Diagnosis not present

## 2022-12-06 DIAGNOSIS — F329 Major depressive disorder, single episode, unspecified: Secondary | ICD-10-CM | POA: Diagnosis not present

## 2022-12-06 DIAGNOSIS — R45851 Suicidal ideations: Secondary | ICD-10-CM | POA: Diagnosis not present

## 2022-12-06 DIAGNOSIS — R0602 Shortness of breath: Secondary | ICD-10-CM | POA: Diagnosis not present

## 2022-12-06 DIAGNOSIS — F1919 Other psychoactive substance abuse with unspecified psychoactive substance-induced disorder: Secondary | ICD-10-CM | POA: Diagnosis not present

## 2022-12-06 DIAGNOSIS — Z0000 Encounter for general adult medical examination without abnormal findings: Secondary | ICD-10-CM | POA: Diagnosis not present

## 2022-12-06 DIAGNOSIS — R918 Other nonspecific abnormal finding of lung field: Secondary | ICD-10-CM | POA: Diagnosis not present

## 2022-12-06 DIAGNOSIS — K449 Diaphragmatic hernia without obstruction or gangrene: Secondary | ICD-10-CM | POA: Diagnosis not present

## 2022-12-06 DIAGNOSIS — F1090 Alcohol use, unspecified, uncomplicated: Secondary | ICD-10-CM | POA: Diagnosis not present

## 2022-12-06 DIAGNOSIS — F17210 Nicotine dependence, cigarettes, uncomplicated: Secondary | ICD-10-CM | POA: Diagnosis not present

## 2022-12-06 DIAGNOSIS — I10 Essential (primary) hypertension: Secondary | ICD-10-CM | POA: Diagnosis not present

## 2022-12-06 DIAGNOSIS — S99922A Unspecified injury of left foot, initial encounter: Secondary | ICD-10-CM | POA: Diagnosis not present

## 2022-12-06 DIAGNOSIS — S99921A Unspecified injury of right foot, initial encounter: Secondary | ICD-10-CM | POA: Diagnosis not present

## 2022-12-06 DIAGNOSIS — R600 Localized edema: Secondary | ICD-10-CM | POA: Diagnosis not present

## 2022-12-07 DIAGNOSIS — K760 Fatty (change of) liver, not elsewhere classified: Secondary | ICD-10-CM | POA: Diagnosis not present

## 2022-12-07 DIAGNOSIS — E039 Hypothyroidism, unspecified: Secondary | ICD-10-CM | POA: Diagnosis not present

## 2022-12-07 DIAGNOSIS — M7989 Other specified soft tissue disorders: Secondary | ICD-10-CM | POA: Diagnosis not present

## 2022-12-07 DIAGNOSIS — F1090 Alcohol use, unspecified, uncomplicated: Secondary | ICD-10-CM | POA: Diagnosis not present

## 2022-12-07 DIAGNOSIS — R45851 Suicidal ideations: Secondary | ICD-10-CM | POA: Diagnosis not present

## 2022-12-07 DIAGNOSIS — F1114 Opioid abuse with opioid-induced mood disorder: Secondary | ICD-10-CM | POA: Diagnosis not present

## 2022-12-07 DIAGNOSIS — J1569 Pneumonia due to other gram-negative bacteria: Secondary | ICD-10-CM | POA: Diagnosis not present

## 2022-12-07 DIAGNOSIS — F1924 Other psychoactive substance dependence with psychoactive substance-induced mood disorder: Secondary | ICD-10-CM | POA: Diagnosis not present

## 2022-12-07 DIAGNOSIS — Z0000 Encounter for general adult medical examination without abnormal findings: Secondary | ICD-10-CM | POA: Diagnosis not present

## 2022-12-07 DIAGNOSIS — L03119 Cellulitis of unspecified part of limb: Secondary | ICD-10-CM | POA: Diagnosis not present

## 2022-12-07 DIAGNOSIS — I517 Cardiomegaly: Secondary | ICD-10-CM | POA: Diagnosis not present

## 2022-12-07 DIAGNOSIS — F329 Major depressive disorder, single episode, unspecified: Secondary | ICD-10-CM | POA: Diagnosis not present

## 2022-12-07 DIAGNOSIS — F1020 Alcohol dependence, uncomplicated: Secondary | ICD-10-CM | POA: Diagnosis not present

## 2022-12-07 DIAGNOSIS — I340 Nonrheumatic mitral (valve) insufficiency: Secondary | ICD-10-CM | POA: Diagnosis not present

## 2022-12-07 DIAGNOSIS — F1919 Other psychoactive substance abuse with unspecified psychoactive substance-induced disorder: Secondary | ICD-10-CM | POA: Diagnosis not present

## 2022-12-07 DIAGNOSIS — Z5900 Homelessness unspecified: Secondary | ICD-10-CM | POA: Diagnosis not present

## 2022-12-07 DIAGNOSIS — I10 Essential (primary) hypertension: Secondary | ICD-10-CM | POA: Diagnosis not present

## 2022-12-07 DIAGNOSIS — F1994 Other psychoactive substance use, unspecified with psychoactive substance-induced mood disorder: Secondary | ICD-10-CM | POA: Diagnosis not present

## 2022-12-07 DIAGNOSIS — R600 Localized edema: Secondary | ICD-10-CM | POA: Diagnosis not present

## 2022-12-07 DIAGNOSIS — J209 Acute bronchitis, unspecified: Secondary | ICD-10-CM | POA: Diagnosis not present

## 2022-12-07 DIAGNOSIS — F1424 Cocaine dependence with cocaine-induced mood disorder: Secondary | ICD-10-CM | POA: Diagnosis not present

## 2022-12-07 DIAGNOSIS — F1024 Alcohol dependence with alcohol-induced mood disorder: Secondary | ICD-10-CM | POA: Diagnosis not present

## 2022-12-07 DIAGNOSIS — K828 Other specified diseases of gallbladder: Secondary | ICD-10-CM | POA: Diagnosis not present

## 2022-12-08 DIAGNOSIS — Z0000 Encounter for general adult medical examination without abnormal findings: Secondary | ICD-10-CM | POA: Diagnosis not present

## 2022-12-08 DIAGNOSIS — F1994 Other psychoactive substance use, unspecified with psychoactive substance-induced mood disorder: Secondary | ICD-10-CM | POA: Diagnosis not present

## 2022-12-09 DIAGNOSIS — I340 Nonrheumatic mitral (valve) insufficiency: Secondary | ICD-10-CM | POA: Diagnosis not present

## 2022-12-09 DIAGNOSIS — K828 Other specified diseases of gallbladder: Secondary | ICD-10-CM | POA: Diagnosis not present

## 2022-12-09 DIAGNOSIS — K760 Fatty (change of) liver, not elsewhere classified: Secondary | ICD-10-CM | POA: Diagnosis not present

## 2022-12-09 DIAGNOSIS — E039 Hypothyroidism, unspecified: Secondary | ICD-10-CM | POA: Diagnosis not present

## 2022-12-09 DIAGNOSIS — I517 Cardiomegaly: Secondary | ICD-10-CM | POA: Diagnosis not present

## 2022-12-09 DIAGNOSIS — F1994 Other psychoactive substance use, unspecified with psychoactive substance-induced mood disorder: Secondary | ICD-10-CM | POA: Diagnosis not present

## 2022-12-09 DIAGNOSIS — M7989 Other specified soft tissue disorders: Secondary | ICD-10-CM | POA: Diagnosis not present

## 2022-12-10 DIAGNOSIS — M7989 Other specified soft tissue disorders: Secondary | ICD-10-CM | POA: Diagnosis not present

## 2022-12-10 DIAGNOSIS — E039 Hypothyroidism, unspecified: Secondary | ICD-10-CM | POA: Diagnosis not present

## 2022-12-10 DIAGNOSIS — F1994 Other psychoactive substance use, unspecified with psychoactive substance-induced mood disorder: Secondary | ICD-10-CM | POA: Diagnosis not present

## 2022-12-11 DIAGNOSIS — F1994 Other psychoactive substance use, unspecified with psychoactive substance-induced mood disorder: Secondary | ICD-10-CM | POA: Diagnosis not present

## 2022-12-12 DIAGNOSIS — M7989 Other specified soft tissue disorders: Secondary | ICD-10-CM | POA: Diagnosis not present

## 2022-12-12 DIAGNOSIS — E039 Hypothyroidism, unspecified: Secondary | ICD-10-CM | POA: Diagnosis not present

## 2022-12-12 DIAGNOSIS — F1994 Other psychoactive substance use, unspecified with psychoactive substance-induced mood disorder: Secondary | ICD-10-CM | POA: Diagnosis not present

## 2022-12-13 DIAGNOSIS — F1994 Other psychoactive substance use, unspecified with psychoactive substance-induced mood disorder: Secondary | ICD-10-CM | POA: Diagnosis not present

## 2022-12-14 DIAGNOSIS — F1994 Other psychoactive substance use, unspecified with psychoactive substance-induced mood disorder: Secondary | ICD-10-CM | POA: Diagnosis not present

## 2022-12-15 DIAGNOSIS — F1994 Other psychoactive substance use, unspecified with psychoactive substance-induced mood disorder: Secondary | ICD-10-CM | POA: Diagnosis not present

## 2022-12-16 DIAGNOSIS — F1994 Other psychoactive substance use, unspecified with psychoactive substance-induced mood disorder: Secondary | ICD-10-CM | POA: Diagnosis not present

## 2022-12-17 DIAGNOSIS — F1420 Cocaine dependence, uncomplicated: Secondary | ICD-10-CM | POA: Diagnosis not present

## 2022-12-17 DIAGNOSIS — F1994 Other psychoactive substance use, unspecified with psychoactive substance-induced mood disorder: Secondary | ICD-10-CM | POA: Diagnosis not present

## 2022-12-17 DIAGNOSIS — F1914 Other psychoactive substance abuse with psychoactive substance-induced mood disorder: Secondary | ICD-10-CM | POA: Diagnosis not present

## 2022-12-17 DIAGNOSIS — F1020 Alcohol dependence, uncomplicated: Secondary | ICD-10-CM | POA: Diagnosis not present

## 2022-12-18 DIAGNOSIS — F411 Generalized anxiety disorder: Secondary | ICD-10-CM | POA: Diagnosis not present

## 2022-12-18 DIAGNOSIS — F332 Major depressive disorder, recurrent severe without psychotic features: Secondary | ICD-10-CM | POA: Diagnosis not present

## 2022-12-19 DIAGNOSIS — F332 Major depressive disorder, recurrent severe without psychotic features: Secondary | ICD-10-CM | POA: Diagnosis not present

## 2022-12-19 DIAGNOSIS — F411 Generalized anxiety disorder: Secondary | ICD-10-CM | POA: Diagnosis not present

## 2022-12-20 DIAGNOSIS — F332 Major depressive disorder, recurrent severe without psychotic features: Secondary | ICD-10-CM | POA: Diagnosis not present

## 2022-12-20 DIAGNOSIS — F411 Generalized anxiety disorder: Secondary | ICD-10-CM | POA: Diagnosis not present

## 2022-12-21 DIAGNOSIS — F332 Major depressive disorder, recurrent severe without psychotic features: Secondary | ICD-10-CM | POA: Diagnosis not present

## 2022-12-21 DIAGNOSIS — F411 Generalized anxiety disorder: Secondary | ICD-10-CM | POA: Diagnosis not present

## 2022-12-23 DIAGNOSIS — F332 Major depressive disorder, recurrent severe without psychotic features: Secondary | ICD-10-CM | POA: Diagnosis not present

## 2022-12-23 DIAGNOSIS — F411 Generalized anxiety disorder: Secondary | ICD-10-CM | POA: Diagnosis not present

## 2022-12-24 DIAGNOSIS — F411 Generalized anxiety disorder: Secondary | ICD-10-CM | POA: Diagnosis not present

## 2022-12-24 DIAGNOSIS — F332 Major depressive disorder, recurrent severe without psychotic features: Secondary | ICD-10-CM | POA: Diagnosis not present

## 2022-12-25 DIAGNOSIS — F411 Generalized anxiety disorder: Secondary | ICD-10-CM | POA: Diagnosis not present

## 2022-12-25 DIAGNOSIS — F332 Major depressive disorder, recurrent severe without psychotic features: Secondary | ICD-10-CM | POA: Diagnosis not present

## 2022-12-26 DIAGNOSIS — F332 Major depressive disorder, recurrent severe without psychotic features: Secondary | ICD-10-CM | POA: Diagnosis not present

## 2022-12-26 DIAGNOSIS — F411 Generalized anxiety disorder: Secondary | ICD-10-CM | POA: Diagnosis not present

## 2022-12-27 DIAGNOSIS — F411 Generalized anxiety disorder: Secondary | ICD-10-CM | POA: Diagnosis not present

## 2022-12-27 DIAGNOSIS — F332 Major depressive disorder, recurrent severe without psychotic features: Secondary | ICD-10-CM | POA: Diagnosis not present

## 2022-12-28 DIAGNOSIS — F332 Major depressive disorder, recurrent severe without psychotic features: Secondary | ICD-10-CM | POA: Diagnosis not present

## 2022-12-28 DIAGNOSIS — F411 Generalized anxiety disorder: Secondary | ICD-10-CM | POA: Diagnosis not present

## 2022-12-30 DIAGNOSIS — F411 Generalized anxiety disorder: Secondary | ICD-10-CM | POA: Diagnosis not present

## 2022-12-30 DIAGNOSIS — F332 Major depressive disorder, recurrent severe without psychotic features: Secondary | ICD-10-CM | POA: Diagnosis not present

## 2022-12-31 DIAGNOSIS — F411 Generalized anxiety disorder: Secondary | ICD-10-CM | POA: Diagnosis not present

## 2022-12-31 DIAGNOSIS — F332 Major depressive disorder, recurrent severe without psychotic features: Secondary | ICD-10-CM | POA: Diagnosis not present

## 2022-12-31 DIAGNOSIS — F1914 Other psychoactive substance abuse with psychoactive substance-induced mood disorder: Secondary | ICD-10-CM | POA: Diagnosis not present

## 2022-12-31 DIAGNOSIS — F1420 Cocaine dependence, uncomplicated: Secondary | ICD-10-CM | POA: Diagnosis not present

## 2022-12-31 DIAGNOSIS — F1020 Alcohol dependence, uncomplicated: Secondary | ICD-10-CM | POA: Diagnosis not present

## 2023-01-01 DIAGNOSIS — F332 Major depressive disorder, recurrent severe without psychotic features: Secondary | ICD-10-CM | POA: Diagnosis not present

## 2023-01-01 DIAGNOSIS — F411 Generalized anxiety disorder: Secondary | ICD-10-CM | POA: Diagnosis not present

## 2023-01-02 DIAGNOSIS — F332 Major depressive disorder, recurrent severe without psychotic features: Secondary | ICD-10-CM | POA: Diagnosis not present

## 2023-01-02 DIAGNOSIS — F411 Generalized anxiety disorder: Secondary | ICD-10-CM | POA: Diagnosis not present

## 2023-01-03 DIAGNOSIS — F411 Generalized anxiety disorder: Secondary | ICD-10-CM | POA: Diagnosis not present

## 2023-01-03 DIAGNOSIS — F332 Major depressive disorder, recurrent severe without psychotic features: Secondary | ICD-10-CM | POA: Diagnosis not present

## 2023-01-04 DIAGNOSIS — F332 Major depressive disorder, recurrent severe without psychotic features: Secondary | ICD-10-CM | POA: Diagnosis not present

## 2023-01-04 DIAGNOSIS — F411 Generalized anxiety disorder: Secondary | ICD-10-CM | POA: Diagnosis not present

## 2023-01-06 DIAGNOSIS — F332 Major depressive disorder, recurrent severe without psychotic features: Secondary | ICD-10-CM | POA: Diagnosis not present

## 2023-01-06 DIAGNOSIS — F411 Generalized anxiety disorder: Secondary | ICD-10-CM | POA: Diagnosis not present

## 2023-01-07 DIAGNOSIS — F411 Generalized anxiety disorder: Secondary | ICD-10-CM | POA: Diagnosis not present

## 2023-01-07 DIAGNOSIS — F332 Major depressive disorder, recurrent severe without psychotic features: Secondary | ICD-10-CM | POA: Diagnosis not present

## 2023-01-08 DIAGNOSIS — F332 Major depressive disorder, recurrent severe without psychotic features: Secondary | ICD-10-CM | POA: Diagnosis not present

## 2023-01-08 DIAGNOSIS — F411 Generalized anxiety disorder: Secondary | ICD-10-CM | POA: Diagnosis not present

## 2023-01-09 DIAGNOSIS — L03211 Cellulitis of face: Secondary | ICD-10-CM | POA: Diagnosis not present

## 2023-01-09 DIAGNOSIS — R29818 Other symptoms and signs involving the nervous system: Secondary | ICD-10-CM | POA: Diagnosis not present

## 2023-01-09 DIAGNOSIS — F332 Major depressive disorder, recurrent severe without psychotic features: Secondary | ICD-10-CM | POA: Diagnosis not present

## 2023-01-09 DIAGNOSIS — R918 Other nonspecific abnormal finding of lung field: Secondary | ICD-10-CM | POA: Diagnosis not present

## 2023-01-09 DIAGNOSIS — R590 Localized enlarged lymph nodes: Secondary | ICD-10-CM | POA: Diagnosis not present

## 2023-01-09 DIAGNOSIS — L0201 Cutaneous abscess of face: Secondary | ICD-10-CM | POA: Diagnosis not present

## 2023-01-09 DIAGNOSIS — F411 Generalized anxiety disorder: Secondary | ICD-10-CM | POA: Diagnosis not present

## 2023-01-09 DIAGNOSIS — R059 Cough, unspecified: Secondary | ICD-10-CM | POA: Diagnosis not present

## 2023-01-09 DIAGNOSIS — R519 Headache, unspecified: Secondary | ICD-10-CM | POA: Diagnosis not present

## 2023-01-10 DIAGNOSIS — E039 Hypothyroidism, unspecified: Secondary | ICD-10-CM | POA: Diagnosis not present

## 2023-01-10 DIAGNOSIS — R519 Headache, unspecified: Secondary | ICD-10-CM | POA: Diagnosis not present

## 2023-01-10 DIAGNOSIS — F32A Depression, unspecified: Secondary | ICD-10-CM | POA: Diagnosis not present

## 2023-01-10 DIAGNOSIS — L0201 Cutaneous abscess of face: Secondary | ICD-10-CM | POA: Diagnosis not present

## 2023-01-10 DIAGNOSIS — Z8614 Personal history of Methicillin resistant Staphylococcus aureus infection: Secondary | ICD-10-CM | POA: Diagnosis not present

## 2023-01-10 DIAGNOSIS — R918 Other nonspecific abnormal finding of lung field: Secondary | ICD-10-CM | POA: Diagnosis not present

## 2023-01-10 DIAGNOSIS — F1910 Other psychoactive substance abuse, uncomplicated: Secondary | ICD-10-CM | POA: Diagnosis not present

## 2023-01-10 DIAGNOSIS — F332 Major depressive disorder, recurrent severe without psychotic features: Secondary | ICD-10-CM | POA: Diagnosis not present

## 2023-01-10 DIAGNOSIS — R590 Localized enlarged lymph nodes: Secondary | ICD-10-CM | POA: Diagnosis not present

## 2023-01-10 DIAGNOSIS — G8929 Other chronic pain: Secondary | ICD-10-CM | POA: Diagnosis not present

## 2023-01-10 DIAGNOSIS — F419 Anxiety disorder, unspecified: Secondary | ICD-10-CM | POA: Diagnosis not present

## 2023-01-10 DIAGNOSIS — W010XXA Fall on same level from slipping, tripping and stumbling without subsequent striking against object, initial encounter: Secondary | ICD-10-CM | POA: Diagnosis not present

## 2023-01-10 DIAGNOSIS — F411 Generalized anxiety disorder: Secondary | ICD-10-CM | POA: Diagnosis not present

## 2023-01-10 DIAGNOSIS — Z043 Encounter for examination and observation following other accident: Secondary | ICD-10-CM | POA: Diagnosis not present

## 2023-01-10 DIAGNOSIS — R059 Cough, unspecified: Secondary | ICD-10-CM | POA: Diagnosis not present

## 2023-01-10 DIAGNOSIS — M25522 Pain in left elbow: Secondary | ICD-10-CM | POA: Diagnosis not present

## 2023-01-10 DIAGNOSIS — R29818 Other symptoms and signs involving the nervous system: Secondary | ICD-10-CM | POA: Diagnosis not present

## 2023-01-10 DIAGNOSIS — L03211 Cellulitis of face: Secondary | ICD-10-CM | POA: Diagnosis not present

## 2023-01-11 DIAGNOSIS — Z043 Encounter for examination and observation following other accident: Secondary | ICD-10-CM | POA: Diagnosis not present

## 2023-01-14 DIAGNOSIS — F332 Major depressive disorder, recurrent severe without psychotic features: Secondary | ICD-10-CM | POA: Diagnosis not present

## 2023-01-14 DIAGNOSIS — F1914 Other psychoactive substance abuse with psychoactive substance-induced mood disorder: Secondary | ICD-10-CM | POA: Diagnosis not present

## 2023-01-14 DIAGNOSIS — F411 Generalized anxiety disorder: Secondary | ICD-10-CM | POA: Diagnosis not present

## 2023-01-14 DIAGNOSIS — F1020 Alcohol dependence, uncomplicated: Secondary | ICD-10-CM | POA: Diagnosis not present

## 2023-01-14 DIAGNOSIS — F1420 Cocaine dependence, uncomplicated: Secondary | ICD-10-CM | POA: Diagnosis not present

## 2023-01-15 DIAGNOSIS — F411 Generalized anxiety disorder: Secondary | ICD-10-CM | POA: Diagnosis not present

## 2023-01-15 DIAGNOSIS — F332 Major depressive disorder, recurrent severe without psychotic features: Secondary | ICD-10-CM | POA: Diagnosis not present

## 2023-01-16 DIAGNOSIS — F411 Generalized anxiety disorder: Secondary | ICD-10-CM | POA: Diagnosis not present

## 2023-01-16 DIAGNOSIS — F332 Major depressive disorder, recurrent severe without psychotic features: Secondary | ICD-10-CM | POA: Diagnosis not present

## 2023-01-17 DIAGNOSIS — F332 Major depressive disorder, recurrent severe without psychotic features: Secondary | ICD-10-CM | POA: Diagnosis not present

## 2023-01-17 DIAGNOSIS — F411 Generalized anxiety disorder: Secondary | ICD-10-CM | POA: Diagnosis not present

## 2023-01-18 DIAGNOSIS — F332 Major depressive disorder, recurrent severe without psychotic features: Secondary | ICD-10-CM | POA: Diagnosis not present

## 2023-01-18 DIAGNOSIS — F411 Generalized anxiety disorder: Secondary | ICD-10-CM | POA: Diagnosis not present

## 2023-01-20 DIAGNOSIS — F411 Generalized anxiety disorder: Secondary | ICD-10-CM | POA: Diagnosis not present

## 2023-01-20 DIAGNOSIS — F332 Major depressive disorder, recurrent severe without psychotic features: Secondary | ICD-10-CM | POA: Diagnosis not present

## 2023-01-21 DIAGNOSIS — F332 Major depressive disorder, recurrent severe without psychotic features: Secondary | ICD-10-CM | POA: Diagnosis not present

## 2023-01-21 DIAGNOSIS — F411 Generalized anxiety disorder: Secondary | ICD-10-CM | POA: Diagnosis not present

## 2023-01-22 DIAGNOSIS — F411 Generalized anxiety disorder: Secondary | ICD-10-CM | POA: Diagnosis not present

## 2023-01-22 DIAGNOSIS — F332 Major depressive disorder, recurrent severe without psychotic features: Secondary | ICD-10-CM | POA: Diagnosis not present

## 2023-01-23 DIAGNOSIS — F411 Generalized anxiety disorder: Secondary | ICD-10-CM | POA: Diagnosis not present

## 2023-01-23 DIAGNOSIS — F332 Major depressive disorder, recurrent severe without psychotic features: Secondary | ICD-10-CM | POA: Diagnosis not present

## 2023-01-24 DIAGNOSIS — F332 Major depressive disorder, recurrent severe without psychotic features: Secondary | ICD-10-CM | POA: Diagnosis not present

## 2023-01-24 DIAGNOSIS — F411 Generalized anxiety disorder: Secondary | ICD-10-CM | POA: Diagnosis not present

## 2023-01-25 DIAGNOSIS — F411 Generalized anxiety disorder: Secondary | ICD-10-CM | POA: Diagnosis not present

## 2023-01-25 DIAGNOSIS — F332 Major depressive disorder, recurrent severe without psychotic features: Secondary | ICD-10-CM | POA: Diagnosis not present

## 2023-01-27 DIAGNOSIS — F332 Major depressive disorder, recurrent severe without psychotic features: Secondary | ICD-10-CM | POA: Diagnosis not present

## 2023-01-27 DIAGNOSIS — F411 Generalized anxiety disorder: Secondary | ICD-10-CM | POA: Diagnosis not present

## 2023-01-28 DIAGNOSIS — F1020 Alcohol dependence, uncomplicated: Secondary | ICD-10-CM | POA: Diagnosis not present

## 2023-01-28 DIAGNOSIS — F411 Generalized anxiety disorder: Secondary | ICD-10-CM | POA: Diagnosis not present

## 2023-01-28 DIAGNOSIS — F1420 Cocaine dependence, uncomplicated: Secondary | ICD-10-CM | POA: Diagnosis not present

## 2023-01-28 DIAGNOSIS — F1120 Opioid dependence, uncomplicated: Secondary | ICD-10-CM | POA: Diagnosis not present

## 2023-01-28 DIAGNOSIS — F332 Major depressive disorder, recurrent severe without psychotic features: Secondary | ICD-10-CM | POA: Diagnosis not present

## 2023-01-29 DIAGNOSIS — F411 Generalized anxiety disorder: Secondary | ICD-10-CM | POA: Diagnosis not present

## 2023-01-29 DIAGNOSIS — F332 Major depressive disorder, recurrent severe without psychotic features: Secondary | ICD-10-CM | POA: Diagnosis not present

## 2023-01-30 DIAGNOSIS — F411 Generalized anxiety disorder: Secondary | ICD-10-CM | POA: Diagnosis not present

## 2023-01-30 DIAGNOSIS — F332 Major depressive disorder, recurrent severe without psychotic features: Secondary | ICD-10-CM | POA: Diagnosis not present

## 2023-01-31 DIAGNOSIS — F411 Generalized anxiety disorder: Secondary | ICD-10-CM | POA: Diagnosis not present

## 2023-01-31 DIAGNOSIS — F332 Major depressive disorder, recurrent severe without psychotic features: Secondary | ICD-10-CM | POA: Diagnosis not present

## 2023-02-03 DIAGNOSIS — F332 Major depressive disorder, recurrent severe without psychotic features: Secondary | ICD-10-CM | POA: Diagnosis not present

## 2023-02-03 DIAGNOSIS — F411 Generalized anxiety disorder: Secondary | ICD-10-CM | POA: Diagnosis not present

## 2023-02-06 DIAGNOSIS — F332 Major depressive disorder, recurrent severe without psychotic features: Secondary | ICD-10-CM | POA: Diagnosis not present

## 2023-02-06 DIAGNOSIS — F411 Generalized anxiety disorder: Secondary | ICD-10-CM | POA: Diagnosis not present

## 2023-02-07 DIAGNOSIS — F411 Generalized anxiety disorder: Secondary | ICD-10-CM | POA: Diagnosis not present

## 2023-02-07 DIAGNOSIS — F332 Major depressive disorder, recurrent severe without psychotic features: Secondary | ICD-10-CM | POA: Diagnosis not present

## 2023-02-08 DIAGNOSIS — F411 Generalized anxiety disorder: Secondary | ICD-10-CM | POA: Diagnosis not present

## 2023-02-08 DIAGNOSIS — F332 Major depressive disorder, recurrent severe without psychotic features: Secondary | ICD-10-CM | POA: Diagnosis not present

## 2023-02-10 DIAGNOSIS — F332 Major depressive disorder, recurrent severe without psychotic features: Secondary | ICD-10-CM | POA: Diagnosis not present

## 2023-02-10 DIAGNOSIS — F411 Generalized anxiety disorder: Secondary | ICD-10-CM | POA: Diagnosis not present

## 2023-02-11 DIAGNOSIS — F1120 Opioid dependence, uncomplicated: Secondary | ICD-10-CM | POA: Diagnosis not present

## 2023-02-11 DIAGNOSIS — F411 Generalized anxiety disorder: Secondary | ICD-10-CM | POA: Diagnosis not present

## 2023-02-11 DIAGNOSIS — F1020 Alcohol dependence, uncomplicated: Secondary | ICD-10-CM | POA: Diagnosis not present

## 2023-02-11 DIAGNOSIS — F332 Major depressive disorder, recurrent severe without psychotic features: Secondary | ICD-10-CM | POA: Diagnosis not present

## 2023-02-11 DIAGNOSIS — F1420 Cocaine dependence, uncomplicated: Secondary | ICD-10-CM | POA: Diagnosis not present

## 2023-02-12 DIAGNOSIS — F411 Generalized anxiety disorder: Secondary | ICD-10-CM | POA: Diagnosis not present

## 2023-02-12 DIAGNOSIS — F332 Major depressive disorder, recurrent severe without psychotic features: Secondary | ICD-10-CM | POA: Diagnosis not present

## 2023-02-13 DIAGNOSIS — F332 Major depressive disorder, recurrent severe without psychotic features: Secondary | ICD-10-CM | POA: Diagnosis not present

## 2023-02-13 DIAGNOSIS — F411 Generalized anxiety disorder: Secondary | ICD-10-CM | POA: Diagnosis not present

## 2023-02-14 DIAGNOSIS — F332 Major depressive disorder, recurrent severe without psychotic features: Secondary | ICD-10-CM | POA: Diagnosis not present

## 2023-02-14 DIAGNOSIS — F411 Generalized anxiety disorder: Secondary | ICD-10-CM | POA: Diagnosis not present

## 2023-02-17 DIAGNOSIS — F332 Major depressive disorder, recurrent severe without psychotic features: Secondary | ICD-10-CM | POA: Diagnosis not present

## 2023-02-17 DIAGNOSIS — F411 Generalized anxiety disorder: Secondary | ICD-10-CM | POA: Diagnosis not present

## 2023-02-18 DIAGNOSIS — F332 Major depressive disorder, recurrent severe without psychotic features: Secondary | ICD-10-CM | POA: Diagnosis not present

## 2023-02-18 DIAGNOSIS — F411 Generalized anxiety disorder: Secondary | ICD-10-CM | POA: Diagnosis not present

## 2023-02-19 DIAGNOSIS — F332 Major depressive disorder, recurrent severe without psychotic features: Secondary | ICD-10-CM | POA: Diagnosis not present

## 2023-02-19 DIAGNOSIS — F411 Generalized anxiety disorder: Secondary | ICD-10-CM | POA: Diagnosis not present

## 2023-02-20 DIAGNOSIS — F411 Generalized anxiety disorder: Secondary | ICD-10-CM | POA: Diagnosis not present

## 2023-02-20 DIAGNOSIS — F332 Major depressive disorder, recurrent severe without psychotic features: Secondary | ICD-10-CM | POA: Diagnosis not present

## 2023-02-24 DIAGNOSIS — F332 Major depressive disorder, recurrent severe without psychotic features: Secondary | ICD-10-CM | POA: Diagnosis not present

## 2023-02-24 DIAGNOSIS — F411 Generalized anxiety disorder: Secondary | ICD-10-CM | POA: Diagnosis not present

## 2023-02-25 DIAGNOSIS — F411 Generalized anxiety disorder: Secondary | ICD-10-CM | POA: Diagnosis not present

## 2023-02-25 DIAGNOSIS — F332 Major depressive disorder, recurrent severe without psychotic features: Secondary | ICD-10-CM | POA: Diagnosis not present

## 2023-02-26 DIAGNOSIS — F1420 Cocaine dependence, uncomplicated: Secondary | ICD-10-CM | POA: Diagnosis not present

## 2023-02-26 DIAGNOSIS — F1020 Alcohol dependence, uncomplicated: Secondary | ICD-10-CM | POA: Diagnosis not present

## 2023-02-26 DIAGNOSIS — F1120 Opioid dependence, uncomplicated: Secondary | ICD-10-CM | POA: Diagnosis not present

## 2023-02-26 DIAGNOSIS — F411 Generalized anxiety disorder: Secondary | ICD-10-CM | POA: Diagnosis not present

## 2023-02-26 DIAGNOSIS — F332 Major depressive disorder, recurrent severe without psychotic features: Secondary | ICD-10-CM | POA: Diagnosis not present

## 2023-02-27 DIAGNOSIS — F332 Major depressive disorder, recurrent severe without psychotic features: Secondary | ICD-10-CM | POA: Diagnosis not present

## 2023-02-27 DIAGNOSIS — F411 Generalized anxiety disorder: Secondary | ICD-10-CM | POA: Diagnosis not present

## 2023-02-28 DIAGNOSIS — F332 Major depressive disorder, recurrent severe without psychotic features: Secondary | ICD-10-CM | POA: Diagnosis not present

## 2023-02-28 DIAGNOSIS — F411 Generalized anxiety disorder: Secondary | ICD-10-CM | POA: Diagnosis not present

## 2023-03-03 DIAGNOSIS — F332 Major depressive disorder, recurrent severe without psychotic features: Secondary | ICD-10-CM | POA: Diagnosis not present

## 2023-03-03 DIAGNOSIS — F411 Generalized anxiety disorder: Secondary | ICD-10-CM | POA: Diagnosis not present

## 2023-03-04 DIAGNOSIS — F411 Generalized anxiety disorder: Secondary | ICD-10-CM | POA: Diagnosis not present

## 2023-03-04 DIAGNOSIS — F332 Major depressive disorder, recurrent severe without psychotic features: Secondary | ICD-10-CM | POA: Diagnosis not present

## 2023-03-05 DIAGNOSIS — F332 Major depressive disorder, recurrent severe without psychotic features: Secondary | ICD-10-CM | POA: Diagnosis not present

## 2023-03-05 DIAGNOSIS — F411 Generalized anxiety disorder: Secondary | ICD-10-CM | POA: Diagnosis not present

## 2023-03-06 DIAGNOSIS — F332 Major depressive disorder, recurrent severe without psychotic features: Secondary | ICD-10-CM | POA: Diagnosis not present

## 2023-03-06 DIAGNOSIS — F411 Generalized anxiety disorder: Secondary | ICD-10-CM | POA: Diagnosis not present

## 2023-03-07 DIAGNOSIS — F332 Major depressive disorder, recurrent severe without psychotic features: Secondary | ICD-10-CM | POA: Diagnosis not present

## 2023-03-07 DIAGNOSIS — F411 Generalized anxiety disorder: Secondary | ICD-10-CM | POA: Diagnosis not present

## 2023-03-10 DIAGNOSIS — F332 Major depressive disorder, recurrent severe without psychotic features: Secondary | ICD-10-CM | POA: Diagnosis not present

## 2023-03-10 DIAGNOSIS — F411 Generalized anxiety disorder: Secondary | ICD-10-CM | POA: Diagnosis not present

## 2023-03-11 DIAGNOSIS — F332 Major depressive disorder, recurrent severe without psychotic features: Secondary | ICD-10-CM | POA: Diagnosis not present

## 2023-03-11 DIAGNOSIS — F411 Generalized anxiety disorder: Secondary | ICD-10-CM | POA: Diagnosis not present

## 2023-03-12 DIAGNOSIS — F332 Major depressive disorder, recurrent severe without psychotic features: Secondary | ICD-10-CM | POA: Diagnosis not present

## 2023-03-12 DIAGNOSIS — F411 Generalized anxiety disorder: Secondary | ICD-10-CM | POA: Diagnosis not present

## 2023-03-13 DIAGNOSIS — F332 Major depressive disorder, recurrent severe without psychotic features: Secondary | ICD-10-CM | POA: Diagnosis not present

## 2023-03-13 DIAGNOSIS — F411 Generalized anxiety disorder: Secondary | ICD-10-CM | POA: Diagnosis not present

## 2023-03-14 DIAGNOSIS — F411 Generalized anxiety disorder: Secondary | ICD-10-CM | POA: Diagnosis not present

## 2023-03-14 DIAGNOSIS — F332 Major depressive disorder, recurrent severe without psychotic features: Secondary | ICD-10-CM | POA: Diagnosis not present

## 2023-03-17 DIAGNOSIS — F411 Generalized anxiety disorder: Secondary | ICD-10-CM | POA: Diagnosis not present

## 2023-03-17 DIAGNOSIS — F332 Major depressive disorder, recurrent severe without psychotic features: Secondary | ICD-10-CM | POA: Diagnosis not present

## 2023-03-19 DIAGNOSIS — F332 Major depressive disorder, recurrent severe without psychotic features: Secondary | ICD-10-CM | POA: Diagnosis not present

## 2023-03-19 DIAGNOSIS — F411 Generalized anxiety disorder: Secondary | ICD-10-CM | POA: Diagnosis not present

## 2023-03-20 DIAGNOSIS — F332 Major depressive disorder, recurrent severe without psychotic features: Secondary | ICD-10-CM | POA: Diagnosis not present

## 2023-03-20 DIAGNOSIS — F411 Generalized anxiety disorder: Secondary | ICD-10-CM | POA: Diagnosis not present

## 2023-03-21 DIAGNOSIS — F411 Generalized anxiety disorder: Secondary | ICD-10-CM | POA: Diagnosis not present

## 2023-03-21 DIAGNOSIS — F332 Major depressive disorder, recurrent severe without psychotic features: Secondary | ICD-10-CM | POA: Diagnosis not present

## 2023-03-24 DIAGNOSIS — F411 Generalized anxiety disorder: Secondary | ICD-10-CM | POA: Diagnosis not present

## 2023-03-24 DIAGNOSIS — F332 Major depressive disorder, recurrent severe without psychotic features: Secondary | ICD-10-CM | POA: Diagnosis not present

## 2023-03-26 DIAGNOSIS — F411 Generalized anxiety disorder: Secondary | ICD-10-CM | POA: Diagnosis not present

## 2023-03-26 DIAGNOSIS — F332 Major depressive disorder, recurrent severe without psychotic features: Secondary | ICD-10-CM | POA: Diagnosis not present

## 2023-03-26 DIAGNOSIS — F1420 Cocaine dependence, uncomplicated: Secondary | ICD-10-CM | POA: Diagnosis not present

## 2023-03-26 DIAGNOSIS — F1120 Opioid dependence, uncomplicated: Secondary | ICD-10-CM | POA: Diagnosis not present

## 2023-03-26 DIAGNOSIS — F1020 Alcohol dependence, uncomplicated: Secondary | ICD-10-CM | POA: Diagnosis not present

## 2023-03-27 DIAGNOSIS — F411 Generalized anxiety disorder: Secondary | ICD-10-CM | POA: Diagnosis not present

## 2023-03-27 DIAGNOSIS — F332 Major depressive disorder, recurrent severe without psychotic features: Secondary | ICD-10-CM | POA: Diagnosis not present

## 2023-03-31 DIAGNOSIS — F411 Generalized anxiety disorder: Secondary | ICD-10-CM | POA: Diagnosis not present

## 2023-03-31 DIAGNOSIS — F332 Major depressive disorder, recurrent severe without psychotic features: Secondary | ICD-10-CM | POA: Diagnosis not present

## 2023-04-02 ENCOUNTER — Telehealth (INDEPENDENT_AMBULATORY_CARE_PROVIDER_SITE_OTHER): Payer: Self-pay

## 2023-04-02 DIAGNOSIS — F411 Generalized anxiety disorder: Secondary | ICD-10-CM | POA: Diagnosis not present

## 2023-04-02 DIAGNOSIS — F332 Major depressive disorder, recurrent severe without psychotic features: Secondary | ICD-10-CM | POA: Diagnosis not present

## 2023-04-02 NOTE — Telephone Encounter (Signed)
Tried to call pt to schedule laser ablation appt all numbers listed are unavailable at this time

## 2023-04-03 DIAGNOSIS — F411 Generalized anxiety disorder: Secondary | ICD-10-CM | POA: Diagnosis not present

## 2023-04-03 DIAGNOSIS — F332 Major depressive disorder, recurrent severe without psychotic features: Secondary | ICD-10-CM | POA: Diagnosis not present

## 2023-04-07 DIAGNOSIS — F411 Generalized anxiety disorder: Secondary | ICD-10-CM | POA: Diagnosis not present

## 2023-04-07 DIAGNOSIS — F332 Major depressive disorder, recurrent severe without psychotic features: Secondary | ICD-10-CM | POA: Diagnosis not present

## 2023-04-08 DIAGNOSIS — F332 Major depressive disorder, recurrent severe without psychotic features: Secondary | ICD-10-CM | POA: Diagnosis not present

## 2023-04-08 DIAGNOSIS — F411 Generalized anxiety disorder: Secondary | ICD-10-CM | POA: Diagnosis not present

## 2023-04-13 DIAGNOSIS — R45851 Suicidal ideations: Secondary | ICD-10-CM | POA: Diagnosis not present

## 2023-04-13 DIAGNOSIS — R001 Bradycardia, unspecified: Secondary | ICD-10-CM | POA: Diagnosis not present

## 2023-04-13 DIAGNOSIS — Z1152 Encounter for screening for COVID-19: Secondary | ICD-10-CM | POA: Diagnosis not present

## 2023-04-13 DIAGNOSIS — G8929 Other chronic pain: Secondary | ICD-10-CM | POA: Diagnosis not present

## 2023-04-13 DIAGNOSIS — Z79899 Other long term (current) drug therapy: Secondary | ICD-10-CM | POA: Diagnosis not present

## 2023-04-13 DIAGNOSIS — F10129 Alcohol abuse with intoxication, unspecified: Secondary | ICD-10-CM | POA: Diagnosis not present

## 2023-04-13 DIAGNOSIS — M549 Dorsalgia, unspecified: Secondary | ICD-10-CM | POA: Diagnosis not present

## 2023-04-13 DIAGNOSIS — M5459 Other low back pain: Secondary | ICD-10-CM | POA: Diagnosis not present

## 2023-04-13 DIAGNOSIS — F10929 Alcohol use, unspecified with intoxication, unspecified: Secondary | ICD-10-CM | POA: Diagnosis not present

## 2023-04-13 DIAGNOSIS — Z79890 Hormone replacement therapy (postmenopausal): Secondary | ICD-10-CM | POA: Diagnosis not present

## 2023-04-13 DIAGNOSIS — F1410 Cocaine abuse, uncomplicated: Secondary | ICD-10-CM | POA: Diagnosis not present

## 2023-04-13 DIAGNOSIS — Z5901 Sheltered homelessness: Secondary | ICD-10-CM | POA: Diagnosis not present

## 2023-04-13 DIAGNOSIS — F1910 Other psychoactive substance abuse, uncomplicated: Secondary | ICD-10-CM | POA: Diagnosis not present

## 2023-04-14 DIAGNOSIS — F1910 Other psychoactive substance abuse, uncomplicated: Secondary | ICD-10-CM | POA: Diagnosis not present

## 2023-04-14 DIAGNOSIS — M5459 Other low back pain: Secondary | ICD-10-CM | POA: Diagnosis not present

## 2023-04-14 DIAGNOSIS — F10929 Alcohol use, unspecified with intoxication, unspecified: Secondary | ICD-10-CM | POA: Diagnosis not present

## 2023-04-14 DIAGNOSIS — R45851 Suicidal ideations: Secondary | ICD-10-CM | POA: Diagnosis not present

## 2023-04-15 ENCOUNTER — Telehealth: Payer: Self-pay

## 2023-04-15 NOTE — Transitions of Care (Post Inpatient/ED Visit) (Signed)
   04/15/2023  Name: Guy Rodriguez MRN: [id number] DOB: 03-23-1981  Today's TOC FU Call Status: Today's TOC FU Call Status:: Unsuccessful Call (1st Attempt) Unsuccessful Call (1st Attempt) Date: 04/15/23  Attempted to reach the patient regarding the most recent Inpatient/ED visit.  Follow Up Plan: Additional outreach attempts will be made to reach the patient to complete the Transitions of Care (Post Inpatient/ED visit) call.   Signature Karena Addison, LPN [facility] Nurse Health Advisor Direct Dial [phone]

## 2023-04-16 NOTE — Transitions of Care (Post Inpatient/ED Visit) (Signed)
   04/16/2023  Name: Guy Rodriguez MRN: [id number] DOB: 1980/11/17  Today's TOC FU Call Status: Today's TOC FU Call Status:: Unsuccessful Call (2nd Attempt) Unsuccessful Call (1st Attempt) Date: 04/15/23 Unsuccessful Call (2nd Attempt) Date: 04/16/23  Attempted to reach the patient regarding the most recent Inpatient/ED visit.  Follow Up Plan: Additional outreach attempts will be made to reach the patient to complete the Transitions of Care (Post Inpatient/ED visit) call.   Signature Karena Addison, LPN [facility] Nurse Health Advisor Direct Dial [phone]

## 2023-04-22 NOTE — Transitions of Care (Post Inpatient/ED Visit) (Signed)
   04/22/2023  Name: Pearlie Lafosse MRN: [id number] DOB: 30-Jan-1981  Today's TOC FU Call Status: Today's TOC FU Call Status:: Unsuccessful Call (3rd Attempt) Unsuccessful Call (1st Attempt) Date: 04/15/23 Unsuccessful Call (2nd Attempt) Date: 04/16/23 Unsuccessful Call (3rd Attempt) Date: 04/22/23  Attempted to reach the patient regarding the most recent Inpatient/ED visit.  Follow Up Plan: No further outreach attempts will be made at this time. We have been unable to contact the patient.  Signature Karena Addison, LPN [facility] Nurse Health Advisor Direct Dial [phone]

## 2023-04-27 DIAGNOSIS — F1010 Alcohol abuse, uncomplicated: Secondary | ICD-10-CM | POA: Diagnosis not present

## 2023-04-27 DIAGNOSIS — R109 Unspecified abdominal pain: Secondary | ICD-10-CM | POA: Diagnosis not present

## 2023-04-27 DIAGNOSIS — K760 Fatty (change of) liver, not elsewhere classified: Secondary | ICD-10-CM | POA: Diagnosis not present

## 2023-04-27 DIAGNOSIS — M545 Low back pain, unspecified: Secondary | ICD-10-CM | POA: Diagnosis not present

## 2023-04-27 DIAGNOSIS — M549 Dorsalgia, unspecified: Secondary | ICD-10-CM | POA: Diagnosis not present

## 2023-04-27 DIAGNOSIS — F10129 Alcohol abuse with intoxication, unspecified: Secondary | ICD-10-CM | POA: Diagnosis not present

## 2023-04-27 DIAGNOSIS — R110 Nausea: Secondary | ICD-10-CM | POA: Diagnosis not present

## 2023-04-29 DIAGNOSIS — F1994 Other psychoactive substance use, unspecified with psychoactive substance-induced mood disorder: Secondary | ICD-10-CM | POA: Diagnosis not present

## 2023-05-02 DIAGNOSIS — I83812 Varicose veins of left lower extremities with pain: Secondary | ICD-10-CM | POA: Diagnosis not present

## 2023-05-02 DIAGNOSIS — I82402 Acute embolism and thrombosis of unspecified deep veins of left lower extremity: Secondary | ICD-10-CM | POA: Diagnosis not present

## 2023-05-02 DIAGNOSIS — F1994 Other psychoactive substance use, unspecified with psychoactive substance-induced mood disorder: Secondary | ICD-10-CM | POA: Diagnosis not present

## 2023-05-02 DIAGNOSIS — M79605 Pain in left leg: Secondary | ICD-10-CM | POA: Diagnosis not present

## 2023-05-03 DIAGNOSIS — F1994 Other psychoactive substance use, unspecified with psychoactive substance-induced mood disorder: Secondary | ICD-10-CM | POA: Diagnosis not present

## 2023-05-04 DIAGNOSIS — F1410 Cocaine abuse, uncomplicated: Secondary | ICD-10-CM | POA: Diagnosis not present

## 2023-05-04 DIAGNOSIS — R45851 Suicidal ideations: Secondary | ICD-10-CM | POA: Diagnosis not present

## 2023-05-04 DIAGNOSIS — F1090 Alcohol use, unspecified, uncomplicated: Secondary | ICD-10-CM | POA: Diagnosis not present

## 2023-05-04 DIAGNOSIS — F17210 Nicotine dependence, cigarettes, uncomplicated: Secondary | ICD-10-CM | POA: Diagnosis not present

## 2023-05-04 DIAGNOSIS — M79662 Pain in left lower leg: Secondary | ICD-10-CM | POA: Diagnosis not present

## 2023-05-04 DIAGNOSIS — Y909 Presence of alcohol in blood, level not specified: Secondary | ICD-10-CM | POA: Diagnosis not present

## 2023-05-04 DIAGNOSIS — F10220 Alcohol dependence with intoxication, uncomplicated: Secondary | ICD-10-CM | POA: Diagnosis not present

## 2023-05-04 DIAGNOSIS — Z5900 Homelessness unspecified: Secondary | ICD-10-CM | POA: Diagnosis not present

## 2023-05-04 DIAGNOSIS — Z5321 Procedure and treatment not carried out due to patient leaving prior to being seen by health care provider: Secondary | ICD-10-CM | POA: Diagnosis not present

## 2023-05-04 DIAGNOSIS — Y929 Unspecified place or not applicable: Secondary | ICD-10-CM | POA: Diagnosis not present

## 2023-05-04 DIAGNOSIS — T45516A Underdosing of anticoagulants, initial encounter: Secondary | ICD-10-CM | POA: Diagnosis not present

## 2023-05-04 DIAGNOSIS — M79605 Pain in left leg: Secondary | ICD-10-CM | POA: Diagnosis not present

## 2023-05-04 DIAGNOSIS — F10920 Alcohol use, unspecified with intoxication, uncomplicated: Secondary | ICD-10-CM | POA: Diagnosis not present

## 2023-05-04 DIAGNOSIS — F1910 Other psychoactive substance abuse, uncomplicated: Secondary | ICD-10-CM | POA: Diagnosis not present

## 2023-05-04 DIAGNOSIS — Z91148 Patient's other noncompliance with medication regimen for other reason: Secondary | ICD-10-CM | POA: Diagnosis not present

## 2023-05-04 DIAGNOSIS — I82432 Acute embolism and thrombosis of left popliteal vein: Secondary | ICD-10-CM | POA: Diagnosis not present

## 2023-05-20 ENCOUNTER — Emergency Department
Admission: EM | Admit: 2023-05-20 | Discharge: 2023-05-20 | Disposition: A | Discharge status: Home / Self Care | Payer: 59 | Attending: Emergency Medicine | Admitting: Emergency Medicine

## 2023-05-20 ENCOUNTER — Other Ambulatory Visit: Payer: Self-pay

## 2023-05-20 ENCOUNTER — Encounter: Payer: Self-pay | Admitting: Emergency Medicine

## 2023-05-20 DIAGNOSIS — Z5900 Homelessness unspecified: Secondary | ICD-10-CM | POA: Diagnosis not present

## 2023-05-20 DIAGNOSIS — I10 Essential (primary) hypertension: Secondary | ICD-10-CM | POA: Insufficient documentation

## 2023-05-20 DIAGNOSIS — Z79899 Other long term (current) drug therapy: Secondary | ICD-10-CM | POA: Diagnosis not present

## 2023-05-20 DIAGNOSIS — Y906 Blood alcohol level of 120-199 mg/100 ml: Secondary | ICD-10-CM | POA: Diagnosis not present

## 2023-05-20 DIAGNOSIS — F1010 Alcohol abuse, uncomplicated: Secondary | ICD-10-CM | POA: Diagnosis not present

## 2023-05-20 DIAGNOSIS — F1090 Alcohol use, unspecified, uncomplicated: Secondary | ICD-10-CM | POA: Insufficient documentation

## 2023-05-20 LAB — COMPREHENSIVE METABOLIC PANEL
ALT: 41 U/L (ref 0–44)
AST: 28 U/L (ref 15–41)
Albumin: 3.7 g/dL (ref 3.5–5.0)
Alkaline Phosphatase: 95 U/L (ref 38–126)
Anion gap: 13 (ref 5–15)
BUN: 11 mg/dL (ref 6–20)
CO2: 21 mmol/L — ABNORMAL LOW (ref 22–32)
Calcium: 8.4 mg/dL — ABNORMAL LOW (ref 8.9–10.3)
Chloride: 100 mmol/L (ref 98–111)
Creatinine, Ser: 0.79 mg/dL (ref 0.61–1.24)
GFR, Estimated: >60 mL/min (ref >60)
Glucose, Bld: 139 mg/dL — ABNORMAL HIGH (ref 70–99)
Potassium: 3.3 mmol/L — ABNORMAL LOW (ref 3.5–5.1)
Sodium: 134 mmol/L — ABNORMAL LOW (ref 135–145)
Total Bilirubin: 0.5 mg/dL (ref <1.2)
Total Protein: 6.8 g/dL (ref 6.5–8.1)

## 2023-05-20 LAB — CBC
HCT: 43.6 % (ref 39.0–52.0)
Hemoglobin: 14.6 g/dL (ref 13.0–17.0)
MCH: 28.7 pg (ref 26.0–34.0)
MCHC: 33.5 g/dL (ref 30.0–36.0)
MCV: 85.8 fL (ref 80.0–100.0)
Platelets: 197 10*3/uL (ref 150–400)
RBC: 5.08 MIL/uL (ref 4.22–5.81)
RDW: 14.2 % (ref 11.5–15.5)
WBC: 7.4 10*3/uL (ref 4.0–10.5)
nRBC: 0 % (ref 0.0–0.2)

## 2023-05-20 LAB — URINE DRUG SCREEN, QUALITATIVE (ARMC ONLY)
Amphetamines, Ur Screen: NOT DETECTED
Barbiturates, Ur Screen: NOT DETECTED
Benzodiazepine, Ur Scrn: NOT DETECTED
Cannabinoid 50 Ng, Ur ~~LOC~~: POSITIVE — AB
Cocaine Metabolite,Ur ~~LOC~~: POSITIVE — AB
MDMA (Ecstasy)Ur Screen: NOT DETECTED
Methadone Scn, Ur: NOT DETECTED
Opiate, Ur Screen: NOT DETECTED
Phencyclidine (PCP) Ur S: NOT DETECTED
Tricyclic, Ur Screen: NOT DETECTED

## 2023-05-20 LAB — ETHANOL: Alcohol, Ethyl (B): 120 mg/dL — ABNORMAL HIGH (ref <10)

## 2023-05-20 MED ORDER — FOLIC ACID 1 MG PO TABS
1.0000 mg | ORAL_TABLET | Freq: Every day | ORAL | Status: DC
  Administered 2023-05-20: 1 mg via ORAL
  Filled 2023-05-20: qty 1

## 2023-05-20 MED ORDER — ADULT MULTIVITAMIN W/MINERALS CH
1.0000 | ORAL_TABLET | Freq: Every day | ORAL | Status: DC
  Administered 2023-05-20: 1 via ORAL
  Filled 2023-05-20: qty 1

## 2023-05-20 MED ORDER — THIAMINE MONONITRATE 100 MG PO TABS
100.0000 mg | ORAL_TABLET | Freq: Every day | ORAL | Status: DC
  Administered 2023-05-20: 100 mg via ORAL
  Filled 2023-05-20: qty 1

## 2023-05-20 MED ORDER — LORAZEPAM 1 MG PO TABS
1.0000 mg | ORAL_TABLET | ORAL | Status: DC | PRN
Start: 2023-05-20 — End: 2023-05-21
  Administered 2023-05-20: 1 mg via ORAL
  Filled 2023-05-20: qty 1

## 2023-05-20 MED ORDER — CHLORDIAZEPOXIDE HCL 25 MG PO CAPS
ORAL_CAPSULE | ORAL | 0 refills | Status: AC
  Filled 2023-05-20: qty 30, 6d supply, fill #0

## 2023-05-20 MED ORDER — THIAMINE HCL 100 MG/ML IJ SOLN: 100.0000 mg | Freq: Every day | INTRAMUSCULAR | Status: DC

## 2023-05-20 MED ORDER — LORAZEPAM 1 MG PO TABS: 1.0000 mg | ORAL_TABLET | ORAL | Status: DC | PRN

## 2023-05-20 NOTE — ED Notes (Signed)
Pt reports he needs alcohol detox.  Pt reports smoking marijuana yesterday and it made him feel funny.  Pt states I think someone laced it with drugs.  Pt denies SI or HI.  Pt denies other drug use.  Pt calm and cooperative.  Pt alert.

## 2023-05-20 NOTE — ED Triage Notes (Signed)
[place] [facility] [place]. States he normally drinks 8-10 beers/wine a day. Has drank 4 beers today with the last drink around 11am. Also smoked some weed last PM and thinks it was laced- states he thinks it made him feel different. NAD noted, steady gait in triage. PT states he does not have a home at this time.

## 2023-05-20 NOTE — Discharge Instructions (Signed)
You can pick up your medication and take it from the pharmacy as scheduled.

## 2023-05-20 NOTE — ED Provider Notes (Signed)
[facility] Provider Note    Event Date/Time   First MD Initiated Contact with Patient 05/20/23 1625     (approximate)   History   Detox   HPI Lowe Mcbratney is a 42 y.o. male with cocaine abuse, alcohol abuse, HTN presenting today for detox.  Patient states he normally drinks 8-10 beers/wine per day.  Last drink today around 11 AM.  States that he is also homeless.  Patient denies any history of withdrawal seizures or hospitalizations for detox.  Denies any other recent illnesses.     Physical Exam   Triage Vital Signs: ED Triage Vitals [05/20/23 1409]  Encounter Vitals Group     BP (!) 125/93     Systolic BP Percentile      Diastolic BP Percentile      Pulse Rate (!) 106     Resp 18     Temp 98.2 F (36.8 C)     Temp Source Oral     SpO2 93 %     Weight 190 lb (86.2 kg)     Height 6\' 1"  (1.854 m)     Head Circumference      Peak Flow      Pain Score 7     Pain Loc      Pain Education      Exclude from Growth Chart     Most recent vital signs: Vitals:   05/20/23 1738 05/20/23 1830  BP: (!) 131/101 (!) 145/98  Pulse: 92 92  Resp: 18   Temp: 98.2 F (36.8 C)   SpO2: 98% 97%   Physical Exam: I have reviewed the vital signs and nursing notes. General: Awake, alert, no acute distress.  Tremulous Head:  Atraumatic, normocephalic.   ENT:  EOM intact, PERRL. Oral mucosa is pink and moist with no lesions. Neck: Neck is supple with full range of motion, No meningeal signs. Cardiovascular: Mild tachycardia, RR, No murmurs. Peripheral pulses palpable and equal bilaterally. Respiratory:  Symmetrical chest wall expansion.  No rhonchi, rales, or wheezes.  Good air movement throughout.  No use of accessory muscles.   Musculoskeletal:  No cyanosis or edema. Moving extremities with full ROM Abdomen:  Soft, nontender, nondistended. Neuro:  GCS 15, moving all four extremities, interacting appropriately. Speech clear. Psych:  Calm, appropriate.    Skin:  Warm, dry, no rash.    ED Results / Procedures / Treatments   Labs (all labs ordered are listed, but only abnormal results are displayed) Labs Reviewed  COMPREHENSIVE METABOLIC PANEL - Abnormal; Notable for the following components:      Result Value   Sodium 134 (*)    Potassium 3.3 (*)    CO2 21 (*)    Glucose, Bld 139 (*)    Calcium 8.4 (*)    All other components within normal limits  ETHANOL - Abnormal; Notable for the following components:   Alcohol, Ethyl (B) 120 (*)    All other components within normal limits  URINE DRUG SCREEN, QUALITATIVE ([facility] ONLY) - Abnormal; Notable for the following components:   Cocaine Metabolite,Ur [place] POSITIVE (*)    Cannabinoid 50 Ng, Ur [place] POSITIVE (*)    All other components within normal limits  CBC     EKG    RADIOLOGY    PROCEDURES:  Critical Care performed: No  Procedures   MEDICATIONS ORDERED IN ED: Medications  LORazepam (ATIVAN) tablet 1-4 mg (1 mg Oral Given 05/20/23 1705)    Or  LORazepam (ATIVAN) tablet 1 mg ( Oral See Alternative 05/20/23 1705)  thiamine (VITAMIN B1) tablet 100 mg (100 mg Oral Given 05/20/23 1701)    Or  thiamine (VITAMIN B1) injection 100 mg ( Intravenous See Alternative 05/20/23 1701)  folic acid (FOLVITE) tablet 1 mg (1 mg Oral Given 05/20/23 1702)  multivitamin with minerals tablet 1 tablet (1 tablet Oral Given 05/20/23 1702)     IMPRESSION / MDM / ASSESSMENT AND PLAN / ED COURSE  I reviewed the triage vital signs and the nursing notes.                              Differential diagnosis includes, but is not limited to, alcohol detox  Patient's presentation is most consistent with acute complicated illness / injury requiring diagnostic workup.  Patient is a 42 year old male presenting today for alcohol detox.  Last use alcohol today.  Mild tremulous on exam with mild tachycardia but otherwise reassuring.  Laboratory workup reassuring at this time.  Alcohol level at 120.   Patient was given Ativan, folic acid, multivitamin, and thiamine.  He was provided with resources for outpatient detox services.  Patient was reassessed and feeling better at this time.  Will discharge with a Librium taper as needed.  Patient given strict return precautions.  The patient is on the cardiac monitor to evaluate for evidence of arrhythmia and/or significant heart rate changes.     FINAL CLINICAL IMPRESSION(S) / ED DIAGNOSES   Final diagnoses:  Alcohol use disorder     Rx / DC Orders   ED Discharge Orders          Ordered    chlordiazePOXIDE (LIBRIUM) 25 MG capsule  Multiple Frequencies        05/20/23 1905             Note:  This document was prepared using Dragon voice recognition software and may include unintentional dictation errors.   Janith Lima, MD 05/20/23 [phone]

## 2023-05-20 NOTE — BH Assessment (Signed)
TTS provided patient with resources for detox, inpatient and outpatient treatment. TTS explained [facility] does not offer inpatient detox. Patient verbalized understanding.

## 2023-05-21 ENCOUNTER — Other Ambulatory Visit: Payer: Self-pay

## 2023-05-21 DIAGNOSIS — I82442 Acute embolism and thrombosis of left tibial vein: Secondary | ICD-10-CM | POA: Diagnosis not present

## 2023-05-21 DIAGNOSIS — F419 Anxiety disorder, unspecified: Secondary | ICD-10-CM | POA: Diagnosis not present

## 2023-05-21 DIAGNOSIS — E876 Hypokalemia: Secondary | ICD-10-CM | POA: Diagnosis not present

## 2023-05-21 DIAGNOSIS — I82432 Acute embolism and thrombosis of left popliteal vein: Secondary | ICD-10-CM | POA: Diagnosis not present

## 2023-05-21 DIAGNOSIS — F1010 Alcohol abuse, uncomplicated: Secondary | ICD-10-CM | POA: Diagnosis not present

## 2023-05-21 DIAGNOSIS — E873 Alkalosis: Secondary | ICD-10-CM | POA: Diagnosis not present

## 2023-05-21 DIAGNOSIS — F1490 Cocaine use, unspecified, uncomplicated: Secondary | ICD-10-CM | POA: Diagnosis not present

## 2023-05-21 DIAGNOSIS — I82412 Acute embolism and thrombosis of left femoral vein: Secondary | ICD-10-CM | POA: Diagnosis not present

## 2023-05-21 DIAGNOSIS — I2694 Multiple subsegmental pulmonary emboli without acute cor pulmonale: Secondary | ICD-10-CM | POA: Diagnosis not present

## 2023-05-21 DIAGNOSIS — I82402 Acute embolism and thrombosis of unspecified deep veins of left lower extremity: Secondary | ICD-10-CM | POA: Diagnosis not present

## 2023-05-21 DIAGNOSIS — Z7901 Long term (current) use of anticoagulants: Secondary | ICD-10-CM | POA: Diagnosis not present

## 2023-05-21 DIAGNOSIS — Z79899 Other long term (current) drug therapy: Secondary | ICD-10-CM | POA: Diagnosis not present

## 2023-05-21 DIAGNOSIS — I82452 Acute embolism and thrombosis of left peroneal vein: Secondary | ICD-10-CM | POA: Diagnosis not present

## 2023-05-21 DIAGNOSIS — F10139 Alcohol abuse with withdrawal, unspecified: Secondary | ICD-10-CM | POA: Diagnosis not present

## 2023-05-21 DIAGNOSIS — R45851 Suicidal ideations: Secondary | ICD-10-CM | POA: Diagnosis not present

## 2023-05-21 DIAGNOSIS — F1910 Other psychoactive substance abuse, uncomplicated: Secondary | ICD-10-CM | POA: Diagnosis not present

## 2023-05-21 DIAGNOSIS — Z91148 Patient's other noncompliance with medication regimen for other reason: Secondary | ICD-10-CM | POA: Diagnosis not present

## 2023-05-21 DIAGNOSIS — E039 Hypothyroidism, unspecified: Secondary | ICD-10-CM | POA: Diagnosis not present

## 2023-05-21 DIAGNOSIS — Z5900 Homelessness unspecified: Secondary | ICD-10-CM | POA: Diagnosis not present

## 2023-05-21 DIAGNOSIS — I2699 Other pulmonary embolism without acute cor pulmonale: Secondary | ICD-10-CM | POA: Diagnosis not present

## 2023-05-21 DIAGNOSIS — Y904 Blood alcohol level of 80-99 mg/100 ml: Secondary | ICD-10-CM | POA: Diagnosis not present

## 2023-05-21 DIAGNOSIS — Z79890 Hormone replacement therapy (postmenopausal): Secondary | ICD-10-CM | POA: Diagnosis not present

## 2023-05-27 DIAGNOSIS — Z7901 Long term (current) use of anticoagulants: Secondary | ICD-10-CM | POA: Diagnosis not present

## 2023-05-27 DIAGNOSIS — F17210 Nicotine dependence, cigarettes, uncomplicated: Secondary | ICD-10-CM | POA: Diagnosis not present

## 2023-05-27 DIAGNOSIS — K2920 Alcoholic gastritis without bleeding: Secondary | ICD-10-CM | POA: Diagnosis not present

## 2023-05-27 DIAGNOSIS — K922 Gastrointestinal hemorrhage, unspecified: Secondary | ICD-10-CM | POA: Diagnosis not present

## 2023-05-27 DIAGNOSIS — K2901 Acute gastritis with bleeding: Secondary | ICD-10-CM | POA: Diagnosis not present

## 2023-05-27 DIAGNOSIS — Z5900 Homelessness unspecified: Secondary | ICD-10-CM | POA: Diagnosis not present

## 2023-05-27 DIAGNOSIS — Z5986 Financial insecurity: Secondary | ICD-10-CM | POA: Diagnosis not present

## 2023-05-27 DIAGNOSIS — R1110 Vomiting, unspecified: Secondary | ICD-10-CM | POA: Diagnosis not present

## 2023-05-27 DIAGNOSIS — R079 Chest pain, unspecified: Secondary | ICD-10-CM | POA: Diagnosis not present

## 2023-05-27 DIAGNOSIS — J189 Pneumonia, unspecified organism: Secondary | ICD-10-CM | POA: Diagnosis not present

## 2023-05-27 DIAGNOSIS — Z5948 Other specified lack of adequate food: Secondary | ICD-10-CM | POA: Diagnosis not present

## 2023-05-27 DIAGNOSIS — I82432 Acute embolism and thrombosis of left popliteal vein: Secondary | ICD-10-CM | POA: Diagnosis not present

## 2023-05-27 DIAGNOSIS — K920 Hematemesis: Secondary | ICD-10-CM | POA: Diagnosis not present

## 2023-05-27 DIAGNOSIS — F1010 Alcohol abuse, uncomplicated: Secondary | ICD-10-CM | POA: Diagnosis not present

## 2023-05-27 DIAGNOSIS — I2699 Other pulmonary embolism without acute cor pulmonale: Secondary | ICD-10-CM | POA: Diagnosis not present

## 2023-05-27 DIAGNOSIS — Z9049 Acquired absence of other specified parts of digestive tract: Secondary | ICD-10-CM | POA: Diagnosis not present

## 2023-05-27 DIAGNOSIS — Z5982 Transportation insecurity: Secondary | ICD-10-CM | POA: Diagnosis not present

## 2023-05-27 DIAGNOSIS — Z91148 Patient's other noncompliance with medication regimen for other reason: Secondary | ICD-10-CM | POA: Diagnosis not present

## 2023-05-27 DIAGNOSIS — K219 Gastro-esophageal reflux disease without esophagitis: Secondary | ICD-10-CM | POA: Diagnosis not present

## 2023-05-27 DIAGNOSIS — Z79899 Other long term (current) drug therapy: Secondary | ICD-10-CM | POA: Diagnosis not present

## 2023-05-27 DIAGNOSIS — F1111 Opioid abuse, in remission: Secondary | ICD-10-CM | POA: Diagnosis not present

## 2023-05-27 DIAGNOSIS — F1490 Cocaine use, unspecified, uncomplicated: Secondary | ICD-10-CM | POA: Diagnosis not present

## 2023-05-27 DIAGNOSIS — I82412 Acute embolism and thrombosis of left femoral vein: Secondary | ICD-10-CM | POA: Diagnosis not present

## 2023-05-27 DIAGNOSIS — R0789 Other chest pain: Secondary | ICD-10-CM | POA: Diagnosis not present

## 2023-05-27 DIAGNOSIS — Z634 Disappearance and death of family member: Secondary | ICD-10-CM | POA: Diagnosis not present

## 2023-05-27 DIAGNOSIS — E039 Hypothyroidism, unspecified: Secondary | ICD-10-CM | POA: Diagnosis not present

## 2023-05-27 DIAGNOSIS — Z79890 Hormone replacement therapy (postmenopausal): Secondary | ICD-10-CM | POA: Diagnosis not present

## 2023-05-28 DIAGNOSIS — K2901 Acute gastritis with bleeding: Secondary | ICD-10-CM | POA: Diagnosis not present

## 2023-05-29 DIAGNOSIS — K2901 Acute gastritis with bleeding: Secondary | ICD-10-CM | POA: Diagnosis not present

## 2023-05-29 DIAGNOSIS — J189 Pneumonia, unspecified organism: Secondary | ICD-10-CM | POA: Diagnosis not present

## 2023-05-29 DIAGNOSIS — K922 Gastrointestinal hemorrhage, unspecified: Secondary | ICD-10-CM | POA: Diagnosis not present

## 2023-05-29 DIAGNOSIS — I2699 Other pulmonary embolism without acute cor pulmonale: Secondary | ICD-10-CM | POA: Diagnosis not present

## 2023-05-30 DIAGNOSIS — I82412 Acute embolism and thrombosis of left femoral vein: Secondary | ICD-10-CM | POA: Diagnosis not present

## 2023-05-30 DIAGNOSIS — K922 Gastrointestinal hemorrhage, unspecified: Secondary | ICD-10-CM | POA: Diagnosis not present

## 2023-05-30 DIAGNOSIS — F17210 Nicotine dependence, cigarettes, uncomplicated: Secondary | ICD-10-CM | POA: Diagnosis not present

## 2023-05-30 DIAGNOSIS — R079 Chest pain, unspecified: Secondary | ICD-10-CM | POA: Diagnosis not present

## 2023-05-30 DIAGNOSIS — K2901 Acute gastritis with bleeding: Secondary | ICD-10-CM | POA: Diagnosis not present

## 2023-05-30 DIAGNOSIS — I2699 Other pulmonary embolism without acute cor pulmonale: Secondary | ICD-10-CM | POA: Diagnosis not present

## 2023-05-31 DIAGNOSIS — K2901 Acute gastritis with bleeding: Secondary | ICD-10-CM | POA: Diagnosis not present

## 2023-06-01 DIAGNOSIS — K2901 Acute gastritis with bleeding: Secondary | ICD-10-CM | POA: Diagnosis not present

## 2023-06-02 ENCOUNTER — Other Ambulatory Visit: Payer: Self-pay

## 2023-06-02 DIAGNOSIS — K2901 Acute gastritis with bleeding: Secondary | ICD-10-CM | POA: Diagnosis not present

## 2023-06-03 DIAGNOSIS — K2901 Acute gastritis with bleeding: Secondary | ICD-10-CM | POA: Diagnosis not present

## 2023-06-04 DIAGNOSIS — K922 Gastrointestinal hemorrhage, unspecified: Secondary | ICD-10-CM | POA: Diagnosis not present

## 2023-06-04 DIAGNOSIS — K2901 Acute gastritis with bleeding: Secondary | ICD-10-CM | POA: Diagnosis not present

## 2023-06-06 DIAGNOSIS — F10239 Alcohol dependence with withdrawal, unspecified: Secondary | ICD-10-CM | POA: Diagnosis not present

## 2023-06-06 DIAGNOSIS — Z5900 Homelessness unspecified: Secondary | ICD-10-CM | POA: Diagnosis not present

## 2023-06-06 DIAGNOSIS — F1910 Other psychoactive substance abuse, uncomplicated: Secondary | ICD-10-CM | POA: Diagnosis not present

## 2023-06-06 DIAGNOSIS — Y900 Blood alcohol level of less than 20 mg/100 ml: Secondary | ICD-10-CM | POA: Diagnosis not present

## 2023-06-06 DIAGNOSIS — Z0000 Encounter for general adult medical examination without abnormal findings: Secondary | ICD-10-CM | POA: Diagnosis not present

## 2023-06-07 DIAGNOSIS — F1994 Other psychoactive substance use, unspecified with psychoactive substance-induced mood disorder: Secondary | ICD-10-CM | POA: Diagnosis not present

## 2023-06-07 DIAGNOSIS — F1420 Cocaine dependence, uncomplicated: Secondary | ICD-10-CM | POA: Diagnosis not present

## 2023-06-07 DIAGNOSIS — E039 Hypothyroidism, unspecified: Secondary | ICD-10-CM | POA: Diagnosis not present

## 2023-06-07 DIAGNOSIS — F1110 Opioid abuse, uncomplicated: Secondary | ICD-10-CM | POA: Diagnosis not present

## 2023-06-07 DIAGNOSIS — F1910 Other psychoactive substance abuse, uncomplicated: Secondary | ICD-10-CM | POA: Diagnosis not present

## 2023-06-07 DIAGNOSIS — F1020 Alcohol dependence, uncomplicated: Secondary | ICD-10-CM | POA: Diagnosis not present

## 2023-06-07 DIAGNOSIS — Z5900 Homelessness unspecified: Secondary | ICD-10-CM | POA: Diagnosis not present

## 2023-06-08 DIAGNOSIS — F1110 Opioid abuse, uncomplicated: Secondary | ICD-10-CM | POA: Diagnosis not present

## 2023-06-08 DIAGNOSIS — Z5900 Homelessness unspecified: Secondary | ICD-10-CM | POA: Diagnosis not present

## 2023-06-08 DIAGNOSIS — F1420 Cocaine dependence, uncomplicated: Secondary | ICD-10-CM | POA: Diagnosis not present

## 2023-06-08 DIAGNOSIS — E039 Hypothyroidism, unspecified: Secondary | ICD-10-CM | POA: Diagnosis not present

## 2023-06-08 DIAGNOSIS — F1020 Alcohol dependence, uncomplicated: Secondary | ICD-10-CM | POA: Diagnosis not present

## 2023-06-08 DIAGNOSIS — F1994 Other psychoactive substance use, unspecified with psychoactive substance-induced mood disorder: Secondary | ICD-10-CM | POA: Diagnosis not present

## 2023-06-09 DIAGNOSIS — F1420 Cocaine dependence, uncomplicated: Secondary | ICD-10-CM | POA: Diagnosis not present

## 2023-06-09 DIAGNOSIS — F1994 Other psychoactive substance use, unspecified with psychoactive substance-induced mood disorder: Secondary | ICD-10-CM | POA: Diagnosis not present

## 2023-06-09 DIAGNOSIS — Z5900 Homelessness unspecified: Secondary | ICD-10-CM | POA: Diagnosis not present

## 2023-06-09 DIAGNOSIS — F1020 Alcohol dependence, uncomplicated: Secondary | ICD-10-CM | POA: Diagnosis not present

## 2023-06-09 DIAGNOSIS — F1110 Opioid abuse, uncomplicated: Secondary | ICD-10-CM | POA: Diagnosis not present

## 2023-06-09 DIAGNOSIS — E039 Hypothyroidism, unspecified: Secondary | ICD-10-CM | POA: Diagnosis not present

## 2023-06-10 DIAGNOSIS — F1020 Alcohol dependence, uncomplicated: Secondary | ICD-10-CM | POA: Diagnosis not present

## 2023-06-11 DIAGNOSIS — F1020 Alcohol dependence, uncomplicated: Secondary | ICD-10-CM | POA: Diagnosis not present
